# Patient Record
Sex: Male | Born: 1965 | Race: White | Hispanic: No | Smoking: Former smoker
Health system: Southern US, Community
[De-identification: ages and names within clinical notes are randomized; demographics above are authoritative.]

## PROBLEM LIST (undated history)

## (undated) DIAGNOSIS — K219 Gastro-esophageal reflux disease without esophagitis: Secondary | ICD-10-CM

## (undated) DIAGNOSIS — E119 Type 2 diabetes mellitus without complications: Secondary | ICD-10-CM

## (undated) DIAGNOSIS — Z9289 Personal history of other medical treatment: Secondary | ICD-10-CM

## (undated) DIAGNOSIS — E785 Hyperlipidemia, unspecified: Secondary | ICD-10-CM

## (undated) DIAGNOSIS — Z87891 Personal history of nicotine dependence: Secondary | ICD-10-CM

## (undated) DIAGNOSIS — R748 Abnormal levels of other serum enzymes: Secondary | ICD-10-CM

## (undated) DIAGNOSIS — I10 Essential (primary) hypertension: Secondary | ICD-10-CM

## (undated) DIAGNOSIS — I70219 Atherosclerosis of native arteries of extremities with intermittent claudication, unspecified extremity: Secondary | ICD-10-CM

## (undated) DIAGNOSIS — I70201 Unspecified atherosclerosis of native arteries of extremities, right leg: Secondary | ICD-10-CM

## (undated) DIAGNOSIS — K5190 Ulcerative colitis, unspecified, without complications: Secondary | ICD-10-CM

## (undated) DIAGNOSIS — N529 Male erectile dysfunction, unspecified: Secondary | ICD-10-CM

## (undated) DIAGNOSIS — I739 Peripheral vascular disease, unspecified: Secondary | ICD-10-CM

## (undated) HISTORY — PX: TONSILLECTOMY: SUR1361

## (undated) HISTORY — PX: FOOT SURGERY: SHX648

## (undated) HISTORY — PX: OTHER SURGICAL HISTORY: SHX169

## (undated) HISTORY — PX: COLONOSCOPY: SHX174

## (undated) HISTORY — PX: HERNIA REPAIR: SHX51

## (undated) HISTORY — PX: VASCULAR SURGERY: SHX849

---

## 2006-04-25 ENCOUNTER — Emergency Department: Payer: Self-pay | Admitting: Emergency Medicine

## 2008-02-07 ENCOUNTER — Ambulatory Visit: Payer: Self-pay | Admitting: Internal Medicine

## 2008-02-11 ENCOUNTER — Ambulatory Visit: Payer: Self-pay | Admitting: Family Medicine

## 2009-04-18 ENCOUNTER — Emergency Department: Payer: Self-pay | Admitting: Emergency Medicine

## 2011-09-20 ENCOUNTER — Ambulatory Visit: Payer: Self-pay | Admitting: Internal Medicine

## 2013-05-03 DIAGNOSIS — Z720 Tobacco use: Secondary | ICD-10-CM | POA: Insufficient documentation

## 2014-12-10 ENCOUNTER — Emergency Department
Admit: 2014-12-10 | Disposition: A | Discharge status: Home / Self Care | Payer: Self-pay | Admitting: Emergency Medicine

## 2015-10-02 ENCOUNTER — Emergency Department
Admission: EM | Admit: 2015-10-02 | Discharge: 2015-10-02 | Disposition: A | Discharge status: Home / Self Care | Payer: BC Managed Care – PPO | Attending: Emergency Medicine | Admitting: Emergency Medicine

## 2015-10-02 ENCOUNTER — Encounter: Payer: Self-pay | Admitting: Emergency Medicine

## 2015-10-02 DIAGNOSIS — M542 Cervicalgia: Secondary | ICD-10-CM | POA: Insufficient documentation

## 2015-10-02 DIAGNOSIS — F17200 Nicotine dependence, unspecified, uncomplicated: Secondary | ICD-10-CM | POA: Diagnosis not present

## 2015-10-02 DIAGNOSIS — E119 Type 2 diabetes mellitus without complications: Secondary | ICD-10-CM | POA: Insufficient documentation

## 2015-10-02 HISTORY — DX: Type 2 diabetes mellitus without complications: E11.9

## 2015-10-02 MED ORDER — PREDNISONE 10 MG (21) PO TBPK: ORAL_TABLET | ORAL | Status: DC

## 2015-10-02 MED ORDER — HYDROCODONE-ACETAMINOPHEN 5-325 MG PO TABS: 1.0000 | ORAL_TABLET | ORAL | Status: DC | PRN

## 2015-10-02 MED ORDER — CYCLOBENZAPRINE HCL 10 MG PO TABS: 10.0000 mg | ORAL_TABLET | Freq: Three times a day (TID) | ORAL | Status: DC | PRN
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
## 2017-03-05 DIAGNOSIS — IMO0002 Reserved for concepts with insufficient information to code with codable children: Secondary | ICD-10-CM | POA: Insufficient documentation

## 2018-01-05 DIAGNOSIS — N528 Other male erectile dysfunction: Secondary | ICD-10-CM | POA: Insufficient documentation

## 2018-01-05 DIAGNOSIS — E1142 Type 2 diabetes mellitus with diabetic polyneuropathy: Secondary | ICD-10-CM | POA: Insufficient documentation

## 2019-05-19 ENCOUNTER — Other Ambulatory Visit: Payer: Self-pay

## 2019-05-19 ENCOUNTER — Encounter (INDEPENDENT_AMBULATORY_CARE_PROVIDER_SITE_OTHER): Payer: Self-pay | Admitting: Vascular Surgery

## 2019-05-19 ENCOUNTER — Ambulatory Visit (INDEPENDENT_AMBULATORY_CARE_PROVIDER_SITE_OTHER): Payer: BC Managed Care – PPO | Admitting: Vascular Surgery

## 2019-05-19 VITALS — BP 130/81 | HR 94 | Resp 16 | Ht 68.0 in | Wt 164.4 lb

## 2019-05-19 DIAGNOSIS — E785 Hyperlipidemia, unspecified: Secondary | ICD-10-CM | POA: Insufficient documentation

## 2019-05-19 DIAGNOSIS — E782 Mixed hyperlipidemia: Secondary | ICD-10-CM

## 2019-05-19 DIAGNOSIS — I743 Embolism and thrombosis of arteries of the lower extremities: Secondary | ICD-10-CM | POA: Diagnosis not present

## 2019-05-19 DIAGNOSIS — I70221 Atherosclerosis of native arteries of extremities with rest pain, right leg: Secondary | ICD-10-CM

## 2019-05-19 DIAGNOSIS — E1142 Type 2 diabetes mellitus with diabetic polyneuropathy: Secondary | ICD-10-CM

## 2019-05-19 DIAGNOSIS — I70229 Atherosclerosis of native arteries of extremities with rest pain, unspecified extremity: Secondary | ICD-10-CM | POA: Insufficient documentation

## 2019-05-19 DIAGNOSIS — E1169 Type 2 diabetes mellitus with other specified complication: Secondary | ICD-10-CM | POA: Insufficient documentation

## 2019-05-19 NOTE — Progress Notes (Signed)
MRN : [id number]  Justin Adkins is a 53 y.o. (Jan 11, 1966) male who presents with chief complaint of  Chief Complaint  Patient presents with  . New Patient (Initial Visit)    ref White for right calf pain  .  History of Present Illness:  The patient is seen for evaluation of painful right lower extremity. Patient notes the pain is always worse with activity and is very consistent day today. Typically, the pain occurs at less than one block, progress is as activity continues to the point that the patient must stop walking. Resting including standing still for several minutes allowed resumption of the activity and the ability to walk a similar distance before stopping again. Uneven terrain and inclined shorten the distance. The pain has been progressive over the past several years. The patient states the inability to walk is now having a profound negative impact on quality of life and daily activities.  The patient denies rest pain or dangling of an extremity off the side of the bed during the night for relief. No open wounds or sores at this time. No prior interventions or surgeries.  No history of back problems or DJD of the lumbar sacral spine.   The patient denies changes in claudication symptoms or new rest pain symptoms.  No new ulcers or wounds of the foot.  The patient's blood pressure has been stable and relatively well controlled. The patient denies amaurosis fugax or recent TIA symptoms. There are no recent neurological changes noted. The patient denies history of DVT, PE or superficial thrombophlebitis. The patient denies recent episodes of angina or shortness of breath.   Current Meds  Medication Sig  . aspirin EC 81 MG tablet Take by mouth.  Marland Kitchen atorvastatin (LIPITOR) 40 MG tablet Take 40 mg by mouth daily.  . dapagliflozin propanediol (FARXIGA) 10 MG TABS tablet Take by mouth.  . Insulin Glargine (LANTUS SOLOSTAR) 100 UNIT/ML Solostar Pen Inject into the skin daily.  .  Insulin Pen Needle (FIFTY50 PEN NEEDLES) 31G X 5 MM MISC Use as directed  . LEVEMIR FLEXTOUCH 100 UNIT/ML Pen INJECT 25 UNITS SUBCUTANEOUSLY NIGHTLY  . meloxicam (MOBIC) 15 MG tablet Take 15 mg by mouth daily.  . metFORMIN (GLUCOPHAGE) 1000 MG tablet Take 1,000 mg by mouth 2 (two) times daily with a meal.    Past Medical History:  Diagnosis Date  . Diabetes mellitus without complication [facility])     Past Surgical History:  Procedure Laterality Date  . TONSILLECTOMY      Social History Social History   Tobacco Use  . Smoking status: Heavy Tobacco Smoker  . Smokeless tobacco: Never Used  Substance Use Topics  . Alcohol use: Yes  . Drug use: No    Family History Family History  Problem Relation Age of Onset  . Cancer Father   . Heart attack Brother   No family history of bleeding/clotting disorders, porphyria or autoimmune disease   Allergies  Allergen Reactions  . Naproxen Anaphylaxis  . Liraglutide Nausea Only     REVIEW OF SYSTEMS (Negative unless checked)  Constitutional: [] Weight loss  [] Fever  [] Chills Cardiac: [] Chest pain   [] Chest pressure   [] Palpitations   [] Shortness of breath when laying flat   [] Shortness of breath with exertion. Vascular:  [x] Pain in legs with walking   [x] Pain in legs at rest  [] History of DVT   [] Phlebitis   [] Swelling in legs   [] Varicose veins   [] Non-healing ulcers Pulmonary:   [] Uses  home oxygen   [] Productive cough   [] Hemoptysis   [] Wheeze  [] COPD   [] Asthma Neurologic:  [] Dizziness   [] Seizures   [] History of stroke   [] History of TIA  [] Aphasia   [] Vissual changes   [] Weakness or numbness in arm   [] Weakness or numbness in leg Musculoskeletal:   [] Joint swelling   [] Joint pain   [] Low back pain Hematologic:  [] Easy bruising  [] Easy bleeding   [] Hypercoagulable state   [] Anemic Gastrointestinal:  [] Diarrhea   [] Vomiting  [] Gastroesophageal reflux/heartburn   [] Difficulty swallowing. Genitourinary:  [] Chronic kidney disease    [] Difficult urination  [] Frequent urination   [] Blood in urine Skin:  [] Rashes   [] Ulcers  Psychological:  [] History of anxiety   []  History of major depression.  Physical Examination  Vitals:   05/19/19 1454  BP: 130/81  Pulse: 94  Resp: 16  Weight: 164 lb 6.4 oz (74.6 kg)  Height: 5\' 8"  (1.727 m)   Body mass index is 25 kg/m. Gen: WD/WN, NAD Head: [employer]/AT, No temporalis wasting.  Ear/Nose/Throat: Hearing grossly intact, nares w/o erythema or drainage, poor dentition Eyes: PER, EOMI, sclera nonicteric.  Neck: Supple, no masses.  No bruit or JVD.  Pulmonary:  Good air movement, clear to auscultation bilaterally, no use of accessory muscles.  Cardiac: RRR, normal S1, S2, no Murmurs. Vascular: right foot is cool and mildly cyanotic with sluggish cap refill Vessel Right Left  Radial Palpable Palpable  PT Not Palpable Palpable  DP Not Palpable Palpable  Gastrointestinal: soft, non-distended. No guarding/no peritoneal signs.  Musculoskeletal: M/S 5/5 throughout.  No deformity or atrophy.  Neurologic: CN 2-12 intact. Pain and light touch intact in extremities.  Symmetrical.  Speech is fluent. Motor exam as listed above. Psychiatric: Judgment intact, Mood & affect appropriate for pt's clinical situation. Dermatologic: No rashes or ulcers noted.  No changes consistent with cellulitis. Lymph : No Cervical lymphadenopathy, no lichenification or skin changes of chronic lymphedema.  CBC No results found for: WBC, HGB, HCT, MCV, PLT  BMET No results found for: NA, K, CL, CO2, GLUCOSE, BUN, CREATININE, CALCIUM, GFRNONAA, GFRAA CrCl cannot be calculated (No successful lab value found.).  COAG No results found for: INR, PROTIME  Radiology No results found.   Assessment/Plan 1. Atherosclerosis of native artery of right lower extremity with rest pain (HCC) Recommend:  The patient has evidence of severe atherosclerotic changes of both lower extremities with right leg rest pain that is  associated with preulcerative changes and impending tissue loss of the right foot.  This represents a limb threatening ischemia and places the patient at the risk for right limb loss.  I will order a STAT CT angiogram to identify the problem and exclude the possibility of aneurysmal disease.  Once obtained I would move forward with conventional angiography.  Patient should undergo angiography of the right lower extremities with the hope for intervention for limb salvage.  The risks and benefits as well as the alternative therapies was discussed in detail with the patient.  All questions were answered.  Patient agrees to proceed with angiography.  The patient will follow up with me in the office after the procedure.   2. Mixed hyperlipidemia Continue statin as ordered and reviewed, no changes at this time   3. Type 2 diabetes mellitus with peripheral neuropathy (HCC) Continue hypoglycemic medications as already ordered, these medications have been reviewed and there are no changes at this time.  Hgb A1C to be monitored as already arranged by primary  service   4. 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

## 2019-05-20 ENCOUNTER — Ambulatory Visit
Admission: RE | Admit: 2019-05-20 | Discharge: 2019-05-20 | Disposition: A | Discharge status: Home / Self Care | Payer: BC Managed Care – PPO | Source: Ambulatory Visit | Attending: Vascular Surgery | Admitting: Vascular Surgery

## 2019-05-20 ENCOUNTER — Other Ambulatory Visit (INDEPENDENT_AMBULATORY_CARE_PROVIDER_SITE_OTHER): Payer: Self-pay | Admitting: Vascular Surgery

## 2019-05-20 ENCOUNTER — Telehealth (INDEPENDENT_AMBULATORY_CARE_PROVIDER_SITE_OTHER): Payer: Self-pay

## 2019-05-20 DIAGNOSIS — I70201 Unspecified atherosclerosis of native arteries of extremities, right leg: Secondary | ICD-10-CM | POA: Insufficient documentation

## 2019-05-20 DIAGNOSIS — I743 Embolism and thrombosis of arteries of the lower extremities: Secondary | ICD-10-CM | POA: Diagnosis not present

## 2019-05-20 LAB — POCT I-STAT CREATININE: Creatinine, Ser: 0.6 mg/dL — ABNORMAL LOW (ref 0.61–1.24)

## 2019-05-20 MED ORDER — IOHEXOL 350 MG/ML SOLN
125.0000 mL | Freq: Once | INTRAVENOUS | Status: AC | PRN
  Administered 2019-05-20: 125 mL via INTRAVENOUS

## 2019-05-20 NOTE — Telephone Encounter (Signed)
Spoke with the patient and he is scheduled with Dr. Delana Meyer for a RLE angio on 05/25/2019 with a 9:00 am arrival time to the [facility]. Patient will do his Covid testing on 05/23/2019 before 11:00 am at the [facility]. This will be mailed to the patient.

## 2019-05-23 ENCOUNTER — Other Ambulatory Visit: Payer: Self-pay

## 2019-05-23 ENCOUNTER — Other Ambulatory Visit
Admission: RE | Admit: 2019-05-23 | Discharge: 2019-05-23 | Disposition: A | Discharge status: Home / Self Care | Payer: BC Managed Care – PPO | Source: Ambulatory Visit | Attending: Vascular Surgery | Admitting: Vascular Surgery

## 2019-05-23 DIAGNOSIS — Z01812 Encounter for preprocedural laboratory examination: Secondary | ICD-10-CM | POA: Diagnosis not present

## 2019-05-23 DIAGNOSIS — Z20828 Contact with and (suspected) exposure to other viral communicable diseases: Secondary | ICD-10-CM | POA: Insufficient documentation

## 2019-05-23 LAB — SARS CORONAVIRUS 2 (TAT 6-24 HRS): SARS Coronavirus 2: NEGATIVE

## 2019-05-24 ENCOUNTER — Other Ambulatory Visit (INDEPENDENT_AMBULATORY_CARE_PROVIDER_SITE_OTHER): Payer: Self-pay | Admitting: Nurse Practitioner

## 2019-05-24 MED ORDER — CEFAZOLIN SODIUM-DEXTROSE 2-4 GM/100ML-% IV SOLN
2.0000 g | Freq: Once | INTRAVENOUS | Status: AC
  Administered 2019-05-25: 2 g via INTRAVENOUS

## 2019-05-25 ENCOUNTER — Other Ambulatory Visit: Payer: Self-pay

## 2019-05-25 ENCOUNTER — Encounter
Admission: RE | Disposition: A | Discharge status: Home / Self Care | Payer: Self-pay | Source: Ambulatory Visit | Attending: Vascular Surgery

## 2019-05-25 ENCOUNTER — Encounter: Payer: Self-pay | Admitting: *Deleted

## 2019-05-25 ENCOUNTER — Ambulatory Visit
Admission: RE | Admit: 2019-05-25 | Discharge: 2019-05-25 | Disposition: A | Discharge status: Home / Self Care | Payer: BC Managed Care – PPO | Source: Ambulatory Visit | Attending: Vascular Surgery | Admitting: Vascular Surgery

## 2019-05-25 DIAGNOSIS — I743 Embolism and thrombosis of arteries of the lower extremities: Secondary | ICD-10-CM | POA: Diagnosis not present

## 2019-05-25 DIAGNOSIS — Z794 Long term (current) use of insulin: Secondary | ICD-10-CM | POA: Diagnosis not present

## 2019-05-25 DIAGNOSIS — I70221 Atherosclerosis of native arteries of extremities with rest pain, right leg: Secondary | ICD-10-CM

## 2019-05-25 DIAGNOSIS — I70201 Unspecified atherosclerosis of native arteries of extremities, right leg: Secondary | ICD-10-CM

## 2019-05-25 DIAGNOSIS — E782 Mixed hyperlipidemia: Secondary | ICD-10-CM | POA: Insufficient documentation

## 2019-05-25 DIAGNOSIS — F17200 Nicotine dependence, unspecified, uncomplicated: Secondary | ICD-10-CM | POA: Diagnosis not present

## 2019-05-25 DIAGNOSIS — Z79899 Other long term (current) drug therapy: Secondary | ICD-10-CM | POA: Diagnosis not present

## 2019-05-25 DIAGNOSIS — Z7982 Long term (current) use of aspirin: Secondary | ICD-10-CM | POA: Insufficient documentation

## 2019-05-25 DIAGNOSIS — E1140 Type 2 diabetes mellitus with diabetic neuropathy, unspecified: Secondary | ICD-10-CM | POA: Diagnosis not present

## 2019-05-25 HISTORY — PX: LOWER EXTREMITY ANGIOGRAPHY: CATH118251

## 2019-05-25 LAB — GLUCOSE, CAPILLARY
Glucose-Capillary: 386 mg/dL — ABNORMAL HIGH (ref 70–99)
Glucose-Capillary: 327 mg/dL — ABNORMAL HIGH (ref 70–99)
Glucose-Capillary: 264 mg/dL — ABNORMAL HIGH (ref 70–99)
Glucose-Capillary: 170 mg/dL — ABNORMAL HIGH (ref 70–99)

## 2019-05-25 LAB — BASIC METABOLIC PANEL
Anion gap: 11 (ref 5–15)
BUN: 18 mg/dL (ref 6–20)
CO2: 23 mmol/L (ref 22–32)
Calcium: 9.5 mg/dL (ref 8.9–10.3)
Chloride: 102 mmol/L (ref 98–111)
Creatinine, Ser: 0.65 mg/dL (ref 0.61–1.24)
GFR calc Af Amer: >60 mL/min (ref >60)
GFR calc non Af Amer: >60 mL/min (ref >60)
Glucose, Bld: 378 mg/dL — ABNORMAL HIGH (ref 70–99)
Potassium: 4.5 mmol/L (ref 3.5–5.1)
Sodium: 136 mmol/L (ref 135–145)

## 2019-05-25 SURGERY — LOWER EXTREMITY ANGIOGRAPHY
Anesthesia: Moderate Sedation | Laterality: Right

## 2019-05-25 MED ORDER — FAMOTIDINE 20 MG PO TABS: 40.0000 mg | ORAL_TABLET | Freq: Once | ORAL | Status: DC | PRN

## 2019-05-25 MED ORDER — CEFAZOLIN SODIUM-DEXTROSE 2-4 GM/100ML-% IV SOLN
INTRAVENOUS | Status: AC
  Filled 2019-05-25: qty 100

## 2019-05-25 MED ORDER — ONDANSETRON HCL 4 MG/2ML IJ SOLN: 4.0000 mg | Freq: Four times a day (QID) | INTRAMUSCULAR | Status: DC | PRN

## 2019-05-25 MED ORDER — SODIUM CHLORIDE FLUSH 0.9 % IV SOLN
INTRAVENOUS | Status: AC
  Filled 2019-05-25: qty 10

## 2019-05-25 MED ORDER — SODIUM CHLORIDE 0.9% FLUSH: 3.0000 mL | Freq: Two times a day (BID) | INTRAVENOUS | Status: DC

## 2019-05-25 MED ORDER — MORPHINE SULFATE (PF) 4 MG/ML IV SOLN: 2.0000 mg | INTRAVENOUS | Status: DC | PRN

## 2019-05-25 MED ORDER — METHYLPREDNISOLONE SODIUM SUCC 125 MG IJ SOLR: 125.0000 mg | Freq: Once | INTRAMUSCULAR | Status: DC | PRN

## 2019-05-25 MED ORDER — MIDAZOLAM HCL 5 MG/5ML IJ SOLN
INTRAMUSCULAR | Status: AC
  Filled 2019-05-25: qty 5

## 2019-05-25 MED ORDER — CLOPIDOGREL BISULFATE 75 MG PO TABS
ORAL_TABLET | ORAL | Status: AC
  Administered 2019-05-25: 13:00:00 300 mg via ORAL
  Filled 2019-05-25: qty 4

## 2019-05-25 MED ORDER — HYDROMORPHONE HCL 1 MG/ML IJ SOLN: 1.0000 mg | Freq: Once | INTRAMUSCULAR | Status: DC | PRN

## 2019-05-25 MED ORDER — FENTANYL CITRATE (PF) 100 MCG/2ML IJ SOLN
INTRAMUSCULAR | Status: AC
  Filled 2019-05-25: qty 2

## 2019-05-25 MED ORDER — ACETAMINOPHEN 325 MG PO TABS: 650.0000 mg | ORAL_TABLET | ORAL | Status: DC | PRN

## 2019-05-25 MED ORDER — ATORVASTATIN CALCIUM 10 MG PO TABS: 10.0000 mg | ORAL_TABLET | Freq: Every day | ORAL | 1 refills | Status: DC

## 2019-05-25 MED ORDER — FENTANYL CITRATE (PF) 100 MCG/2ML IJ SOLN
INTRAMUSCULAR | Status: DC | PRN
  Administered 2019-05-25 (×2): 25 ug via INTRAVENOUS
  Administered 2019-05-25 (×2): 50 ug via INTRAVENOUS
  Administered 2019-05-25: 25 ug via INTRAVENOUS

## 2019-05-25 MED ORDER — HEPARIN SODIUM (PORCINE) 1000 UNIT/ML IJ SOLN
INTRAMUSCULAR | Status: DC | PRN
  Administered 2019-05-25: 5000 [IU] via INTRAVENOUS

## 2019-05-25 MED ORDER — INSULIN ASPART 100 UNIT/ML ~~LOC~~ SOLN
SUBCUTANEOUS | Status: AC
  Administered 2019-05-25: 12 [IU] via SUBCUTANEOUS
  Filled 2019-05-25: qty 1

## 2019-05-25 MED ORDER — MIDAZOLAM HCL 2 MG/ML PO SYRP: 8.0000 mg | ORAL_SOLUTION | Freq: Once | ORAL | Status: DC | PRN

## 2019-05-25 MED ORDER — APIXABAN 2.5 MG PO TABS: 2.5000 mg | ORAL_TABLET | Freq: Two times a day (BID) | ORAL | 4 refills | Status: DC

## 2019-05-25 MED ORDER — OXYCODONE HCL 5 MG PO TABS: 5.0000 mg | ORAL_TABLET | ORAL | Status: DC | PRN

## 2019-05-25 MED ORDER — ALTEPLASE 2 MG IJ SOLR
INTRAMUSCULAR | Status: DC | PRN
  Administered 2019-05-25: 6 mg

## 2019-05-25 MED ORDER — HEPARIN SODIUM (PORCINE) 1000 UNIT/ML IJ SOLN
INTRAMUSCULAR | Status: AC
  Filled 2019-05-25: qty 1

## 2019-05-25 MED ORDER — CLOPIDOGREL BISULFATE 300 MG PO TABS
300.0000 mg | ORAL_TABLET | ORAL | Status: AC
  Administered 2019-05-25: 13:00:00 300 mg via ORAL

## 2019-05-25 MED ORDER — SODIUM CHLORIDE 0.9 % IV SOLN: INTRAVENOUS | Status: DC

## 2019-05-25 MED ORDER — IODIXANOL 320 MG/ML IV SOLN
INTRAVENOUS | Status: DC | PRN
  Administered 2019-05-25: 12:00:00 55 mL via INTRA_ARTERIAL

## 2019-05-25 MED ORDER — MIDAZOLAM HCL 2 MG/2ML IJ SOLN
INTRAMUSCULAR | Status: DC | PRN
  Administered 2019-05-25 (×4): 1 mg via INTRAVENOUS
  Administered 2019-05-25: 2 mg via INTRAVENOUS

## 2019-05-25 MED ORDER — APIXABAN 2.5 MG PO TABS
2.5000 mg | ORAL_TABLET | ORAL | Status: AC
  Administered 2019-05-25: 13:00:00 2.5 mg via ORAL
  Filled 2019-05-25: qty 1

## 2019-05-25 MED ORDER — SODIUM CHLORIDE 0.9 % IV SOLN
INTRAVENOUS | Status: DC
  Administered 2019-05-25: 10:00:00 via INTRAVENOUS

## 2019-05-25 MED ORDER — CLOPIDOGREL BISULFATE 75 MG PO TABS: 75.0000 mg | ORAL_TABLET | Freq: Every day | ORAL | 11 refills | Status: DC

## 2019-05-25 MED ORDER — SODIUM CHLORIDE 0.9 % IV SOLN: 250.0000 mL | INTRAVENOUS | Status: DC | PRN

## 2019-05-25 MED ORDER — HYDRALAZINE HCL 20 MG/ML IJ SOLN: 5.0000 mg | INTRAMUSCULAR | Status: DC | PRN

## 2019-05-25 MED ORDER — INSULIN ASPART 100 UNIT/ML ~~LOC~~ SOLN
12.0000 [IU] | Freq: Once | SUBCUTANEOUS | Status: AC
  Administered 2019-05-25: 09:00:00 12 [IU] via SUBCUTANEOUS

## 2019-05-25 MED ORDER — LABETALOL HCL 5 MG/ML IV SOLN: 10.0000 mg | INTRAVENOUS | Status: DC | PRN

## 2019-05-25 MED ORDER — SODIUM CHLORIDE 0.9% FLUSH: 3.0000 mL | INTRAVENOUS | Status: DC | PRN

## 2019-05-25 MED ORDER — INSULIN ASPART 100 UNIT/ML ~~LOC~~ SOLN
12.0000 [IU] | Freq: Once | SUBCUTANEOUS | Status: AC
  Administered 2019-05-25: 10:00:00 12 [IU] via SUBCUTANEOUS

## 2019-05-25 MED ORDER — DIPHENHYDRAMINE HCL 50 MG/ML IJ SOLN: 50.0000 mg | Freq: Once | INTRAMUSCULAR | Status: DC | PRN

## 2019-05-25 SURGICAL SUPPLY — 24 items
BALLN LUTONIX 018 5X60X130 (BALLOONS) ×3
BALLN ULTRASCORE 014 3X40X150 (BALLOONS) ×3
BALLOON LUTONIX 018 5X60X130 (BALLOONS) ×1 IMPLANT
BALLOON ULTRSCRE 014 3X40X150 (BALLOONS) ×1 IMPLANT
CANISTER PENUMBRA ENGINE (MISCELLANEOUS) ×3 IMPLANT
CATH BEACON 5 .035 65 RIM TIP (CATHETERS) ×3 IMPLANT
CATH INDIGO CAT6 KIT (CATHETERS) ×3 IMPLANT
CATH INFUS 135CMX30CM (CATHETERS) ×3 IMPLANT
CATH PIG 70CM (CATHETERS) ×3 IMPLANT
CATH VERT 5FR 125CM (CATHETERS) ×3 IMPLANT
DEVICE PRESTO INFLATION (MISCELLANEOUS) ×3 IMPLANT
DEVICE STARCLOSE SE CLOSURE (Vascular Products) ×3 IMPLANT
DEVICE TORQUE .025-.038 (MISCELLANEOUS) ×3 IMPLANT
GLIDEWIRE ADV .035X260CM (WIRE) ×3 IMPLANT
NEEDLE ENTRY 21GA 7CM ECHOTIP (NEEDLE) ×3 IMPLANT
PACK ANGIOGRAPHY (CUSTOM PROCEDURE TRAY) ×3 IMPLANT
SET INTRO CAPELLA COAXIAL (SET/KITS/TRAYS/PACK) ×3 IMPLANT
SHEATH BRITE TIP 5FRX11 (SHEATH) ×3 IMPLANT
SHEATH PINNACLE MP 7F 45CM (SHEATH) ×3 IMPLANT
SYR MEDRAD MARK 7 150ML (SYRINGE) ×3 IMPLANT
TUBING CONTRAST HIGH PRESS 72 (TUBING) ×3 IMPLANT
VALVE HEMO TOUHY BORST Y (ADAPTER) ×3 IMPLANT
WIRE J 3MM .035X145CM (WIRE) ×3 IMPLANT
WIRE RUNTHROUGH .014X300CM (WIRE) ×3 IMPLANT

## 2019-05-25 NOTE — Discharge Instructions (Signed)

## 2019-05-25 NOTE — OR Nursing (Signed)
Pt ready for discharge at 1415 but delay due to waiting for post procedure discussion with Dr Delana Meyer.

## 2019-05-25 NOTE — OR Nursing (Signed)
Dr schnier notified of fsbs 386 order receved for 12 novolog with repeat fsbs in one hr

## 2019-05-25 NOTE — OR Nursing (Signed)
Dr Delana Meyer notified of fsbs 1 hr post insulin of 326. 12 units insulin ordered.

## 2019-05-25 NOTE — H&P (Signed)
[facility] History & Physical Update  The patient was interviewed and re-examined.  The patient's previous History and Physical has been reviewed and is unchanged.  There is no change in the plan of care. We plan to proceed with the scheduled procedure.  Hortencia Pilar, MD  05/25/2019, 10:47 AM

## 2019-05-25 NOTE — Op Note (Signed)
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 Introduction catheter into right lower extremity 3rd order catheter placement  2. Contrast injection right lower extremity for distal runoff  3. Percutaneous transluminal angioplasty right  popliteal arteries to 5 mm 4. Percutaneous transluminal angioplasty right anterior tibial artery to 3 mm  5. Infusion of 6 milligrams of TPA right popliteal artery              6.  Mechanical thrombectomy with the penumbra CAT 6 device right popliteal artery.              7.  Mechanical thrombectomy of the right tibioperoneal trunk and proximal peroneal with the penumbra CAT 6 device.              8.  Star close closure left common femoral arteriotomy  Anesthesia: Conscious sedation was administered under my direct supervision by the interventional radiology RN. IV Versed plus fentanyl were utilized. Continuous ECG, pulse oximetry and blood pressure was monitored throughout the entire procedure.  Conscious sedation was for a total of 1 hour 50 minutes.  Sheath: 6 French destination left common femoral retrograde  Contrast: 55 cc  Fluoroscopy Time: 12.9 minutes  Indications: Justin Adkins presents with increasing pain of the right lower extremity.  Physical examination showed ischemic changes to the great toe noninvasive studies demonstrated severe occlusive disease.  This suggests the patient is having limb threatening ischemia placing his foot at risk for limb loss.  CT angiography was performed given the possibility that this could be embolic which demonstrated normal aortic iliac arterial flow no evidence of  aneurysmal diseases.  The risks and benefits are reviewed all questions answered patient agrees to proceed.  Procedure:Justin Adkins is a 53 y.o. y.o. male who was identified and appropriate procedural time out was performed. The patient was then placed supine on the table and prepped and draped in the usual sterile fashion.   Ultrasound was placed in the sterile sleeve and the left groin was evaluated the left common femoral artery was echolucent and pulsatile indicating patency. Image was recorded for the permanent record and under real-time visualization a microneedle was inserted into the common femoral artery followed by the microwire and then the micro-sheath. A J-wire was then advanced through the micro-sheath and a 5 [place] sheath was then inserted over a J-wire.  Given that the CT angiogram demonstrated essentially normal aorta and iliac system I moved forward with crossing the aortic bifurcation.  J-wire was then advanced and a 5 French rim catheter was to cross the aortic bifurcation the catheter wire were advanced down into the right distal external iliac artery. Oblique view of the femoral bifurcation was then obtained and subsequently the wire was reintroduced and the pigtail catheter negotiated into the SFA representing third order catheter placement. Distal runoff was then performed.  Diagnostic interpretation: Images from the CT angiography of the distal right lower extremity distal runoff were not adequate given the occlusion of the popliteal noted the tibial anatomy was poorly visualized.  RAO view of the femoral bifurcation demonstrated the common femoral and the profunda femoris as well as the SFA were widely patent.  The mid popliteal demonstrated a 4 cm occlusion.  There is reconstitution of the distal popliteal but there is moderate disease within  the tibioperoneal trunk approximately 40 to 50%.  There is scattered disease throughout the posterior tibial although there are no  hemodynamically significant lesions noted.  The peroneal is somewhat small and does not collateralize particularly well distally.  Anterior tibial demonstrates greater than 70% stenosis at its origin with tandem lesions.  Distal to that the anterior tibial is widely patent and fills the dorsalis pedis and the pedal arch.  Posterior tibial fills the lateral and medial plantar vessels as well as the pedal arch.  Given these findings I elected to move forward with intervention for limb salvage.  5000 units of heparin was then given and allowed to circulate and a 6 [place] destination sheath was advanced up and over the bifurcation and positioned in the proximal superficial femoral artery  Angled catheter and advantage Glidewire were then negotiated down into the distal popliteal. Catheter was then advanced. Hand injection contrast demonstrated intraluminal positioning and the tibial anatomy in detail.    The wires then exchanged for a 0.014 wire and with a Touhy Borst 6 mg of TPA is reconstituted and then laced across the clot.  Approximately 30 minutes is then allowed for dwell time.  Follow-up imaging by hand-injection demonstrates there is now trickle flow through the occlusion.  I elected to prep the penumbra CAT 6 device on the field and several passes are now made with the CAT 6 through the popliteal.  Several large pieces of semi-organized thrombus are retrieved.  Follow-up imaging now demonstrates approximately 70 to 80% stenosis which is very focal approximately 10 to 15 mm in length.  A 5 mm x 60 mm Lutonix drug-eluting balloon is advanced across this lesion and inflated to 10 atm for 2 full minutes.  Follow-up imaging demonstrates less than 5% residual stenosis however there appears to be thrombus which has occluded the tibioperoneal trunk and peroneal.  The wire had been negotiated down the anterior tibial and I elected to treat this lesion a 3 mm x 40 mm ultra score balloon is advanced across the  proximal anterior tibial inflations to 10 atm for 1 minute.  Follow-up imaging demonstrates less than 15% residual stenosis with rapid flow of contrast and preservation of distal runoff.  Having successfully treated both the popliteal lesion as well as the anterior tibial I now pulled the wire back and with the aid of a angled catheter negotiated the wire into the peroneal.  Catheter is then advanced and hand-injection contrast confirms intraluminal positioning.  The penumbra CAT 6 is then advanced again down the wire and used to perform thrombectomy of the tibioperoneal trunk and peroneal.  Again several large pieces of semi-organized thrombus are retrieved.  Follow-up imaging now demonstrates wide patency of the tibioperoneal trunk peroneal and posterior tibial.  As noted above the plaque within the tibioperoneal trunk does not reach hemodynamic significance and therefore angioplasty at this level was not performed.  After review of these images the sheath is pulled into the left external iliac oblique of the common femoral is obtained and a Star close device deployed. There no immediate Complications.  Findings:  Images from the CT angiography of the distal right lower extremity distal runoff were not adequate given the occlusion of the popliteal noted the tibial anatomy was poorly visualized.  RAO view of the femoral bifurcation demonstrated the common femoral and the profunda femoris as well as the SFA were widely patent.  The mid popliteal demonstrated a 4 cm occlusion.  There is reconstitution of the distal popliteal  but there is moderate disease within the tibioperoneal trunk approximately 40 to 50%.  There is scattered disease throughout the posterior tibial although there are no hemodynamically significant lesions noted.  The peroneal is somewhat small and does not collateralize particularly well distally.  Anterior tibial demonstrates greater than 70% stenosis at its origin with tandem lesions.   Distal to that the anterior tibial is widely patent and fills the dorsalis pedis and the pedal arch.  Posterior tibial fills the lateral and medial plantar vessels as well as the pedal arch.  Following administration of TPA and mechanical thrombectomy of the popliteal there is now recanalization and uncovering a culprit lesion.  Following angioplasty of the popliteal to 5 mm there is now resolution of this lesion with less than 5% residual stenosis.  Following angioplasty anterior tibial now is in-line flow and looks quite nice with less than 15% residual stenosis  Catheter was then negotiated into the peroneal, additional third order catheter placement and mechanical thrombectomy performed of the tibioperoneal trunk and peroneal with successful recanalization and removal of the thromboembolism.  Summary: Successful recanalization right lower extremity for limb salvage   Disposition: Patient was taken to the recovery room in stable condition having tolerated the procedure well.  Belenda Cruise Schnier 05/25/2019,4:59 PM

## 2019-05-26 ENCOUNTER — Encounter: Payer: Self-pay | Admitting: Vascular Surgery

## 2019-05-31 ENCOUNTER — Other Ambulatory Visit (INDEPENDENT_AMBULATORY_CARE_PROVIDER_SITE_OTHER): Payer: Self-pay | Admitting: Nurse Practitioner

## 2019-05-31 ENCOUNTER — Telehealth (INDEPENDENT_AMBULATORY_CARE_PROVIDER_SITE_OTHER): Payer: Self-pay

## 2019-05-31 DIAGNOSIS — M79671 Pain in right foot: Secondary | ICD-10-CM

## 2019-05-31 DIAGNOSIS — I739 Peripheral vascular disease, unspecified: Secondary | ICD-10-CM | POA: Insufficient documentation

## 2019-05-31 NOTE — Telephone Encounter (Signed)
He should come in with a R leg arterial and an ABI.  Some pain is common after this procedure but with the patient's smoking history and increased pain, there is worry there could be an early re-stenosis.  He should see me or Schnier, this week

## 2019-06-02 ENCOUNTER — Other Ambulatory Visit: Payer: Self-pay

## 2019-06-02 ENCOUNTER — Ambulatory Visit (INDEPENDENT_AMBULATORY_CARE_PROVIDER_SITE_OTHER): Payer: BC Managed Care – PPO

## 2019-06-02 ENCOUNTER — Encounter (INDEPENDENT_AMBULATORY_CARE_PROVIDER_SITE_OTHER): Payer: Self-pay | Admitting: Nurse Practitioner

## 2019-06-02 ENCOUNTER — Ambulatory Visit (INDEPENDENT_AMBULATORY_CARE_PROVIDER_SITE_OTHER): Payer: BC Managed Care – PPO | Admitting: Nurse Practitioner

## 2019-06-02 VITALS — BP 136/81 | HR 73 | Resp 10 | Ht 68.0 in | Wt 167.0 lb

## 2019-06-02 DIAGNOSIS — I70221 Atherosclerosis of native arteries of extremities with rest pain, right leg: Secondary | ICD-10-CM | POA: Diagnosis not present

## 2019-06-02 DIAGNOSIS — E1142 Type 2 diabetes mellitus with diabetic polyneuropathy: Secondary | ICD-10-CM

## 2019-06-02 DIAGNOSIS — M79671 Pain in right foot: Secondary | ICD-10-CM | POA: Diagnosis not present

## 2019-06-02 DIAGNOSIS — Z720 Tobacco use: Secondary | ICD-10-CM

## 2019-06-02 DIAGNOSIS — E782 Mixed hyperlipidemia: Secondary | ICD-10-CM | POA: Diagnosis not present

## 2019-06-02 NOTE — Progress Notes (Signed)
SUBJECTIVE:  Patient ID: Justin Adkins, male    DOB: [birth year], 53 y.o.   MRN: [id number] Chief Complaint  Patient presents with  . Follow-up    HPI  [name] is a 53 y.o. male the presents today about a week after his right lower extremity angiogram complaining of continued foot pain.  The patient states that his foot pain is a stinging burning pain.  He states that it feels as if he is walking on a hard sharp gravel-like surface constantly.  He states that he had this pain prior to his angiogram and continues to have it since then.  The patient also endorses having pain in his right lower extremity after the angiogram.  He states that he was having extensive cramping pain within his calf especially with continued ambulation however that has resolved.  The patient also currently endorses having issues with controlling his blood glucose.  He has an upcoming appointment with an endocrinologist however his most recent A1c was 11.  He denies any fever, chills, nausea, vomiting or diarrhea.  The patient's noninvasive studies show a right ABI of 1.09 and a left of 1.27.  The patient has triphasic waveforms within the bilateral tibial arteries with strong toe waveforms bilaterally.  The patient's right TBI 0.93.  The patient also underwent a right lower extremity arterial duplex which reveals triphasic waveforms throughout the level of the right lower extremity.  Past Medical History:  Diagnosis Date  . Diabetes mellitus without complication [facility])     Past Surgical History:  Procedure Laterality Date  . LOWER EXTREMITY ANGIOGRAPHY Right 05/25/2019   Procedure: LOWER EXTREMITY ANGIOGRAPHY;  Surgeon: Renford Dills, MD;  Location: [facility] INVASIVE CV LAB;  Service: Cardiovascular;  Laterality: Right;  . TONSILLECTOMY      Social History   Socioeconomic History  . Marital status: Married    Spouse name: Not on file  . Number of children: Not on file  . Years of education: Not on file   . Highest education level: Not on file  Occupational History  . Not on file  Social Needs  . Financial resource strain: Not on file  . Food insecurity    Worry: Not on file    Inability: Not on file  . Transportation needs    Medical: Not on file    Non-medical: Not on file  Tobacco Use  . Smoking status: Heavy Tobacco Smoker    Packs/day: 1.00    Years: 35.00    Pack years: 35.00  . Smokeless tobacco: Never Used  Substance and Sexual Activity  . Alcohol use: Yes  . Drug use: No  . Sexual activity: Yes    Birth control/protection: None  Lifestyle  . Physical activity    Days per week: Not on file    Minutes per session: Not on file  . Stress: Not on file  Relationships  . Social Musician on phone: Not on file    Gets together: Not on file    Attends religious service: Not on file    Active member of club or organization: Not on file    Attends meetings of clubs or organizations: Not on file    Relationship status: Not on file  . Intimate partner violence    Fear of current or ex partner: Not on file    Emotionally abused: Not on file    Physically abused: Not on file    Forced sexual activity: Not  on file  Other Topics Concern  . Not on file  Social History Narrative  . Not on file    Family History  Problem Relation Age of Onset  . Cancer Father   . Heart attack Brother     Allergies  Allergen Reactions  . Naproxen Anaphylaxis  . Liraglutide Nausea Only     Review of Systems   Review of Systems: Negative Unless Checked Constitutional: Weight loss  Fever  Chills Cardiac: Chest pain    Atrial Fibrillation  Palpitations   Shortness of breath when laying flat   Shortness of breath with exertion. Shortness of breath at rest Vascular:  Pain in legs with walking   Pain in legs with standing Pain in legs when laying flat   Claudication    Pain in feet when laying flat    History of DVT   Phlebitis   Swelling in  legs   Varicose veins   Non-healing ulcers Pulmonary:   Uses home oxygen   Productive cough   Hemoptysis   Wheeze  COPD   Asthma Neurologic:  Dizziness   Seizures  Blackouts History of stroke   History of TIA  Aphasia   Temporary Blindness   Weakness or numbness in arm   Weakness or numbness in leg Musculoskeletal:   Joint swelling   Joint pain   Low back pain   History of Knee Replacement Arthritis back Surgeries   Spinal Stenosis    Hematologic:  Easy bruising  Easy bleeding   Hypercoagulable state   Anemic Gastrointestinal:  Diarrhea   Vomiting  Gastroesophageal reflux/heartburn   Difficulty swallowing. Abdominal pain Genitourinary:  Chronic kidney disease   Difficult urination  Anuric   Blood in urine Frequent urination  Burning with urination   Hematuria Skin:  Rashes   Ulcers Wounds Psychological:  History of anxiety    History of major depression   Memory Difficulties      OBJECTIVE:   Physical Exam  BP 136/81 (BP Location: Left Arm, Patient Position: Sitting, Cuff Size: Normal)   Pulse 73   Resp 10   Ht  (1.727 m)   Wt 167 lb (75.8 kg)   BMI 25.39 kg/m   Gen: WD/WN, NAD Head: [place]/AT, No temporalis wasting.  Ear/Nose/Throat: Hearing grossly intact, nares w/o erythema or drainage Eyes: PER, EOMI, sclera nonicteric.  Neck: Supple, no masses.  No JVD.  Pulmonary:  Good air movement, no use of accessory muscles.  Cardiac: RRR Vascular:  Varicosities and stasis dermatitis right lower extremity Vessel Right Left  Dorsalis Pedis Palpable Palpable  Posterior Tibial Palpable Palpable   Gastrointestinal: soft, non-distended. No guarding/no peritoneal signs.  Musculoskeletal: M/S 5/5 throughout.  No deformity or atrophy.  Neurologic: Pain and light touch intact in extremities.  Symmetrical.  Speech is fluent. Motor exam as listed above. Psychiatric: Judgment intact, Mood & affect  appropriate for pt's clinical situation. Dermatologic: No Venous rashes. No Ulcers Noted.  No changes consistent with cellulitis. Lymph : No Cervical lymphadenopathy, no lichenification or skin changes of chronic lymphedema.       ASSESSMENT AND PLAN:  1. Atherosclerosis of native artery of right lower extremity with rest pain (HCC) The patient's noninvasive studies show a right ABI of 1.09 and a left of 1.27.  The patient has triphasic waveforms within the bilateral tibial arteries with strong toe waveforms bilaterally.  The patient's right TBI 0.93.  The patient also underwent a right lower extremity arterial duplex which reveals triphasic waveforms  throughout the level of the right lower extremity.    Recommend:  The patient has evidence of atherosclerosis of the lower extremities with claudication.  The patient does not voice lifestyle limiting changes at this point in time.  Noninvasive studies do not suggest clinically significant change.  No invasive studies, angiography or surgery at this time The patient should continue walking and begin a more formal exercise program.  The patient should continue antiplatelet therapy and aggressive treatment of the lipid abnormalities  No changes in the patient's medications at this time  The patient should continue wearing graduated compression socks 10-15 mmHg strength to control the mild edema.   - VAS [place] ABI WITH/WO TBI; Future - VAS [place] LOWER EXTREMITY ARTERIAL DUPLEX; Future  2. Type 2 diabetes mellitus with peripheral neuropathy (HCC) Based upon the patient's description of pain as well as uncontrolled diabetes, it is highly suspicious that the patient's pain is caused by neuropathy.  The patient also had a previous diagnosis of neuropathy.  Currently his peripheral arterial disease after his intervention show excellent blood flow therefore the pain is not related to ischemia.  I have suggested the patient that he should contact his  primary care provider and discuss possible further tests for neuropathy or about beginning medication therapy.  3. Tobacco abuse Smoking cessation was discussed, 3-10 minutes spent on this topic specifically   4. Mixed hyperlipidemia Continue statin as ordered and reviewed, no changes at this time    Current Outpatient Medications on File Prior to Visit  Medication Sig Dispense Refill  . apixaban (ELIQUIS) 2.5 MG TABS tablet Take 1 tablet (2.5 mg total) by mouth 2 (two) times daily. 60 tablet 4  . atorvastatin (LIPITOR) 10 MG tablet Take 1 tablet (10 mg total) by mouth daily. Additional refills per patient's primary care provider 30 tablet 1  . clopidogrel (PLAVIX) 75 MG tablet Take 1 tablet (75 mg total) by mouth daily. 30 tablet 11  . dapagliflozin propanediol (FARXIGA) 5 MG TABS tablet Take 5 mg by mouth daily.     . metFORMIN (GLUCOPHAGE) 1000 MG tablet Take 1,000 mg by mouth 2 (two) times daily with a meal.    . Insulin Pen Needle (FIFTY50 PEN NEEDLES) 31G X 5 MM MISC Use as directed    . NOVOLIN N 100 UNIT/ML injection      No current facility-administered medications on file prior to visit.     There are no Patient Instructions on file for this visit. No follow-ups on file.   Kris Hartmann, NP  This note was completed with Sales executive.  Any errors are purely unintentional.

## 2019-06-13 ENCOUNTER — Other Ambulatory Visit: Payer: BC Managed Care – PPO

## 2019-06-20 ENCOUNTER — Encounter (INDEPENDENT_AMBULATORY_CARE_PROVIDER_SITE_OTHER): Payer: BC Managed Care – PPO

## 2019-06-20 ENCOUNTER — Ambulatory Visit (INDEPENDENT_AMBULATORY_CARE_PROVIDER_SITE_OTHER): Payer: BC Managed Care – PPO | Admitting: Vascular Surgery

## 2019-08-06 ENCOUNTER — Other Ambulatory Visit (INDEPENDENT_AMBULATORY_CARE_PROVIDER_SITE_OTHER): Payer: Self-pay | Admitting: Vascular Surgery

## 2019-08-08 NOTE — Telephone Encounter (Signed)
Please advise refill? 

## 2019-08-08 NOTE — Telephone Encounter (Signed)
Patient on 40 mg Lipitor with PCP. Decline

## 2019-09-08 ENCOUNTER — Encounter (INDEPENDENT_AMBULATORY_CARE_PROVIDER_SITE_OTHER): Payer: BC Managed Care – PPO

## 2019-09-08 ENCOUNTER — Ambulatory Visit (INDEPENDENT_AMBULATORY_CARE_PROVIDER_SITE_OTHER): Payer: BC Managed Care – PPO | Admitting: Vascular Surgery

## 2019-09-26 ENCOUNTER — Ambulatory Visit (INDEPENDENT_AMBULATORY_CARE_PROVIDER_SITE_OTHER): Payer: BC Managed Care – PPO

## 2019-09-26 ENCOUNTER — Other Ambulatory Visit: Payer: Self-pay

## 2019-09-26 ENCOUNTER — Ambulatory Visit (INDEPENDENT_AMBULATORY_CARE_PROVIDER_SITE_OTHER): Payer: BC Managed Care – PPO | Admitting: Vascular Surgery

## 2019-09-26 ENCOUNTER — Encounter (INDEPENDENT_AMBULATORY_CARE_PROVIDER_SITE_OTHER): Payer: Self-pay | Admitting: Vascular Surgery

## 2019-09-26 VITALS — BP 128/85 | HR 69 | Resp 12 | Ht 68.0 in | Wt 184.0 lb

## 2019-09-26 DIAGNOSIS — I70221 Atherosclerosis of native arteries of extremities with rest pain, right leg: Secondary | ICD-10-CM

## 2019-09-26 DIAGNOSIS — E1169 Type 2 diabetes mellitus with other specified complication: Secondary | ICD-10-CM | POA: Diagnosis not present

## 2019-09-26 DIAGNOSIS — E1142 Type 2 diabetes mellitus with diabetic polyneuropathy: Secondary | ICD-10-CM

## 2019-09-26 DIAGNOSIS — E785 Hyperlipidemia, unspecified: Secondary | ICD-10-CM

## 2019-09-26 NOTE — Progress Notes (Signed)
MRN : [id number]  Justin Adkins is a 54 y.o. (May 31, 1966) male who presents with chief complaint of  Chief Complaint  Patient presents with  . Follow-up    U/S Follow up  .  History of Present Illness:   The patient returns to the office for followup and review status post angiogram with intervention 05/25/2019.   Procedure performed:             1. Percutaneous transluminal angioplasty right  popliteal arteries to 5 mm 2. Percutaneous transluminal angioplasty right anterior tibial artery to 3 mm  3. Infusion of 6 milligrams of TPA right popliteal artery             4.  Mechanical thrombectomy with the penumbra CAT 6 device right popliteal artery.             5.  Mechanical thrombectomy of the right tibioperoneal trunk and proximal peroneal with the penumbra CAT 6 device.  The patient notes improvement in the lower extremity symptoms. No interval shortening of the patient's claudication distance or rest pain symptoms. Previous wounds have now healed.  No new ulcers or wounds have occurred since the last visit.  There have been no significant changes to the patient's overall health care.  The patient denies amaurosis fugax or recent TIA symptoms. There are no recent neurological changes noted. The patient denies history of DVT, PE or superficial thrombophlebitis. The patient denies recent episodes of angina or shortness of breath.   ABI's Rt=0.93 and Lt=1.24; The patient has triphasic waveforms  (previous ABI's Rt=1.09 and Lt=1.27; The patient has triphasic waveforms)  Duplex US of the right lower extremity arterial system shows widely patent arterial system       Current Meds  Medication Sig  . apixaban (ELIQUIS) 2.5 MG TABS tablet Take 1 tablet (2.5 mg total) by mouth 2 (two) times daily.  Marland Kitchen atorvastatin (LIPITOR) 10 MG tablet Take 1 tablet (10 mg total) by mouth daily. Additional refills per patient's primary care provider  . Blood Glucose  Monitoring Suppl (ONETOUCH VERIO REFLECT) w/Device KIT USE TO TEST BLOOD GLUCOSE  . Blood Glucose Monitoring Suppl (ONETOUCH VERIO) w/Device KIT Use to test blood glucose  . clopidogrel (PLAVIX) 75 MG tablet Take 1 tablet (75 mg total) by mouth daily.  . dapagliflozin propanediol (FARXIGA) 5 MG TABS tablet Take 5 mg by mouth daily.   . ergocalciferol (VITAMIN D2) 1.25 MG (50000 UT) capsule Take by mouth.  . Insulin Pen Needle (FIFTY50 PEN NEEDLES) 31G X 5 MM MISC Use as directed  . NOVOLIN N 100 UNIT/ML injection   . pregabalin (LYRICA) 75 MG capsule Take by mouth.  . Semaglutide,0.25 or 0.5MG/DOS, 2 MG/1.5ML SOPN Inject into the skin.    Past Medical History:  Diagnosis Date  . Diabetes mellitus without complication [facility])     Past Surgical History:  Procedure Laterality Date  . LOWER EXTREMITY ANGIOGRAPHY Right 05/25/2019   Procedure: LOWER EXTREMITY ANGIOGRAPHY;  Surgeon: Katha Cabal, MD;  Location: [place] CV LAB;  Service: Cardiovascular;  Laterality: Right;  . TONSILLECTOMY      Social History Social History   Tobacco Use  . Smoking status: Current Every Day Smoker    Packs/day: 1.00    Years: 35.00    Pack years: 35.00  . Smokeless tobacco: Never Used  Substance Use Topics  . Alcohol use: Yes  . Drug use: No    Family History Family History  Problem Relation Age of  Onset  . Cancer Father   . Heart attack Brother     Allergies  Allergen Reactions  . Naproxen Anaphylaxis  . Liraglutide Nausea Only     REVIEW OF SYSTEMS (Negative unless checked)  Constitutional: []Weight loss  []Fever  []Chills Cardiac: []Chest pain   []Chest pressure   []Palpitations   []Shortness of breath when laying flat   []Shortness of breath with exertion. Vascular:  []Pain in legs with walking   []Pain in legs at rest  []History of DVT   []Phlebitis   []Swelling in legs   []Varicose veins   []Non-healing ulcers Pulmonary:   []Uses home oxygen   []Productive cough    []Hemoptysis   []Wheeze  []COPD   []Asthma Neurologic:  []Dizziness   []Seizures   []History of stroke   []History of TIA  []Aphasia   []Vissual changes   []Weakness or numbness in arm   []Weakness or numbness in leg Musculoskeletal:   []Joint swelling   [x]Joint pain   []Low back pain Hematologic:  []Easy bruising  []Easy bleeding   []Hypercoagulable state   []Anemic Gastrointestinal:  []Diarrhea   []Vomiting  []Gastroesophageal reflux/heartburn   []Difficulty swallowing. Genitourinary:  []Chronic kidney disease   []Difficult urination  []Frequent urination   []Blood in urine Skin:  []Rashes   []Ulcers  Psychological:  []History of anxiety   [] History of major depression.  Physical Examination  Vitals:   09/26/19 0846  BP: 128/85  Pulse: 69  Resp: 12  Weight: 184 lb (83.5 kg)  Height: 5' 8" (1.727 m)   Body mass index is 27.98 kg/m. Gen: WD/WN, NAD Head: [place]/AT, No temporalis wasting.  Ear/Nose/Throat: Hearing grossly intact, nares w/o erythema or drainage Eyes: PER, EOMI, sclera nonicteric.  Neck: Supple, no large masses.   Pulmonary:  Good air movement, no audible wheezing bilaterally, no use of accessory muscles.  Cardiac: RRR, no JVD Vascular:  Right foot in a splint Vessel Right Left  Radial Palpable Palpable  Gastrointestinal: Non-distended. No guarding/no peritoneal signs.  Musculoskeletal: M/S 5/5 throughout.  No deformity or atrophy.  Neurologic: CN 2-12 intact. Symmetrical.  Speech is fluent. Motor exam as listed above. Psychiatric: Judgment intact, Mood & affect appropriate for pt's clinical situation. Dermatologic: No rashes or ulcers noted.  No changes consistent with cellulitis. Lymph : No lichenification or skin changes of chronic lymphedema.  CBC No results found for: WBC, HGB, HCT, MCV, PLT  BMET    Component Value Date/Time   NA 136 05/25/2019 0923   K 4.5 05/25/2019 0923   CL 102 05/25/2019 0923   CO2 23 05/25/2019 0923   GLUCOSE 378 (H) 05/25/2019  0923   BUN 18 05/25/2019 0923   CREATININE 0.65 05/25/2019 0923   CALCIUM 9.5 05/25/2019 0923   GFRNONAA >60 05/25/2019 0923   GFRAA >60 05/25/2019 0923   CrCl cannot be calculated (Patient's most recent lab result is older than the maximum 21 days allowed.).  COAG No results found for: INR, PROTIME  Radiology No results found.   Assessment/Plan 1. Atherosclerosis of native artery of right lower extremity with rest pain [facility]) Recommend:  The patient is status post successful angiogram with intervention.  His rest pain symptoms have now resolved and he is walking almost pain-free.  He does have a Morton's neuroma of the right foot which is causing him some difficulty.  We have discussed that he can be off his antiplatelet agents after mid April (6 months status post intervention) and that  he would restart his antiplatelet agents 48 hours after surgery if that is indeed necessary.  Also very important the patient has quit smoking since his angio.  The patient reports that the claudication symptoms and leg pain is essentially gone.   The patient denies lifestyle limiting changes at this point in time.  No further invasive studies, angiography or surgery at this time The patient should continue walking and begin a more formal exercise program.  The patient should continue antiplatelet therapy and aggressive treatment of the lipid abnormalities  Smoking cessation was again discussed  Patient should undergo noninvasive studies as ordered. The patient will follow up with me after the studies.    2. Type 2 diabetes mellitus with peripheral neuropathy ([place]) Continue hypoglycemic medications as already ordered, these medications have been reviewed and there are no changes at this time.  Hgb A1C to be monitored as already arranged by primary service   3. Hyperlipidemia associated with type 2 diabetes mellitus ([place]) Continue statin as ordered and reviewed, no changes at this  time    Hortencia Pilar, MD  09/26/2019 9:00 AM

## 2019-11-01 ENCOUNTER — Emergency Department: Payer: BC Managed Care – PPO

## 2019-11-01 ENCOUNTER — Emergency Department
Admission: EM | Admit: 2019-11-01 | Discharge: 2019-11-01 | Disposition: A | Discharge status: Home / Self Care | Payer: BC Managed Care – PPO | Attending: Emergency Medicine | Admitting: Emergency Medicine

## 2019-11-01 ENCOUNTER — Other Ambulatory Visit: Payer: Self-pay

## 2019-11-01 ENCOUNTER — Encounter: Payer: Self-pay | Admitting: Emergency Medicine

## 2019-11-01 DIAGNOSIS — K5900 Constipation, unspecified: Secondary | ICD-10-CM | POA: Diagnosis present

## 2019-11-01 DIAGNOSIS — Z87891 Personal history of nicotine dependence: Secondary | ICD-10-CM | POA: Insufficient documentation

## 2019-11-01 DIAGNOSIS — E1151 Type 2 diabetes mellitus with diabetic peripheral angiopathy without gangrene: Secondary | ICD-10-CM | POA: Diagnosis not present

## 2019-11-01 DIAGNOSIS — Z794 Long term (current) use of insulin: Secondary | ICD-10-CM | POA: Diagnosis not present

## 2019-11-01 DIAGNOSIS — K5903 Drug induced constipation: Secondary | ICD-10-CM

## 2019-11-01 DIAGNOSIS — E119 Type 2 diabetes mellitus without complications: Secondary | ICD-10-CM | POA: Diagnosis not present

## 2019-11-01 DIAGNOSIS — Z7901 Long term (current) use of anticoagulants: Secondary | ICD-10-CM | POA: Insufficient documentation

## 2019-11-01 MED ORDER — DOCUSATE SODIUM 100 MG PO CAPS: 100.0000 mg | ORAL_CAPSULE | Freq: Two times a day (BID) | ORAL | 2 refills | Status: DC

## 2019-11-01 MED ORDER — SENNA 8.6 MG PO TABS: 1.0000 | ORAL_TABLET | Freq: Every day | ORAL | 0 refills | Status: DC

## 2019-11-01 MED ORDER — DOCUSATE SODIUM 50 MG/5ML PO LIQD
50.0000 mg | Freq: Once | ORAL | Status: AC
  Administered 2019-11-01: 50 mg
  Filled 2019-11-01 (×2): qty 10

## 2019-11-01 NOTE — ED Notes (Signed)
md in with pt for disimpaction

## 2019-11-01 NOTE — ED Notes (Signed)
Report off to gracie  rn  

## 2019-11-01 NOTE — ED Triage Notes (Addendum)
Pt presents to ED with constipation for the past 3 days with rectal pressure. Has taken stool softeners and miralax with no relief. Pt states his last bowel movement was hard and small amount. Current symptoms have  occurred 2 other times this past month. Denies abd pain.

## 2019-11-01 NOTE — ED Provider Notes (Signed)
[facility] Emergency Department Provider Note  ____________________________________________  Time seen: Approximately 3:50 AM  I have reviewed the triage vital signs and the nursing notes.   HISTORY  Chief Complaint Constipation   HPI Justin Adkins is a 54 y.o. male with a history of diabetes, PVD on Plavix, hyperlipidemia, smoking who presents for evaluation of constipation.   Patient reports that he was started on Ozempic for his diabetes about a month and a half ago.  Since then he has had 3 episodes of constipation.  This one started 3 days ago.  Has not had any bowel movement since then.  No nausea or vomiting.  No abdominal pain.  Patient is feeling a lot of rectal pressure which has become more severe this evening.  Patient has no family history of colon cancer.  Had a colonoscopy 6 months ago which was unremarkable.  No abdominal surgeries.  He is passing flatus.  No abdominal distention.  Past Medical History:  Diagnosis Date  . Diabetes mellitus without complication [facility])     Patient Active Problem List   Diagnosis Date Noted  . PVD (peripheral vascular disease) ([place]) 05/31/2019  . Popliteal artery occlusion, right ([place]) 05/20/2019  . Atherosclerosis of native arteries of extremity with rest pain ([place]) 05/19/2019  . Hyperlipidemia 05/19/2019  . Hyperlipidemia associated with type 2 diabetes mellitus ([place]) 05/19/2019  . Type 2 diabetes mellitus with peripheral neuropathy ([place]) 01/05/2018  . Other male erectile dysfunction 01/05/2018  . Total or mature cataract 03/05/2017  . Tobacco abuse 05/03/2013    Past Surgical History:  Procedure Laterality Date  . HERNIA REPAIR    . LOWER EXTREMITY ANGIOGRAPHY Right 05/25/2019   Procedure: LOWER EXTREMITY ANGIOGRAPHY;  Surgeon: Katha Cabal, MD;  Location: [place] [facility];  Service: Cardiovascular;  Laterality: Right;  . TONSILLECTOMY      Prior to Admission medications   Medication Sig  Start Date End Date Taking? Authorizing Provider  apixaban (ELIQUIS) 2.5 MG TABS tablet Take 1 tablet (2.5 mg total) by mouth 2 (two) times daily. 05/25/19   Schnier, Dolores Lory, MD  atorvastatin (LIPITOR) 10 MG tablet Take 1 tablet (10 mg total) by mouth daily. Additional refills per patient's primary care provider 05/25/19 05/24/20  Schnier, Dolores Lory, MD  Blood Glucose Monitoring Suppl (ONETOUCH VERIO REFLECT) w/Device KIT USE TO TEST BLOOD GLUCOSE 06/30/19   [provider]  Blood Glucose Monitoring Suppl (ONETOUCH VERIO) w/Device KIT Use to test blood glucose 06/30/19   [provider]  clopidogrel (PLAVIX) 75 MG tablet Take 1 tablet (75 mg total) by mouth daily. 05/26/19   Schnier, Dolores Lory, MD  dapagliflozin propanediol (FARXIGA) 5 MG TABS tablet Take 5 mg by mouth daily.  05/12/19 05/11/20  [provider]  docusate sodium (COLACE) 100 MG capsule Take 1 capsule (100 mg total) by mouth 2 (two) times daily. 11/01/19 10/31/20  Rudene Re, MD  ergocalciferol (VITAMIN D2) 1.25 MG (50000 UT) capsule Take by mouth. 09/08/19   [provider]  Insulin Pen Needle (FIFTY50 PEN NEEDLES) 31G X 5 MM MISC Use as directed 01/07/19 01/07/20  [provider]  metFORMIN (GLUCOPHAGE) 1000 MG tablet Take 1,000 mg by mouth 2 (two) times daily with a meal. 01/07/19   [provider]  NOVOLIN N 100 UNIT/ML injection  05/27/19   [provider]  pregabalin (LYRICA) 75 MG capsule Take by mouth. 07/11/19 07/10/20  [provider]  senna (SENOKOT) 8.6 MG TABS tablet  Take 1 tablet (8.6 mg total) by mouth at bedtime. 11/01/19   Rudene Re, MD    Allergies Naproxen and Liraglutide  Family History  Problem Relation Age of Onset  . Cancer Father   . Heart attack Brother     Social History Social History   Tobacco Use  . Smoking status: Former Smoker    Packs/day: 1.00    Years: 35.00    Pack years: 35.00  . Smokeless tobacco: Never  Used  Substance Use Topics  . Alcohol use: Yes  . Drug use: No    Review of Systems  Constitutional: Negative for fever. Eyes: Negative for visual changes. ENT: Negative for sore throat. Neck: No neck pain  Cardiovascular: Negative for chest pain. Respiratory: Negative for shortness of breath. Gastrointestinal: Negative for abdominal pain, vomiting or diarrhea. + constipation Genitourinary: Negative for dysuria. Musculoskeletal: Negative for back pain. Skin: Negative for rash. Neurological: Negative for headaches, weakness or numbness. Psych: No SI or HI  ____________________________________________   PHYSICAL EXAM:  VITAL SIGNS: ED Triage Vitals  Enc Vitals Group     BP 11/01/19 0113 (!) 138/105     Pulse Rate 11/01/19 0113 (!) 108     Resp 11/01/19 0113 18     Temp 11/01/19 0113 98.5 F (36.9 C)     Temp Source 11/01/19 0113 Oral     SpO2 11/01/19 0113 100 %     Weight 11/01/19 0114 173 lb (78.5 kg)     Height 11/01/19 0114 '5\' 8"'  (1.727 m)     Head Circumference --      Peak Flow --      Pain Score 11/01/19 0113 0     Pain Loc --      Pain Edu? --      Excl. in [place]? --     Constitutional: Alert and oriented. Well appearing and in no apparent distress. HEENT:      Head: Normocephalic and atraumatic.         Eyes: Conjunctivae are normal. Sclera is non-icteric.       Mouth/Throat: Mucous membranes are moist.       Neck: Supple with no signs of meningismus. Cardiovascular: Regular rate and rhythm.  Respiratory: Normal respiratory effort.  Gastrointestinal: Soft, non tender, and non distended with positive bowel sounds. No rebound or guarding. Genitourinary: Rectal exam shows stool impaction and a small external hemorrhoid Musculoskeletal: No edema, cyanosis, or erythema of extremities. Neurologic: Normal speech and language. Face is symmetric. Moving all extremities. No gross focal neurologic deficits are appreciated. Skin: Skin is warm, dry and intact. No rash  noted. Psychiatric: Mood and affect are normal. Speech and behavior are normal.  ____________________________________________   LABS (all labs ordered are listed, but only abnormal results are displayed)  Labs Reviewed - No data to display ____________________________________________  EKG  none  ____________________________________________  RADIOLOGY  I have personally reviewed the images performed during this visit and I agree with the Radiologist's read.   Interpretation by Radiologist:  DG Abdomen 1 View  Result Date: 11/01/2019 CLINICAL DATA:  Constipation EXAM: ABDOMEN - 1 VIEW COMPARISON:  None. FINDINGS: The bowel gas pattern is normal. No radio-opaque calculi or other significant radiographic abnormality are seen. Moderate colonic stool volume. IMPRESSION: Moderate colonic stool volume. Electronically Signed   By: Ulyses Jarred M.D.   On: 11/01/2019 02:41     ____________________________________________   PROCEDURES  Procedure(s) performed:yes Procedures   ------------------------------------------------------------------------------------------------------------------- Fecal Disimpaction Procedure Note:  Performed by  me:  Patient placed in the lateral recumbent position with knees drawn towards chest. Nurse present for patient support. Large amount of hard brown stool removed. No complications during procedure.   ------------------------------------------------------------------------------------------------------------------   Critical Care performed:  None ____________________________________________   INITIAL IMPRESSION / ASSESSMENT AND PLAN / ED COURSE   54 y.o. male with a history of diabetes, PVD on Plavix, hyperlipidemia, smoking who presents for evaluation of constipation since being started on Ozempic for his diabetes.  No abdominal pain, no distention, is passing flatus, no abdominal tenderness, positive bowel sounds.  Patient is well-appearing  and in no distress.  Rectal exam showing stool impaction which was disimpacted per procedure note above.  X-ray showing moderate stool burden.  Review of patient's medical record shows recent colonoscopy 6 months ago which was within normal limits.  Clinically no signs of SBO.  Patient was given a soapsuds enema with good bowel movement and full resolution of rectal pressure.  Will start patient on daily senna and Colace to prevent recurrence of his constipation while on this medication.  Recommended that he discuss the side effects with his endocrinologist.  Discussed return precautions for any signs of SBO or any abdominal pain.  Otherwise patient is to follow-up with his primary care doctor.      _____________________________________________ Please note:  Patient was evaluated in Emergency Department today for the symptoms described in the history of present illness. Patient was evaluated in the context of the global COVID-19 pandemic, which necessitated consideration that the patient might be at risk for infection with the SARS-CoV-2 virus that causes COVID-19. Institutional protocols and algorithms that pertain to the evaluation of patients at risk for COVID-19 are in a state of rapid change based on information released by regulatory bodies including the CDC and federal and state organizations. These policies and algorithms were followed during the patient's care in the ED.  Some ED evaluations and interventions may be delayed as a result of limited staffing during the pandemic.   ____________________________________________   FINAL CLINICAL IMPRESSION(S) / ED DIAGNOSES   Final diagnoses:  Drug-induced constipation      NEW MEDICATIONS STARTED DURING THIS VISIT:  ED Discharge Orders         Ordered    senna (SENOKOT) 8.6 MG TABS tablet  Daily at bedtime     11/01/19 0313    docusate sodium (COLACE) 100 MG capsule  2 times daily     11/01/19 3154           Note:  This document  was prepared using Dragon voice recognition software and may include unintentional dictation errors.    Alfred Levins, [place], MD 11/01/19 [phone]

## 2019-11-01 NOTE — Discharge Instructions (Signed)
Constipation: Take colace twice a day everyday. Take senna once a day at bedtime. Take daily probiotics. Drink plenty of fluids and eat a diet rich in fiber. For the next 3 days take 3600mg  or 37ml of 400mg /40ml Milk of Magnesia at bedtime.  Return to the emergency room if you have abdominal pain, nausea or vomiting.  Otherwise follow-up with your primary care doctor in 2 to 3 days.

## 2019-11-01 NOTE — ED Notes (Signed)
Pt has constipation for 3 days.  Pt using otc meds without relief.  No abd pain. No vomiting.  Pt alert.

## 2019-12-28 ENCOUNTER — Other Ambulatory Visit (INDEPENDENT_AMBULATORY_CARE_PROVIDER_SITE_OTHER): Payer: Self-pay | Admitting: Vascular Surgery

## 2020-02-24 ENCOUNTER — Other Ambulatory Visit: Payer: BC Managed Care – PPO

## 2020-02-27 ENCOUNTER — Other Ambulatory Visit: Payer: Self-pay

## 2020-02-27 ENCOUNTER — Other Ambulatory Visit
Admission: RE | Admit: 2020-02-27 | Discharge: 2020-02-27 | Disposition: A | Discharge status: Home / Self Care | Payer: BC Managed Care – PPO | Source: Ambulatory Visit | Attending: Gastroenterology | Admitting: Gastroenterology

## 2020-02-27 DIAGNOSIS — Z20822 Contact with and (suspected) exposure to covid-19: Secondary | ICD-10-CM | POA: Diagnosis present

## 2020-02-27 LAB — SARS CORONAVIRUS 2 (TAT 6-24 HRS): SARS Coronavirus 2: NEGATIVE

## 2020-02-28 ENCOUNTER — Ambulatory Visit
Admission: RE | Admit: 2020-02-28 | Discharge: 2020-02-28 | Disposition: A | Discharge status: Home / Self Care | Payer: BC Managed Care – PPO | Attending: Gastroenterology | Admitting: Gastroenterology

## 2020-02-28 ENCOUNTER — Encounter: Payer: Self-pay | Admitting: *Deleted

## 2020-02-28 ENCOUNTER — Ambulatory Visit: Payer: BC Managed Care – PPO | Admitting: Anesthesiology

## 2020-02-28 ENCOUNTER — Encounter
Admission: RE | Disposition: A | Discharge status: Home / Self Care | Payer: Self-pay | Source: Home / Self Care | Attending: Gastroenterology

## 2020-02-28 DIAGNOSIS — K3189 Other diseases of stomach and duodenum: Secondary | ICD-10-CM | POA: Diagnosis not present

## 2020-02-28 DIAGNOSIS — Z79899 Other long term (current) drug therapy: Secondary | ICD-10-CM | POA: Insufficient documentation

## 2020-02-28 DIAGNOSIS — E1151 Type 2 diabetes mellitus with diabetic peripheral angiopathy without gangrene: Secondary | ICD-10-CM | POA: Diagnosis not present

## 2020-02-28 DIAGNOSIS — Z794 Long term (current) use of insulin: Secondary | ICD-10-CM | POA: Insufficient documentation

## 2020-02-28 DIAGNOSIS — R12 Heartburn: Secondary | ICD-10-CM | POA: Diagnosis present

## 2020-02-28 DIAGNOSIS — Z886 Allergy status to analgesic agent status: Secondary | ICD-10-CM | POA: Diagnosis not present

## 2020-02-28 DIAGNOSIS — K449 Diaphragmatic hernia without obstruction or gangrene: Secondary | ICD-10-CM | POA: Insufficient documentation

## 2020-02-28 DIAGNOSIS — Z7902 Long term (current) use of antithrombotics/antiplatelets: Secondary | ICD-10-CM | POA: Diagnosis not present

## 2020-02-28 DIAGNOSIS — K2970 Gastritis, unspecified, without bleeding: Secondary | ICD-10-CM | POA: Insufficient documentation

## 2020-02-28 DIAGNOSIS — K2100 Gastro-esophageal reflux disease with esophagitis, without bleeding: Secondary | ICD-10-CM | POA: Insufficient documentation

## 2020-02-28 DIAGNOSIS — Z7901 Long term (current) use of anticoagulants: Secondary | ICD-10-CM | POA: Diagnosis not present

## 2020-02-28 DIAGNOSIS — Z888 Allergy status to other drugs, medicaments and biological substances status: Secondary | ICD-10-CM | POA: Diagnosis not present

## 2020-02-28 HISTORY — PX: ESOPHAGOGASTRODUODENOSCOPY (EGD) WITH PROPOFOL: SHX5813

## 2020-02-28 HISTORY — DX: Peripheral vascular disease, unspecified: I73.9

## 2020-02-28 LAB — GLUCOSE, CAPILLARY: Glucose-Capillary: 97 mg/dL (ref 70–99)

## 2020-02-28 SURGERY — ESOPHAGOGASTRODUODENOSCOPY (EGD) WITH PROPOFOL
Anesthesia: General

## 2020-02-28 MED ORDER — LIDOCAINE HCL (PF) 1 % IJ SOLN
INTRAMUSCULAR | Status: AC
  Filled 2020-02-28: qty 2

## 2020-02-28 MED ORDER — SODIUM CHLORIDE 0.9 % IV SOLN: INTRAVENOUS | Status: DC

## 2020-02-28 MED ORDER — PROPOFOL 500 MG/50ML IV EMUL
INTRAVENOUS | Status: DC | PRN
  Administered 2020-02-28: 150 ug/kg/min via INTRAVENOUS

## 2020-02-28 NOTE — Op Note (Signed)
[facility] Gastroenterology Patient Name: Justin Adkins Procedure Date: 02/28/2020 12:30 PM MRN: [id number] Account #: [id number] Date of Birth: [birth year] Admit Type: Outpatient Age: 54 Room: [facility] ENDO ROOM 3 Gender: Male Note Status: Finalized Procedure:             Upper GI endoscopy Indications:           Heartburn Providers:             Andrey Farmer MD, MD Referring MD:          [facility] (Referring MD) Medicines:             Monitored Anesthesia Care Complications:         No immediate complications. Estimated blood loss:                         Minimal. Procedure:             Pre-Anesthesia Assessment:                        - Prior to the procedure, a History and Physical was                         performed, and patient medications and allergies were                         reviewed. The patient is competent. The risks and                         benefits of the procedure and the sedation options and                         risks were discussed with the patient. All questions                         were answered and informed consent was obtained.                         Patient identification and proposed procedure were                         verified by the physician, the nurse, the anesthetist                         and the technician in the endoscopy suite. Mental                         Status Examination: alert and oriented. Airway                         Examination: normal oropharyngeal airway and neck                         mobility. Respiratory Examination: clear to                         auscultation. CV Examination: normal. Prophylactic                         Antibiotics:  The patient does not require prophylactic                         antibiotics. Prior Anticoagulants: The patient has                         taken Eliquis (apixaban), last dose was 5 days prior                         to procedure. ASA Grade  Assessment: II - A patient                         with mild systemic disease. After reviewing the risks                         and benefits, the patient was deemed in satisfactory                         condition to undergo the procedure. The anesthesia                         plan was to use monitored anesthesia care (MAC).                         Immediately prior to administration of medications,                         the patient was re-assessed for adequacy to receive                         sedatives. The heart rate, respiratory rate, oxygen                         saturations, blood pressure, adequacy of pulmonary                         ventilation, and response to care were monitored                         throughout the procedure. The physical status of the                         patient was re-assessed after the procedure.                        - Prior Anticoagulants: The patient has taken Plavix                         (clopidogrel), last dose was 5 days prior to procedure.                        After obtaining informed consent, the endoscope was                         passed under direct vision. Throughout the procedure,                         the patient's blood pressure, pulse,  and oxygen                         saturations were monitored continuously. The Endoscope                         was introduced through the mouth, and advanced to the                         second part of duodenum. The upper GI endoscopy was                         accomplished without difficulty. The patient tolerated                         the procedure well. Findings:      LA Grade A (one or more mucosal breaks less than 5 mm, not extending       between tops of 2 mucosal folds) esophagitis with no bleeding was found.      A small hiatal hernia was present.      Localized mild inflammation characterized by erythema was found in the       gastric antrum. Biopsies were taken with a cold  forceps for Helicobacter       pylori testing. Estimated blood loss was minimal.      The examined duodenum was normal.      Localized mild mucosal variance characterized by erythema was found in       the ampulla. Biopsies were taken with a cold forceps for histology.       Estimated blood loss was minimal. Impression:            - LA Grade A reflux esophagitis with no bleeding.                        - Small hiatal hernia.                        - Gastritis. Biopsied.                        - Normal examined duodenum.                        - Mucosal variant in the duodenum. Biopsied. Recommendation:        - Discharge patient to home.                        - Resume previous diet.                        - Continue present medications.                        - Await pathology results.                        - Return to nurse practitioner as previously scheduled. Procedure Code(s):     --- Professional ---                        [phone], Esophagogastroduodenoscopy, flexible,  transoral; with biopsy, single or multiple Diagnosis Code(s):     --- Professional ---                        K21.00, Gastro-esophageal reflux disease with                         esophagitis, without bleeding                        K44.9, Diaphragmatic hernia without obstruction or                         gangrene                        K29.70, Gastritis, unspecified, without bleeding                        K31.89, Other diseases of stomach and duodenum                        R12, Heartburn CPT copyright 2019 American Medical Association. All rights reserved. The codes documented in this report are preliminary and upon coder review may  be revised to meet current compliance requirements. Andrey Farmer, MD Andrey Farmer MD, MD 02/28/2020 12:49:51 PM Number of Addenda: 0 Note Initiated On: 02/28/2020 12:30 PM Estimated Blood Loss:  Estimated blood loss was minimal.      [facility]

## 2020-02-28 NOTE — Interval H&P Note (Signed)
History and Physical Interval Note:  02/28/2020 12:21 PM  Justin Adkins  has presented today for surgery, with the diagnosis of GERD.  The various methods of treatment have been discussed with the patient and family. After consideration of risks, benefits and other options for treatment, the patient has consented to  Procedure(s): ESOPHAGOGASTRODUODENOSCOPY (EGD) WITH PROPOFOL (N/A) as a surgical intervention.  The patient's history has been reviewed, patient examined, no change in status, stable for surgery.  I have reviewed the patient's chart and labs.  Questions were answered to the patient's satisfaction.     Regis Bill  Ok to proceed with EGD/

## 2020-02-28 NOTE — Transfer of Care (Signed)
Immediate Anesthesia Transfer of Care Note  Patient: Justin Adkins  Procedure(s) Performed: ESOPHAGOGASTRODUODENOSCOPY (EGD) WITH PROPOFOL (N/A )  Patient Location: PACU  Anesthesia Type:General  Level of Consciousness: awake and sedated  Airway & Oxygen Therapy: Patient Spontanous Breathing and Patient connected to nasal cannula oxygen  Post-op Assessment: Report given to RN and Post -op Vital signs reviewed and stable  Post vital signs: Reviewed and stable  Last Vitals:  Vitals Value Taken Time  BP    Temp    Pulse    Resp    SpO2      Last Pain:  Vitals:   02/28/20 1159  TempSrc: Temporal         Complications: No complications documented.

## 2020-02-28 NOTE — Anesthesia Preprocedure Evaluation (Signed)
Anesthesia Evaluation  Patient identified by MRN, date of birth, ID band Patient awake    Reviewed: Allergy & Precautions, NPO status , Patient's Chart, lab work & pertinent test results  History of Anesthesia Complications Negative for: history of anesthetic complications  Airway Mallampati: II  TM Distance: >3 FB Neck ROM: Full    Dental no notable dental hx. (+) Teeth Intact   Pulmonary neg pulmonary ROS, neg sleep apnea, neg COPD, Patient abstained from smoking.Not current smoker, former smoker,    Pulmonary exam normal breath sounds clear to auscultation       Cardiovascular Exercise Tolerance: Good METS(-) hypertension+ Peripheral Vascular Disease  (-) CAD and (-) Past MI (-) dysrhythmias  Rhythm:Regular Rate:Normal - Systolic murmurs    Neuro/Psych negative neurological ROS  negative psych ROS   GI/Hepatic neg GERD  ,(+)     (-) substance abuse  ,   Endo/Other  diabetes  Renal/GU negative Renal ROS     Musculoskeletal   Abdominal   Peds  Hematology   Anesthesia Other Findings Past Medical History: No date: Diabetes mellitus without complication (HCC) No date: Peripheral vascular disease (HCC)  Reproductive/Obstetrics                             Anesthesia Physical Anesthesia Plan  ASA: III  Anesthesia Plan: General   Post-op Pain Management:    Induction: Intravenous  PONV Risk Score and Plan: 3 and Ondansetron, Propofol infusion and TIVA  Airway Management Planned: Nasal Cannula  Additional Equipment: None  Intra-op Plan:   Post-operative Plan:   Informed Consent: I have reviewed the patients History and Physical, chart, labs and discussed the procedure including the risks, benefits and alternatives for the proposed anesthesia with the patient or authorized representative who has indicated his/her understanding and acceptance.     Dental advisory given  Plan  Discussed with: CRNA and Surgeon  Anesthesia Plan Comments: (Discussed risks of anesthesia with patient, including possibility of difficulty with spontaneous ventilation under anesthesia necessitating airway intervention, PONV, and rare risks such as cardiac or respiratory or neurological events. Patient understands.)        Anesthesia Quick Evaluation

## 2020-02-28 NOTE — Anesthesia Postprocedure Evaluation (Signed)
Anesthesia Post Note  Patient: Justin Adkins  Procedure(s) Performed: ESOPHAGOGASTRODUODENOSCOPY (EGD) WITH PROPOFOL (N/A )  Patient location during evaluation: Endoscopy Anesthesia Type: General Level of consciousness: awake and alert Pain management: pain level controlled Vital Signs Assessment: post-procedure vital signs reviewed and stable Respiratory status: spontaneous breathing, nonlabored ventilation, respiratory function stable and patient connected to nasal cannula oxygen Cardiovascular status: blood pressure returned to baseline and stable Postop Assessment: no apparent nausea or vomiting Anesthetic complications: no   No complications documented.   Last Vitals:  Vitals:   02/28/20 1306 02/28/20 1316  BP: 112/82 127/85  Pulse: 77 68  Resp: 16 13  Temp:    SpO2: 94% 98%    Last Pain:  Vitals:   02/28/20 1316  TempSrc:   PainSc: 0-No pain                 Corinda Gubler

## 2020-02-28 NOTE — Anesthesia Procedure Notes (Signed)
Performed by: Cook-Martin, Shiva Karis Pre-anesthesia Checklist: Patient identified, Emergency Drugs available, Suction available, Patient being monitored and Timeout performed Patient Re-evaluated:Patient Re-evaluated prior to induction Oxygen Delivery Method: Nasal cannula Preoxygenation: Pre-oxygenation with 100% oxygen Induction Type: IV induction Airway Equipment and Method: Bite block Placement Confirmation: positive ETCO2 and CO2 detector       

## 2020-02-28 NOTE — H&P (Signed)
Outpatient short stay form Pre-procedure 02/28/2020 12:16 PM Raylene Miyamoto MD, MPH  Primary Physician: [facility]  Reason for visit:  Dyspepsia/Heart Burn  History of present illness:  Has been having bloating/heart burn symptoms. On eliquis/plavix and has been held for 5 days both.     Current Facility-Administered Medications:  .  lidocaine (PF) (XYLOCAINE) 1 % injection, , , ,  .  0.9 %  sodium chloride infusion, , Intravenous, Continuous, Jarrell Armond, Hilton Cork, MD  Medications Prior to Admission  Medication Sig Dispense Refill Last Dose  . atorvastatin (LIPITOR) 10 MG tablet Take 1 tablet (10 mg total) by mouth daily. Additional refills per patient's primary care provider 30 tablet 1 02/27/2020 at Unknown time  . ergocalciferol (VITAMIN D2) 1.25 MG (50000 UT) capsule Take by mouth.   Past Week at Unknown time  . NOVOLIN N 100 UNIT/ML injection    02/27/2020 at Unknown time  . pregabalin (LYRICA) 75 MG capsule Take by mouth.   02/27/2020 at Unknown time  . Blood Glucose Monitoring Suppl (ONETOUCH VERIO REFLECT) w/Device KIT USE TO TEST BLOOD GLUCOSE     . Blood Glucose Monitoring Suppl (ONETOUCH VERIO) w/Device KIT Use to test blood glucose     . clopidogrel (PLAVIX) 75 MG tablet Take 1 tablet (75 mg total) by mouth daily. 30 tablet 11 02/22/2020  . dapagliflozin propanediol (FARXIGA) 5 MG TABS tablet Take 5 mg by mouth daily.      Marland Kitchen docusate sodium (COLACE) 100 MG capsule Take 1 capsule (100 mg total) by mouth 2 (two) times daily. 60 capsule 2   . ELIQUIS 2.5 MG TABS tablet TAKE 1 TABLET BY MOUTH TWICE A DAY 60 tablet 4 02/22/2020  . metFORMIN (GLUCOPHAGE) 1000 MG tablet Take 1,000 mg by mouth 2 (two) times daily with a meal. (Patient not taking: Reported on 02/28/2020)   Not Taking at Unknown time  . senna (SENOKOT) 8.6 MG TABS tablet Take 1 tablet (8.6 mg total) by mouth at bedtime. 120 tablet 0      Allergies  Allergen Reactions  . Naproxen Anaphylaxis  . Liraglutide  Nausea Only     Past Medical History:  Diagnosis Date  . Diabetes mellitus without complication ([place])   . Peripheral vascular disease ([place])     Review of systems:  Otherwise negative.    Physical Exam  Gen: Alert, oriented. Appears stated age.  HEENT: PERRLA Lungs: no respiratory distress CV: RRR Abd: non-tender, non-distended Ext: No edema    Planned procedures: Proceed with EGD. The patient understands the nature of the planned procedure, indications, risks, alternatives and potential complications including but not limited to bleeding, infection, perforation, damage to internal organs and possible oversedation/side effects from anesthesia. The patient agrees and gives consent to proceed.  Please refer to procedure notes for findings, recommendations and patient disposition/instructions.     Raylene Miyamoto MD, MPH Gastroenterology 02/28/2020  12:16 PM

## 2020-02-29 ENCOUNTER — Encounter: Payer: Self-pay | Admitting: Gastroenterology

## 2020-02-29 LAB — SURGICAL PATHOLOGY

## 2020-03-26 ENCOUNTER — Encounter (INDEPENDENT_AMBULATORY_CARE_PROVIDER_SITE_OTHER): Payer: BC Managed Care – PPO

## 2020-03-26 ENCOUNTER — Ambulatory Visit (INDEPENDENT_AMBULATORY_CARE_PROVIDER_SITE_OTHER): Payer: BC Managed Care – PPO | Admitting: Vascular Surgery

## 2020-06-06 ENCOUNTER — Encounter (INDEPENDENT_AMBULATORY_CARE_PROVIDER_SITE_OTHER): Payer: Self-pay | Admitting: Vascular Surgery

## 2020-06-07 ENCOUNTER — Other Ambulatory Visit (INDEPENDENT_AMBULATORY_CARE_PROVIDER_SITE_OTHER): Payer: Self-pay | Admitting: Vascular Surgery

## 2020-07-24 ENCOUNTER — Inpatient Hospital Stay
Admission: EM | Admit: 2020-07-24 | Discharge: 2020-07-27 | DRG: 441 | Disposition: A | Discharge status: Home / Self Care | Payer: BC Managed Care – PPO | Attending: Obstetrics and Gynecology | Admitting: Obstetrics and Gynecology

## 2020-07-24 ENCOUNTER — Encounter: Payer: Self-pay | Admitting: Emergency Medicine

## 2020-07-24 ENCOUNTER — Other Ambulatory Visit: Payer: Self-pay

## 2020-07-24 ENCOUNTER — Emergency Department: Payer: BC Managed Care – PPO

## 2020-07-24 DIAGNOSIS — Z20822 Contact with and (suspected) exposure to covid-19: Secondary | ICD-10-CM | POA: Diagnosis present

## 2020-07-24 DIAGNOSIS — I160 Hypertensive urgency: Secondary | ICD-10-CM | POA: Diagnosis present

## 2020-07-24 DIAGNOSIS — E119 Type 2 diabetes mellitus without complications: Secondary | ICD-10-CM

## 2020-07-24 DIAGNOSIS — Z7901 Long term (current) use of anticoagulants: Secondary | ICD-10-CM | POA: Diagnosis not present

## 2020-07-24 DIAGNOSIS — Z7902 Long term (current) use of antithrombotics/antiplatelets: Secondary | ICD-10-CM | POA: Diagnosis not present

## 2020-07-24 DIAGNOSIS — K2100 Gastro-esophageal reflux disease with esophagitis, without bleeding: Secondary | ICD-10-CM | POA: Diagnosis present

## 2020-07-24 DIAGNOSIS — R1011 Right upper quadrant pain: Secondary | ICD-10-CM

## 2020-07-24 DIAGNOSIS — K5900 Constipation, unspecified: Secondary | ICD-10-CM | POA: Diagnosis present

## 2020-07-24 DIAGNOSIS — B179 Acute viral hepatitis, unspecified: Secondary | ICD-10-CM | POA: Diagnosis not present

## 2020-07-24 DIAGNOSIS — E1151 Type 2 diabetes mellitus with diabetic peripheral angiopathy without gangrene: Secondary | ICD-10-CM | POA: Diagnosis present

## 2020-07-24 DIAGNOSIS — Z86718 Personal history of other venous thrombosis and embolism: Secondary | ICD-10-CM | POA: Diagnosis not present

## 2020-07-24 DIAGNOSIS — R0600 Dyspnea, unspecified: Principal | ICD-10-CM

## 2020-07-24 DIAGNOSIS — R0609 Other forms of dyspnea: Secondary | ICD-10-CM

## 2020-07-24 DIAGNOSIS — Z79899 Other long term (current) drug therapy: Secondary | ICD-10-CM | POA: Diagnosis not present

## 2020-07-24 DIAGNOSIS — E1142 Type 2 diabetes mellitus with diabetic polyneuropathy: Secondary | ICD-10-CM | POA: Diagnosis present

## 2020-07-24 DIAGNOSIS — E785 Hyperlipidemia, unspecified: Secondary | ICD-10-CM | POA: Diagnosis present

## 2020-07-24 DIAGNOSIS — Z794 Long term (current) use of insulin: Secondary | ICD-10-CM | POA: Diagnosis not present

## 2020-07-24 DIAGNOSIS — Z87891 Personal history of nicotine dependence: Secondary | ICD-10-CM

## 2020-07-24 DIAGNOSIS — K8020 Calculus of gallbladder without cholecystitis without obstruction: Secondary | ICD-10-CM

## 2020-07-24 DIAGNOSIS — K7200 Acute and subacute hepatic failure without coma: Secondary | ICD-10-CM | POA: Diagnosis present

## 2020-07-24 DIAGNOSIS — K920 Hematemesis: Secondary | ICD-10-CM | POA: Diagnosis present

## 2020-07-24 DIAGNOSIS — I739 Peripheral vascular disease, unspecified: Secondary | ICD-10-CM | POA: Diagnosis not present

## 2020-07-24 DIAGNOSIS — K759 Inflammatory liver disease, unspecified: Secondary | ICD-10-CM

## 2020-07-24 DIAGNOSIS — E1165 Type 2 diabetes mellitus with hyperglycemia: Secondary | ICD-10-CM | POA: Diagnosis present

## 2020-07-24 DIAGNOSIS — R197 Diarrhea, unspecified: Secondary | ICD-10-CM

## 2020-07-24 HISTORY — DX: Hyperlipidemia, unspecified: E78.5

## 2020-07-24 HISTORY — DX: Personal history of nicotine dependence: Z87.891

## 2020-07-24 HISTORY — DX: Personal history of other medical treatment: Z92.89

## 2020-07-24 LAB — CBC
HCT: 44.8 % (ref 39.0–52.0)
Hemoglobin: 15.6 g/dL (ref 13.0–17.0)
MCH: 29.8 pg (ref 26.0–34.0)
MCHC: 34.8 g/dL (ref 30.0–36.0)
MCV: 85.7 fL (ref 80.0–100.0)
Platelets: 240 10*3/uL (ref 150–400)
RBC: 5.23 MIL/uL (ref 4.22–5.81)
RDW: 13.2 % (ref 11.5–15.5)
WBC: 4.5 10*3/uL (ref 4.0–10.5)
nRBC: 0 % (ref 0.0–0.2)

## 2020-07-24 LAB — COMPREHENSIVE METABOLIC PANEL
ALT: 1283 U/L — ABNORMAL HIGH (ref 0–44)
AST: 1979 U/L — ABNORMAL HIGH (ref 15–41)
Albumin: 4.3 g/dL (ref 3.5–5.0)
Alkaline Phosphatase: 220 U/L — ABNORMAL HIGH (ref 38–126)
Anion gap: 11 (ref 5–15)
BUN: 15 mg/dL (ref 6–20)
CO2: 22 mmol/L (ref 22–32)
Calcium: 9.3 mg/dL (ref 8.9–10.3)
Chloride: 103 mmol/L (ref 98–111)
Creatinine, Ser: 0.8 mg/dL (ref 0.61–1.24)
GFR, Estimated: >60 mL/min (ref >60)
Glucose, Bld: 201 mg/dL — ABNORMAL HIGH (ref 70–99)
Potassium: 3.9 mmol/L (ref 3.5–5.1)
Sodium: 136 mmol/L (ref 135–145)
Total Bilirubin: 2.2 mg/dL — ABNORMAL HIGH (ref 0.3–1.2)
Total Protein: 7.9 g/dL (ref 6.5–8.1)

## 2020-07-24 LAB — URINALYSIS, COMPLETE (UACMP) WITH MICROSCOPIC
Bacteria, UA: NONE SEEN
Bilirubin Urine: NEGATIVE
Glucose, UA: >=500 mg/dL — AB
Ketones, ur: NEGATIVE mg/dL
Leukocytes,Ua: NEGATIVE
Nitrite: NEGATIVE
Protein, ur: NEGATIVE mg/dL
Specific Gravity, Urine: 1.035 — ABNORMAL HIGH (ref 1.005–1.030)
pH: 5 (ref 5.0–8.0)

## 2020-07-24 LAB — TROPONIN I (HIGH SENSITIVITY)
Troponin I (High Sensitivity): 7 ng/L (ref <18)
Troponin I (High Sensitivity): 9 ng/L (ref <18)

## 2020-07-24 LAB — RESP PANEL BY RT-PCR (FLU A&B, COVID) ARPGX2
Influenza A by PCR: NEGATIVE
Influenza B by PCR: NEGATIVE
SARS Coronavirus 2 by RT PCR: NEGATIVE

## 2020-07-24 LAB — BRAIN NATRIURETIC PEPTIDE: B Natriuretic Peptide: 26.2 pg/mL (ref 0.0–100.0)

## 2020-07-24 LAB — ACETAMINOPHEN LEVEL: Acetaminophen (Tylenol), Serum: <10 ug/mL — ABNORMAL LOW (ref 10–30)

## 2020-07-24 LAB — HEMOGLOBIN AND HEMATOCRIT, BLOOD
HCT: 45 % (ref 39.0–52.0)
Hemoglobin: 15.5 g/dL (ref 13.0–17.0)

## 2020-07-24 LAB — CBG MONITORING, ED: Glucose-Capillary: 153 mg/dL — ABNORMAL HIGH (ref 70–99)

## 2020-07-24 LAB — LIPASE, BLOOD: Lipase: 28 U/L (ref 11–51)

## 2020-07-24 MED ORDER — SENNA 8.6 MG PO TABS
1.0000 | ORAL_TABLET | Freq: Every day | ORAL | Status: DC
  Administered 2020-07-25 – 2020-07-26 (×3): 8.6 mg via ORAL
  Filled 2020-07-24 (×3): qty 1

## 2020-07-24 MED ORDER — SODIUM CHLORIDE 0.9 % IV SOLN: INTRAVENOUS | Status: DC

## 2020-07-24 MED ORDER — PREGABALIN 75 MG PO CAPS
75.0000 mg | ORAL_CAPSULE | Freq: Every day | ORAL | Status: DC
  Administered 2020-07-24 – 2020-07-27 (×4): 75 mg via ORAL
  Filled 2020-07-24 (×4): qty 1

## 2020-07-24 MED ORDER — MORPHINE SULFATE (PF) 2 MG/ML IV SOLN
2.0000 mg | INTRAVENOUS | Status: DC | PRN
  Administered 2020-07-25 – 2020-07-27 (×3): 2 mg via INTRAVENOUS
  Filled 2020-07-24 (×3): qty 1

## 2020-07-24 MED ORDER — ONDANSETRON HCL 4 MG/2ML IJ SOLN
4.0000 mg | Freq: Four times a day (QID) | INTRAMUSCULAR | Status: DC | PRN
  Administered 2020-07-25: 4 mg via INTRAVENOUS
  Filled 2020-07-24: qty 2

## 2020-07-24 MED ORDER — DOCUSATE SODIUM 100 MG PO CAPS
100.0000 mg | ORAL_CAPSULE | Freq: Two times a day (BID) | ORAL | Status: DC
  Administered 2020-07-25 – 2020-07-27 (×5): 100 mg via ORAL
  Filled 2020-07-24 (×5): qty 1

## 2020-07-24 MED ORDER — HEPARIN (PORCINE) 25000 UT/250ML-% IV SOLN
1300.0000 [IU]/h | INTRAVENOUS | Status: DC
  Administered 2020-07-25: 800 [IU]/h via INTRAVENOUS
  Administered 2020-07-26: 1150 [IU]/h via INTRAVENOUS
  Administered 2020-07-27: 1300 [IU]/h via INTRAVENOUS
  Filled 2020-07-24 (×4): qty 250

## 2020-07-24 MED ORDER — LABETALOL HCL 5 MG/ML IV SOLN: 20.0000 mg | INTRAVENOUS | Status: DC | PRN

## 2020-07-24 MED ORDER — VITAMIN D (ERGOCALCIFEROL) 1.25 MG (50000 UNIT) PO CAPS: 50000.0000 [IU] | ORAL_CAPSULE | ORAL | Status: DC

## 2020-07-24 MED ORDER — ONDANSETRON HCL 4 MG PO TABS: 4.0000 mg | ORAL_TABLET | Freq: Four times a day (QID) | ORAL | Status: DC | PRN

## 2020-07-24 NOTE — ED Provider Notes (Addendum)
[facility] Emergency Department Provider Note   ____________________________________________   First MD Initiated Contact with Patient 07/24/20 1743     (approximate)  I have reviewed the triage vital signs and the nursing notes.   HISTORY  Chief Complaint Abdominal Pain and Emesis    HPI Justin Adkins is a 54 y.o. male patient has had central upper abdominal pain for about 3 days with some emesis.  He also reports he has had several months of bloody diarrhea and a lump in his lower right groin had his primary care doctor thinks may be a hernia.  He had a angiogram in his legs that showed blockage this was repaired by [facility] patient stop smoking but since then he has developed a bloody diarrhea and has become short of breath especially with exertion.  He is having daily reflux symptoms that are not responsive to Protonix.  He has not used Tylenol.  Does not drink alcohol.  He has not eaten any raw seafood.  He does have markedly elevated liver function test.  He reports his brother had a heart attack in his 44s.         Past Medical History:  Diagnosis Date  . Diabetes mellitus without complication ([place])   . Peripheral vascular disease [facility])     Patient Active Problem List   Diagnosis Date Noted  . PVD (peripheral vascular disease) ([place]) 05/31/2019  . Popliteal artery occlusion, right ([place]) 05/20/2019  . Atherosclerosis of native arteries of extremity with rest pain ([place]) 05/19/2019  . Hyperlipidemia 05/19/2019  . Hyperlipidemia associated with type 2 diabetes mellitus ([place]) 05/19/2019  . Type 2 diabetes mellitus with peripheral neuropathy ([place]) 01/05/2018  . Other male erectile dysfunction 01/05/2018  . Total or mature cataract 03/05/2017  . Tobacco abuse 05/03/2013    Past Surgical History:  Procedure Laterality Date  . ESOPHAGOGASTRODUODENOSCOPY (EGD) WITH PROPOFOL N/A 02/28/2020   Procedure: ESOPHAGOGASTRODUODENOSCOPY  (EGD) WITH PROPOFOL;  Surgeon: Lesly Rubenstein, MD;  Location: [facility] ENDOSCOPY;  Service: Endoscopy;  Laterality: N/A;  . HERNIA REPAIR    . left foot surgery     neuroma  . LOWER EXTREMITY ANGIOGRAPHY Right 05/25/2019   Procedure: LOWER EXTREMITY ANGIOGRAPHY;  Surgeon: Katha Cabal, MD;  Location: [facility];  Service: Cardiovascular;  Laterality: Right;  . TONSILLECTOMY      Prior to Admission medications   Medication Sig Start Date End Date Taking? Authorizing Provider  atorvastatin (LIPITOR) 10 MG tablet Take 1 tablet (10 mg total) by mouth daily. Additional refills per patient's primary care provider 05/25/19 05/24/20  Schnier, Dolores Lory, MD  Blood Glucose Monitoring Suppl (ONETOUCH VERIO REFLECT) w/Device KIT USE TO TEST BLOOD GLUCOSE 06/30/19   [provider]  Blood Glucose Monitoring Suppl (ONETOUCH VERIO) w/Device KIT Use to test blood glucose 06/30/19   [provider]  clopidogrel (PLAVIX) 75 MG tablet TAKE 1 TABLET BY MOUTH EVERY DAY 06/07/20   Schnier, Dolores Lory, MD  docusate sodium (COLACE) 100 MG capsule Take 1 capsule (100 mg total) by mouth 2 (two) times daily. 11/01/19 10/31/20  Rudene Re, MD  ELIQUIS 2.5 MG TABS tablet TAKE 1 TABLET BY MOUTH TWICE A DAY 12/28/19   Schnier, Dolores Lory, MD  ergocalciferol (VITAMIN D2) 1.25 MG (50000 UT) capsule Take by mouth. 09/08/19   [provider]  metFORMIN (GLUCOPHAGE) 1000 MG tablet Take 1,000 mg by mouth 2 (two) times daily with a meal. Patient not taking:  Reported on 02/28/2020 01/07/19   [provider]  NOVOLIN N 100 UNIT/ML injection  05/27/19   [provider]  pregabalin (LYRICA) 75 MG capsule Take by mouth. 07/11/19 07/10/20  [provider]  senna (SENOKOT) 8.6 MG TABS tablet Take 1 tablet (8.6 mg total) by mouth at bedtime. 11/01/19   Rudene Re, MD    Allergies Naproxen and Liraglutide  Family History  Problem Relation Age of Onset  .  Cancer Father   . Heart attack Brother     Social History Social History   Tobacco Use  . Smoking status: Former Smoker    Packs/day: 1.00    Years: 35.00    Pack years: 35.00  . Smokeless tobacco: Never Used  Vaping Use  . Vaping Use: Never used  Substance Use Topics  . Alcohol use: Yes  . Drug use: No    Review of Systems  Constitutional: No fever/chills Eyes: No visual changes. ENT: No sore throat. Cardiovascular: Denies chest pain. Respiratory: Denies shortness of breath. Gastrointestinal: abdominal pain.   nausea,  diarrhea.  No constipation. Genitourinary: Negative for dysuria. Musculoskeletal: Negative for back pain. Skin: Negative for rash. Neurological: Negative for headaches, focal weakness   ____________________________________________   PHYSICAL EXAM:  VITAL SIGNS: ED Triage Vitals  Enc Vitals Group     BP 07/24/20 1427 137/87     Pulse Rate 07/24/20 1427 95     Resp 07/24/20 1427 18     Temp 07/24/20 1427 98.4 F (36.9 C)     Temp Source 07/24/20 1427 Oral     SpO2 07/24/20 1427 98 %     Weight 07/24/20 1428 175 lb 14.8 oz (79.8 kg)     Height --      Head Circumference --      Peak Flow --      Pain Score 07/24/20 1428 8     Pain Loc --      Pain Edu? --      Excl. in [place]? --     Constitutional: Alert and oriented. Well appearing and in no acute distress. Eyes: Conjunctivae are normal. PER Head: Atraumatic. Nose: No congestion/rhinnorhea. Mouth/Throat: Mucous membranes are moist.  Oropharynx non-erythematous. Neck: No stridor.   Cardiovascular: Normal rate, regular rhythm. Grossly normal heart sounds.  Good peripheral circulation. Respiratory: Normal respiratory effort.  No retractions. Lungs CTAB. Gastrointestinal: Soft some right upper quadrant discomfort with palpation. No distention. No abdominal bruits.  Musculoskeletal: No lower extremity tenderness nor edema.   Neurologic:  Normal speech and language. No gross focal neurologic  deficits are appreciated.  Skin:  Skin is warm, dry and intact. No rash noted.  ____________________________________________   LABS (all labs ordered are listed, but only abnormal results are displayed)  Labs Reviewed  COMPREHENSIVE METABOLIC PANEL - Abnormal; Notable for the following components:      Result Value   Glucose, Bld 201 (*)    AST 1,979 (*)    ALT 1,283 (*)    Alkaline Phosphatase 220 (*)    Total Bilirubin 2.2 (*)    All other components within normal limits  URINALYSIS, COMPLETE (UACMP) WITH MICROSCOPIC - Abnormal; Notable for the following components:   Color, Urine AMBER (*)    APPearance HAZY (*)    Specific Gravity, Urine 1.035 (*)    Glucose, UA >=500 (*)    Hgb urine dipstick SMALL (*)    All other components within normal limits  ACETAMINOPHEN LEVEL - Abnormal; Notable for the  following components:   Acetaminophen (Tylenol), Serum <10 (*)    All other components within normal limits  GASTROINTESTINAL PANEL BY PCR, STOOL (REPLACES STOOL CULTURE)  C DIFFICILE QUICK SCREEN W PCR REFLEX  LIPASE, BLOOD  CBC  BRAIN NATRIURETIC PEPTIDE  HEPATITIS PANEL, ACUTE  TROPONIN I (HIGH SENSITIVITY)  TROPONIN I (HIGH SENSITIVITY)   ____________________________________________  EKG EKG read interpreted by me shows normal sinus rhythm rate of 85 normal axis no acute ST-T wave changes are seen  ____________________________________________  RADIOLOGY Gertha Calkin, personally viewed and evaluated these images (plain radiographs) as part of my medical decision making, as well as reviewing the written report by the radiologist.  ED MD interpretation:    Official radiology report(s): DG Chest Portable 1 View  Result Date: 07/24/2020 CLINICAL DATA:  Shortness of breath EXAM: PORTABLE CHEST 1 VIEW COMPARISON:  04/18/2009 FINDINGS: The heart size and mediastinal contours are within normal limits. Both lungs are clear. The visualized skeletal structures are  unremarkable. IMPRESSION: No active disease. Electronically Signed   By: Donavan Foil M.D.   On: 07/24/2020 18:47   US Abdomen Limited RUQ (LIVER/GB)  Result Date: 07/24/2020 CLINICAL DATA:  Right upper quadrant abdominal pain EXAM: ULTRASOUND ABDOMEN LIMITED RIGHT UPPER QUADRANT COMPARISON:  None. FINDINGS: Gallbladder: The gallbladder is filled with stones. There is no evidence for gallbladder wall thickening. The sonographic Percell Miller sign is negative. Common bile duct: Diameter: 4 mm Liver: No focal lesion identified. Within normal limits in parenchymal echogenicity. Portal vein is patent on color Doppler imaging with normal direction of blood flow towards the liver. Other: None. IMPRESSION: There is cholelithiasis without secondary signs of acute cholecystitis. Electronically Signed   By: Constance Holster M.D.   On: 07/24/2020 19:18    ____________________________________________   PROCEDURES  Procedure(s) performed (including Critical Care): Local care time half an hour.  This includes reviewing the patient's old records speaking with Dr. Haig Prophet and Dr. Delana Meyer Dr. Audery Amel the hospitalist.  I also explained to the patient why we changed from going home with GI and cardiology follow-up to admitting him in to the hospital. Procedures   ____________________________________________   INITIAL IMPRESSION / [place] / ED     Patient reports his shortness of breath with exertion has not changed in the last few weeks.  He did not have any diarrhea here in the emergency room.  I did talk with Dr. Haig Prophet the gastroenterologist on-call.  He will follow up this gentleman for his hepatitis and other problems.  Patient by history at least does not seem to have any problem with his hepatitis being caused by Tylenol or alcohol.  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 ----------------------------------------- 8:26 PM on  07/24/2020 -----------------------------------------  Patient is taking Lipitor which can cause hepatitis and also according to up-to-date Plavix and Eliquis can cause hepatitis.  I discussed him with Dr. Delana Meyer [facility] who said the Plavix and Eliquis are for his leg where he had his arterial blockage.  Dr. Delana Meyer felt that if we did admit him we could take him off of both and put him on heparin for the time being.  I then discussed him with the hospitalist and because he is on 3 different medicines that can cause hepatitis and because his LFTs are approximately 20 times normal we think it might be safer to actually put him in the hospital and then take him at  least off the Lipitor with some hydration to see how he does.  We will try to get cardiology consult on him for his family history known peripheral vascular disease and of course of shortness of breath with exertion.  Gastroenterology can then follow-up while with him in the hospital.  His acute hepatitis panel is pending.        ____________________________________________   FINAL CLINICAL IMPRESSION(S) / ED DIAGNOSES  Final diagnoses:  Dyspnea on exertion  Bloody diarrhea  Hepatitis  Calculus of gallbladder without cholecystitis without obstruction     ED Discharge Orders    None      *Please note:  Justin Adkins was evaluated in Emergency Department on 07/24/2020 for the symptoms described in the history of present illness. He was evaluated in the context of the global COVID-19 pandemic, which necessitated consideration that the patient might be at risk for infection with the SARS-CoV-2 virus that causes COVID-19. Institutional protocols and algorithms that pertain to the evaluation of patients at risk for COVID-19 are in a state of rapid change based on information released by regulatory bodies including the CDC and federal and state organizations. These policies and algorithms were followed during the  patient's care in the ED.  Some ED evaluations and interventions may be delayed as a result of limited staffing during and the pandemic.*   Note:  This document was prepared using Dragon voice recognition software and may include unintentional dictation errors.    Nena Polio, MD 07/24/20 2032    Nena Polio, MD 07/24/20 2033

## 2020-07-24 NOTE — Discharge Instructions (Signed)
Please return for increasing shortness of breath, worse belly pain yellow jaundice eyes or skin turning yellow or any other problems.  Please follow-up with gastroenterology for the hepatitis that is going on.  Dr. Mia Creek is on-call.  His staff is supposed to give you a call to set up an appointment.  If they do not do that within the next 2 days please call the office at the number that I supplied.  Also please call cardiology.  Dr. Ladona Ridgel is on-call for [facility].  They also have an office you are on [address] in [place].  I would like you to call the cardiology doctors first thing in the morning.  Let them know that you are having shortness of breath with exertion and some upper abdominal pain and was seen in the emergency room for it.  They should be out of see you fairly quickly.  Let them know about the blockage you have in the artery in your leg and your brother's history of heart attack.

## 2020-07-24 NOTE — ED Notes (Signed)
Took over care of pt. Pt resting comfortably. Pt in AND at this time. VSS. Awaiting further orders. Will continue to monitor.

## 2020-07-24 NOTE — ED Triage Notes (Addendum)
Pt in w/sharp central upper abdominal pain x 3 days, also having dark colored emesis, thinks it may be blood. Denies any ETOH use, diarrhea or fevers. Hx of GERD, DMI and sugars well-controlled

## 2020-07-24 NOTE — H&P (Addendum)
[place]   PATIENT NAME: Justin Adkins    MR#:  [id number]  DATE OF BIRTH:  10-08-1965  DATE OF ADMISSION:  07/24/2020  PRIMARY CARE PHYSICIAN: [facility]   REQUESTING/REFERRING PHYSICIAN: Conni Slipper, MD  CHIEF COMPLAINT:   Chief Complaint  Patient presents with  . Abdominal Pain  . Emesis    HISTORY OF PRESENT ILLNESS:  Justin Adkins  is a 54 y.o. Caucasian male with a known history of type 2 diabetes mellitus, peripheral vascular disease, dyslipidemia and peripheral vascular disease, who presented to the emergency room with acute onset of epigastric and right upper quadrant abdominal pain that started Sunday evening with nausea on Saturday morning and vomiting starting on Saturday evening.  He stated that last night around 3 AM he woke up with vomiting of brownish fluid without bright red blood.  He has been having chronic mucousy bloody bowel movements with occasional bright red blood without melena.  He has hemorrhoids.  He stated that he had a colonoscopy in August 2020 at [facility] that they revealed only internal hemorrhoids with recommendation for repeat colonoscopy in 10 years.  He later had an EGD in July of this year that revealed esophagitis.  No chest pain or dyspnea or cough or wheezing.  No dysuria, oliguria or hematuria or flank pain.  No bleeding diathesis.  He works as a Dealer.  No recent toxic exposure.  He is a social alcohol drinker.  He has been taking Lipitor for his dyslipidemia.  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  Upon presentation to the emergency room, vital signs were within normal and later blood pressure was 160/98 and later 179/113 and heart rate was up to 29.  Labs revealed hyperglycemia of 201 and alk phos 220, AST 1979 and ALT 1283 with total bili of 2.2.  CBC-was within normal.  Urinalysis showed more than 500 glucose and was  otherwise unremarkable. Chest x-ray showed no acute cardiopulmonary disease.  Right upper quadrant ultrasound revealed cholelithiasis without secondary signs of acute cholecystitis. EKG showed no sinus rhythm with rate of 85.  Dr. Delana Meyer was contacted about the patient and recommended IV heparin if Eliquis and Plavix will be held off.  The patient will be admitted to a medical bed for further evaluation and management. PAST MEDICAL HISTORY:   Past Medical History:  Diagnosis Date  . Diabetes mellitus without complication ([place])   . Peripheral vascular disease ([place])   -Dyslipidemia  PAST SURGICAL HISTORY:   Past Surgical History:  Procedure Laterality Date  . ESOPHAGOGASTRODUODENOSCOPY (EGD) WITH PROPOFOL N/A 02/28/2020   Procedure: ESOPHAGOGASTRODUODENOSCOPY (EGD) WITH PROPOFOL;  Surgeon: Lesly Rubenstein, MD;  Location: [facility] ENDOSCOPY;  Service: Endoscopy;  Laterality: N/A;  . HERNIA REPAIR    . left foot surgery     neuroma  . LOWER EXTREMITY ANGIOGRAPHY Right 05/25/2019   Procedure: LOWER EXTREMITY ANGIOGRAPHY;  Surgeon: Katha Cabal, MD;  Location: [facility];  Service: Cardiovascular;  Laterality: Right;  . TONSILLECTOMY      SOCIAL HISTORY:   Social History   Tobacco Use  . Smoking status: Former Smoker    Packs/day: 1.00    Years: 35.00    Pack years: 35.00  . Smokeless tobacco: Never Used  Substance Use Topics  . Alcohol use: Yes    FAMILY HISTORY:   Family History  Problem Relation Age of  Onset  . Cancer Father   . Heart attack Brother     DRUG ALLERGIES:   Allergies  Allergen Reactions  . Naproxen Anaphylaxis  . Liraglutide Nausea Only    REVIEW OF SYSTEMS:   ROS As per history of present illness. All pertinent systems were reviewed above. Constitutional, HEENT, cardiovascular, respiratory, GI, GU, musculoskeletal, neuro, psychiatric, endocrine, integumentary and hematologic systems were reviewed and are otherwise  negative/unremarkable except for positive findings mentioned above in the HPI.   MEDICATIONS AT HOME:   Prior to Admission medications   Medication Sig Start Date End Date Taking? Authorizing Provider  atorvastatin (LIPITOR) 10 MG tablet Take 1 tablet (10 mg total) by mouth daily. Additional refills per patient's primary care provider 05/25/19 05/24/20  Schnier, Dolores Lory, MD  Blood Glucose Monitoring Suppl (ONETOUCH VERIO REFLECT) w/Device KIT USE TO TEST BLOOD GLUCOSE 06/30/19   [provider]  Blood Glucose Monitoring Suppl (ONETOUCH VERIO) w/Device KIT Use to test blood glucose 06/30/19   [provider]  clopidogrel (PLAVIX) 75 MG tablet TAKE 1 TABLET BY MOUTH EVERY DAY 06/07/20   Schnier, Dolores Lory, MD  docusate sodium (COLACE) 100 MG capsule Take 1 capsule (100 mg total) by mouth 2 (two) times daily. 11/01/19 10/31/20  Rudene Re, MD  ELIQUIS 2.5 MG TABS tablet TAKE 1 TABLET BY MOUTH TWICE A DAY 12/28/19   Schnier, Dolores Lory, MD  ergocalciferol (VITAMIN D2) 1.25 MG (50000 UT) capsule Take by mouth. 09/08/19   [provider]  metFORMIN (GLUCOPHAGE) 1000 MG tablet Take 1,000 mg by mouth 2 (two) times daily with a meal. Patient not taking: Reported on 02/28/2020 01/07/19   [provider]  NOVOLIN N 100 UNIT/ML injection  05/27/19   [provider]  pregabalin (LYRICA) 75 MG capsule Take by mouth. 07/11/19 07/10/20  [provider]  senna (SENOKOT) 8.6 MG TABS tablet Take 1 tablet (8.6 mg total) by mouth at bedtime. 11/01/19   Rudene Re, MD      VITAL SIGNS:  Blood pressure (!) 139/91, pulse 78, temperature 98.2 F (36.8 C), temperature source Oral, resp. rate (!) 25, weight 79.8 kg, SpO2 98 %.  PHYSICAL EXAMINATION:  Physical Exam  GENERAL:  54 y.o.-year-old Caucasian male patient lying in the bed with no acute distress.  EYES: Pupils equal, round, reactive to light and accommodation. No scleral icterus. Extraocular  muscles intact.  HEENT: Head atraumatic, normocephalic. Oropharynx and nasopharynx clear.  NECK:  Supple, no jugular venous distention. No thyroid enlargement, no tenderness.  LUNGS: Normal breath sounds bilaterally, no wheezing, rales,rhonchi or crepitation. No use of accessory muscles of respiration.  CARDIOVASCULAR: Regular rate and rhythm, S1, S2 normal. No murmurs, rubs, or gallops.  ABDOMEN: Soft, nondistended, with epigastric and right upper quadrant tenderness without rebound tenderness guarding or rigidity bowel sounds present. No organomegaly or mass.  EXTREMITIES: No pedal edema, cyanosis, or clubbing.  NEUROLOGIC: Cranial nerves II through XII are intact. Muscle strength 5/5 in all extremities. Sensation intact. Gait not checked.  PSYCHIATRIC: The patient is alert and oriented x 3.  Normal affect and good eye contact. SKIN: No obvious rash, lesion, or ulcer.   LABORATORY PANEL:   CBC Recent Labs  Lab 07/24/20 1430  WBC 4.5  HGB 15.6  HCT 44.8  PLT 240   ------------------------------------------------------------------------------------------------------------------  Chemistries  Recent Labs  Lab 07/24/20 1430  NA 136  K 3.9  CL 103  CO2 22  GLUCOSE 201*  BUN 15  CREATININE 0.80  CALCIUM 9.3  AST 1,979*  ALT 1,283*  ALKPHOS 220*  BILITOT 2.2*   ------------------------------------------------------------------------------------------------------------------  Cardiac Enzymes No results for input(s): TROPONINI in the last 168 hours. ------------------------------------------------------------------------------------------------------------------  RADIOLOGY:  DG Chest Portable 1 View  Result Date: 07/24/2020 CLINICAL DATA:  Shortness of breath EXAM: PORTABLE CHEST 1 VIEW COMPARISON:  04/18/2009 FINDINGS: The heart size and mediastinal contours are within normal limits. Both lungs are clear. The visualized skeletal structures are unremarkable. IMPRESSION: No  active disease. Electronically Signed   By: Donavan Foil M.D.   On: 07/24/2020 18:47   US Abdomen Limited RUQ (LIVER/GB)  Result Date: 07/24/2020 CLINICAL DATA:  Right upper quadrant abdominal pain EXAM: ULTRASOUND ABDOMEN LIMITED RIGHT UPPER QUADRANT COMPARISON:  None. FINDINGS: Gallbladder: The gallbladder is filled with stones. There is no evidence for gallbladder wall thickening. The sonographic Percell Miller sign is negative. Common bile duct: Diameter: 4 mm Liver: No focal lesion identified. Within normal limits in parenchymal echogenicity. Portal vein is patent on color Doppler imaging with normal direction of blood flow towards the liver. Other: None. IMPRESSION: There is cholelithiasis without secondary signs of acute cholecystitis. Electronically Signed   By: Constance Holster M.D.   On: 07/24/2020 19:18      IMPRESSION AND PLAN:   1.  Acute hepatitis.  Differential diagnosis including viral hepatitis, drug-induced and autoimmune.  He has been having chronic bloody diarrhea.  Hemoglobin and hematocrit are normal here and has not had any bowel movement in the ER. -The patient will be admitted to a medical bed. -We will stop his Lipitor, Metformin as well as Eliquis and Plavix as all can raise LFTs and given his bloody diarrhea. -We will obtain acute viral hepatitis panel. -He will be placed on IV heparin to replace Plavix and Eliquis. -We will follow serial LFTs and hemoglobins and hematocrits.   -GI consultation will be obtained. -I notified Dr. Haig Prophet about the patient.  2.  Hypertensive urgency. -This likely secondary to pain. -He will be placed on as needed IV labetalol.  3.  Peripheral vascular disease. -He will be placed on IV heparin to replace aspirin and Plavix for now.  4.  Type 2 diabetes mellitus. -We will place the patient on supplement coverage with NovoLog and continue his [place] and Trulicity.   -Metformin will be held off.  5.  Dyslipidemia. -Statin therapy  will be held off and LFTs will be followed.  6.  DVT prophylaxis. -The patient will be placed on IV heparin.  All the records are reviewed and case discussed with ED provider. The plan of care was discussed in details with the patient (and family). I answered all questions. The patient agreed to proceed with the above mentioned plan. Further management will depend upon hospital course.   CODE STATUS: Full code  Status is: Inpatient  Remains inpatient appropriate because:Ongoing active pain requiring inpatient pain management, Ongoing diagnostic testing needed not appropriate for outpatient work up, Unsafe d/c plan, IV treatments appropriate due to intensity of illness or inability to take PO and Inpatient level of care appropriate due to severity of illness   Dispo: The patient is from: Home              Anticipated d/c is to: Home              Anticipated d/c date is: 3 days              Patient currently is not medically stable to d/c.   TOTAL  TIME TAKING CARE OF THIS PATIENT: 55 minutes.    Christel Mormon M.D on 07/24/2020 at 8:27 PM  [facility]   From 7 PM-7 AM, contact night-coverage [email or web address]  CC: Primary care physician; [facility]

## 2020-07-24 NOTE — ED Notes (Signed)
Heparin not verified at this time. Awaiting for pharmacy verification to start.

## 2020-07-25 ENCOUNTER — Inpatient Hospital Stay
Admit: 2020-07-25 | Discharge: 2020-07-25 | Disposition: A | Discharge status: Home / Self Care | Payer: BC Managed Care – PPO | Attending: Obstetrics and Gynecology | Admitting: Obstetrics and Gynecology

## 2020-07-25 DIAGNOSIS — B179 Acute viral hepatitis, unspecified: Principal | ICD-10-CM

## 2020-07-25 LAB — APTT
aPTT: 33 seconds (ref 24–36)
aPTT: 37 seconds — ABNORMAL HIGH (ref 24–36)

## 2020-07-25 LAB — CBC
HCT: 40.6 % (ref 39.0–52.0)
HCT: 41.5 % (ref 39.0–52.0)
Hemoglobin: 14.2 g/dL (ref 13.0–17.0)
Hemoglobin: 14.5 g/dL (ref 13.0–17.0)
MCH: 30.4 pg (ref 26.0–34.0)
MCH: 30.5 pg (ref 26.0–34.0)
MCHC: 35 g/dL (ref 30.0–36.0)
MCHC: 34.9 g/dL (ref 30.0–36.0)
MCV: 86.9 fL (ref 80.0–100.0)
MCV: 87.4 fL (ref 80.0–100.0)
Platelets: 201 10*3/uL (ref 150–400)
Platelets: 184 10*3/uL (ref 150–400)
RBC: 4.67 MIL/uL (ref 4.22–5.81)
RBC: 4.75 MIL/uL (ref 4.22–5.81)
RDW: 13.5 % (ref 11.5–15.5)
RDW: 13.6 % (ref 11.5–15.5)
WBC: 4.5 10*3/uL (ref 4.0–10.5)
WBC: 3.9 10*3/uL — ABNORMAL LOW (ref 4.0–10.5)
nRBC: 0 % (ref 0.0–0.2)
nRBC: 0 % (ref 0.0–0.2)

## 2020-07-25 LAB — COMPREHENSIVE METABOLIC PANEL
ALT: 992 U/L — ABNORMAL HIGH (ref 0–44)
AST: 651 U/L — ABNORMAL HIGH (ref 15–41)
Albumin: 3.8 g/dL (ref 3.5–5.0)
Alkaline Phosphatase: 223 U/L — ABNORMAL HIGH (ref 38–126)
Anion gap: 9 (ref 5–15)
BUN: 15 mg/dL (ref 6–20)
CO2: 25 mmol/L (ref 22–32)
Calcium: 8.4 mg/dL — ABNORMAL LOW (ref 8.9–10.3)
Chloride: 104 mmol/L (ref 98–111)
Creatinine, Ser: 0.83 mg/dL (ref 0.61–1.24)
GFR, Estimated: >60 mL/min (ref >60)
Glucose, Bld: 219 mg/dL — ABNORMAL HIGH (ref 70–99)
Potassium: 3.9 mmol/L (ref 3.5–5.1)
Sodium: 138 mmol/L (ref 135–145)
Total Bilirubin: 1.8 mg/dL — ABNORMAL HIGH (ref 0.3–1.2)
Total Protein: 7.1 g/dL (ref 6.5–8.1)

## 2020-07-25 LAB — HEMOGLOBIN AND HEMATOCRIT, BLOOD
HCT: 43 % (ref 39.0–52.0)
HCT: 42 % (ref 39.0–52.0)
Hemoglobin: 14.6 g/dL (ref 13.0–17.0)
Hemoglobin: 14.3 g/dL (ref 13.0–17.0)

## 2020-07-25 LAB — IRON AND TIBC
Iron: 174 ug/dL (ref 45–182)
Saturation Ratios: 52 % — ABNORMAL HIGH (ref 17.9–39.5)
TIBC: 337 ug/dL (ref 250–450)
UIBC: 163 ug/dL

## 2020-07-25 LAB — GLUCOSE, CAPILLARY
Glucose-Capillary: 205 mg/dL — ABNORMAL HIGH (ref 70–99)
Glucose-Capillary: 164 mg/dL — ABNORMAL HIGH (ref 70–99)
Glucose-Capillary: 244 mg/dL — ABNORMAL HIGH (ref 70–99)
Glucose-Capillary: 154 mg/dL — ABNORMAL HIGH (ref 70–99)
Glucose-Capillary: 241 mg/dL — ABNORMAL HIGH (ref 70–99)

## 2020-07-25 LAB — HEMOGLOBIN A1C
Hgb A1c MFr Bld: 6.9 % — ABNORMAL HIGH (ref 4.8–5.6)
Mean Plasma Glucose: 151.33 mg/dL

## 2020-07-25 LAB — HEPATITIS PANEL, ACUTE
HCV Ab: NONREACTIVE
Hep A IgM: NONREACTIVE
Hep B C IgM: NONREACTIVE
Hepatitis B Surface Ag: NONREACTIVE

## 2020-07-25 LAB — CK: Total CK: 59 U/L (ref 49–397)

## 2020-07-25 LAB — HEPARIN LEVEL (UNFRACTIONATED)
Heparin Unfractionated: 0.16 IU/mL — ABNORMAL LOW (ref 0.30–0.70)
Heparin Unfractionated: 0.36 IU/mL (ref 0.30–0.70)

## 2020-07-25 LAB — FERRITIN: Ferritin: 1334 ng/mL — ABNORMAL HIGH (ref 24–336)

## 2020-07-25 LAB — TSH: TSH: 0.76 u[IU]/mL (ref 0.350–4.500)

## 2020-07-25 LAB — HIV ANTIBODY (ROUTINE TESTING W REFLEX): HIV Screen 4th Generation wRfx: NONREACTIVE

## 2020-07-25 MED ORDER — INSULIN ASPART 100 UNIT/ML ~~LOC~~ SOLN
0.0000 [IU] | Freq: Three times a day (TID) | SUBCUTANEOUS | Status: DC
  Administered 2020-07-25: 3 [IU] via SUBCUTANEOUS
  Administered 2020-07-25: 5 [IU] via SUBCUTANEOUS
  Administered 2020-07-26: 3 [IU] via SUBCUTANEOUS
  Administered 2020-07-26: 5 [IU] via SUBCUTANEOUS
  Administered 2020-07-27: 2 [IU] via SUBCUTANEOUS
  Administered 2020-07-27: 5 [IU] via SUBCUTANEOUS
  Filled 2020-07-25 (×5): qty 1

## 2020-07-25 MED ORDER — HEPARIN BOLUS VIA INFUSION
2300.0000 [IU] | Freq: Once | INTRAVENOUS | Status: AC
  Administered 2020-07-25: 2300 [IU] via INTRAVENOUS
  Filled 2020-07-25: qty 2300

## 2020-07-25 MED ORDER — INSULIN GLARGINE 100 UNIT/ML ~~LOC~~ SOLN
10.0000 [IU] | Freq: Every day | SUBCUTANEOUS | Status: DC
  Administered 2020-07-25 – 2020-07-26 (×2): 10 [IU] via SUBCUTANEOUS
  Filled 2020-07-25 (×2): qty 0.1

## 2020-07-25 NOTE — Consult Note (Signed)
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 Naproxen Anaphylaxis  . Liraglutide Nausea Only    Patient Measurements: Height: 5\' 9"  (175.3 cm) Weight: 79.7 kg (175 lb 12.8 oz) IBW/kg (Calculated) : 70.7 Heparin Dosing Weight: 79.7 kg  Vital Signs: Temp: 98.1 F (36.7 C) (12/08 1558) Temp Source: Oral (12/08 0735) BP: 128/73 (12/08 1558) Pulse Rate: 73 (12/08 1558)  Labs: Recent Labs    07/24/20 1430 07/24/20 2126 07/24/20 2130 07/25/20 0332 07/25/20 0332 07/25/20 0631 07/25/20 0631 07/25/20 0632 07/25/20 0840 07/25/20 1500  HGB 15.6  --    < > 14.2   < > 14.5   < >  --  14.6 14.3  HCT 44.8  --    < > 40.6   < > 41.5  --   --  43.0 42.0  PLT 240  --   --  201  --  184  --   --   --   --   APTT  --   --   --   --   --  33  --   --   --  37*  HEPARINUNFRC  --   --   --   --   --   --   --   --   --  0.16*  CREATININE 0.80  --   --  0.83  --   --   --   --   --   --   CKTOTAL  --   --   --   --   --   --   --  59  --   --   TROPONINIHS 7 9  --   --   --   --   --   --   --   --    < > = values in this interval not displayed.    Estimated Creatinine Clearance: 101.7 mL/min (by C-G formula based on SCr of 0.83 mg/dL).   Medical History: Past Medical History:  Diagnosis Date  . Diabetes mellitus without complication ([facility])   . Peripheral vascular disease ([facility])     Medications:  Medications Prior to Admission  Medication Sig Dispense Refill Last Dose  . clopidogrel (PLAVIX) 75 MG tablet TAKE 1 TABLET BY MOUTH EVERY DAY (Patient taking differently: Take 75 mg by mouth daily. ) 90 tablet 3 07/24/2020 at 0600  . ELIQUIS 2.5 MG TABS tablet TAKE 1 TABLET BY MOUTH TWICE A DAY (Patient taking differently: Take 2.5 mg by mouth 2 (two) times daily. ) 60 tablet 4 07/24/2020 at 0600  . FARXIGA 10 MG TABS tablet Take 10 mg by mouth daily.   07/24/2020 at 0600   . NOVOLIN N 100 UNIT/ML injection Inject 0-30 Units into the skin 2 (two) times daily before a meal.    07/23/2020 at 2300  . pantoprazole (PROTONIX) 40 MG tablet Take 40 mg by mouth 2 (two) times daily.   07/24/2020 at 0600  . docusate sodium (COLACE) 100 MG capsule Take 1 capsule (100 mg total) by mouth 2 (two) times daily. 60 capsule 2 unknown at prn  . OZEMPIC, 0.25 OR 0.5 MG/DOSE, 2 MG/1.5ML SOPN Inject 0.5 mg into the skin once a week.   07/21/2020 at 0900   Scheduled:  . docusate sodium  100 mg Oral BID  . insulin aspart  0-15 Units Subcutaneous TID WC  .  insulin glargine  10 Units Subcutaneous Daily  . pregabalin  75 mg Oral Daily  . senna  1 tablet Oral QHS  . [START ON 07/29/2020] Vitamin D (Ergocalciferol)  50,000 Units Oral Weekly   Infusions:  . sodium chloride 100 mL/hr at 07/25/20 0041  . heparin 950 Units/hr (07/25/20 0928)   PRN: labetalol, morphine injection, ondansetron **OR** ondansetron (ZOFRAN) IV Anti-infectives (From admission, onward)   None      Assessment: Pharmacy consulted to start heparin. Pt is on apixaban PTA. No baseline heparin level ordered.   12/8 0655 aPTT 33  12/8 1500 aPTT 37 HL 0.16  Goal of Therapy:  Heparin level 0.3-0.7 units/ml   Monitor platelets by anticoagulation protocol: Yes   Plan:  APTT and heparin level is subtherapeutic. Will give 2300 unit heparin bolus and increase heparin infusion to 1150 units/hr. Will order heparin level in 6 hours. CBC daily while on heparin.   Ronnald Ramp, PharmD, BCPS 07/25/2020,4:07 PM

## 2020-07-25 NOTE — Consult Note (Signed)
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 Naproxen Anaphylaxis  . Liraglutide Nausea Only    Patient Measurements: Height: 5\' 9"  (175.3 cm) Weight: 79.7 kg (175 lb 12.8 oz) IBW/kg (Calculated) : 70.7 Heparin Dosing Weight: 79.7 kg  Vital Signs: Temp: 97.8 F (36.6 C) (12/08 0423) Temp Source: Oral (12/08 0423) BP: 105/76 (12/08 0423) Pulse Rate: 76 (12/08 0423)  Labs: Recent Labs    07/24/20 1430 07/24/20 1430 07/24/20 2126 07/24/20 2130 07/24/20 2130 07/25/20 0332 07/25/20 0631  HGB 15.6   < >  --  15.5   < > 14.2 14.5  HCT 44.8   < >  --  45.0  --  40.6 41.5  PLT 240  --   --   --   --  201 184  APTT  --   --   --   --   --   --  33  CREATININE 0.80  --   --   --   --  0.83  --   TROPONINIHS 7  --  9  --   --   --   --    < > = values in this interval not displayed.    Estimated Creatinine Clearance: 101.7 mL/min (by C-G formula based on SCr of 0.83 mg/dL).   Medical History: Past Medical History:  Diagnosis Date  . Diabetes mellitus without complication ([facility])   . Peripheral vascular disease ([facility])     Medications:  Medications Prior to Admission  Medication Sig Dispense Refill Last Dose  . clopidogrel (PLAVIX) 75 MG tablet TAKE 1 TABLET BY MOUTH EVERY DAY (Patient taking differently: Take 75 mg by mouth daily. ) 90 tablet 3 07/24/2020 at 0600  . ELIQUIS 2.5 MG TABS tablet TAKE 1 TABLET BY MOUTH TWICE A DAY (Patient taking differently: Take 2.5 mg by mouth 2 (two) times daily. ) 60 tablet 4 07/24/2020 at 0600  . FARXIGA 10 MG TABS tablet Take 10 mg by mouth daily.   07/24/2020 at 0600  . NOVOLIN N 100 UNIT/ML injection Inject 0-30 Units into the skin 2 (two) times daily before a meal.    07/23/2020 at 2300  . pantoprazole (PROTONIX) 40 MG tablet Take 40 mg by mouth 2 (two) times daily.   07/24/2020 at 0600  . docusate sodium (COLACE) 100  MG capsule Take 1 capsule (100 mg total) by mouth 2 (two) times daily. 60 capsule 2 unknown at prn  . OZEMPIC, 0.25 OR 0.5 MG/DOSE, 2 MG/1.5ML SOPN Inject 0.5 mg into the skin once a week.   07/21/2020 at 0900   Scheduled:  . docusate sodium  100 mg Oral BID  . pregabalin  75 mg Oral Daily  . senna  1 tablet Oral QHS  . [START ON 07/29/2020] Vitamin D (Ergocalciferol)  50,000 Units Oral Weekly   Infusions:  . sodium chloride 100 mL/hr at 07/25/20 0041  . heparin 800 Units/hr (07/25/20 0058)   PRN: labetalol, morphine injection, ondansetron **OR** ondansetron (ZOFRAN) IV Anti-infectives (From admission, onward)   None      Assessment: Pharmacy consulted to start heparin. Pt is on apixaban PTA. No baseline heparin level ordered.   12/8 0655 aPTT 33   Goal of Therapy:  Heparin level 0.3-0.7 units/ml when aPTT and heparin level correlate.  aPTT 66-102 seconds Monitor platelets by anticoagulation protocol: Yes   Plan:  APTT is subtherapeutic. Will increase  heparin infusion to 950 units/hr. Will order heparin level and aPTT level in 6 hours. CBC daily while on heparin.   Ronnald Ramp, PharmD, BCPS 07/25/2020,7:33 AM

## 2020-07-25 NOTE — Consult Note (Signed)
GI Inpatient Consult Note  Reason for Consult: Acute Hepatitis   Attending Requesting Consult: Dr. Laurey Arrow  History of Present Illness: Justin Adkins is a 54 y.o. male seen for evaluation of acute hepatitis, elevated LFTs at the request of Dr. Laurey Arrow. Pt has a PMH of HLD, T2DM, PVD s/p thrombectomy for right popliteal artery thrombosis. He presented to the [facility] ED last night for worsening epigastric and RUQ abdominal pain with non-intractable nausea and vomiting. Pain initially started on Sunday evening and has waxed and waned since this time, but was much worse last night before coming to the ED. Upon presentation to the ED, he was found to have severely elevated LFTs with AST 1979, ALT 1283, alk phos 220, and tbili 2.2. CBC was within normal limits with no anemia or leukocytosis. UA showed >500 glucose. RUQ US showed cholelithiasis without signs for cholecystitis. Viral hepatitis panel was negative. Acetaminophen level normal. Patient denies any previous hx of elevated LFTs. No known family history of chronic liver disease. He is on both Eliquis and Plavix at home due to his hx of PVD and recent thrombectomy. He denies any frequent alcohol consumption. He says he is a social drinker and last drink was >3 months ago. He quit smoking about 18 months ago. He denies any recent antibiotic use. No new vitamins, supplements, or OTC products. He denies any acute liver-related symptoms such as new skin rashes, lower extremity edema, pruritus, abdominal swelling, mental status changes. He has been having issues with chronic mucousy bloody bowel movements for the past year. He reports he was seen as outpatient at [place] and saw [facility] and Dr. Raylene Miyamoto where he had EGD and colonoscopy performed within the last year. He reports he was told he has internal and external hemorrhoids, but otherwise a normal examined colon. He has seen Dr. Windell Moment in the outpatient setting for  discussion on treatment options for hemorrhoids and he was advised to work on fiber and improving bowel habits first. Patient reports he has not been diligent about adding a lot of fiber to his diet. He does deal with frequent heartburn and reflux and is on daily Pantoprazole 40 mg daily at home. EGD was significant for esophagitis and peptic duodenitis. In regards to his family history, he reports he did have a brother who had sudden heart attack at 8 years old. Patient reports his abdominal pain has resolved and is nausea is well controlled with no recurrent episodes of emesis. LFTs rechecked this morning and were improved with AST 651, ALT 992, alk phos 223, and tbili 1.8.   Past Medical History:  Past Medical History:  Diagnosis Date  . Diabetes mellitus without complication ([place])   . Peripheral vascular disease ([place])     Problem List: Patient Active Problem List   Diagnosis Date Noted  . Acute hepatitis 07/24/2020  . PVD (peripheral vascular disease) ([place]) 05/31/2019  . 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 Atherosclerosis of native arteries of extremity with rest pain ([place]) 05/19/2019  . Hyperlipidemia 05/19/2019  . Hyperlipidemia associated with type 2 diabetes mellitus ([place]) 05/19/2019  . Type 2 diabetes mellitus with peripheral neuropathy ([place]) 01/05/2018  . Other male erectile dysfunction 01/05/2018  . Total or mature cataract 03/05/2017  . Tobacco abuse 05/03/2013    Past Surgical History: Past Surgical History:  Procedure Laterality Date  . ESOPHAGOGASTRODUODENOSCOPY (EGD) WITH PROPOFOL N/A 02/28/2020   Procedure: ESOPHAGOGASTRODUODENOSCOPY (EGD) WITH PROPOFOL;  Surgeon: Lesly Rubenstein,  MD;  Location: [facility] ENDOSCOPY;  Service: Endoscopy;  Laterality: N/A;  . HERNIA REPAIR    . left foot surgery     neuroma  . LOWER EXTREMITY ANGIOGRAPHY Right 05/25/2019   Procedure: LOWER EXTREMITY ANGIOGRAPHY;  Surgeon: Katha Cabal, MD;  Location: [place]  CV LAB;  Service: Cardiovascular;  Laterality: Right;  . TONSILLECTOMY      Allergies: Allergies  Allergen Reactions  . Naproxen Anaphylaxis  . Liraglutide Nausea Only    Home Medications: Medications Prior to Admission  Medication Sig Dispense Refill Last Dose  . clopidogrel (PLAVIX) 75 MG tablet TAKE 1 TABLET BY MOUTH EVERY DAY (Patient taking differently: Take 75 mg by mouth daily. ) 90 tablet 3 07/24/2020 at 0600  . ELIQUIS 2.5 MG TABS tablet TAKE 1 TABLET BY MOUTH TWICE A DAY (Patient taking differently: Take 2.5 mg by mouth 2 (two) times daily. ) 60 tablet 4 07/24/2020 at 0600  . FARXIGA 10 MG TABS tablet Take 10 mg by mouth daily.   07/24/2020 at 0600  . NOVOLIN N 100 UNIT/ML injection Inject 0-30 Units into the skin 2 (two) times daily before a meal.    07/23/2020 at 2300  . pantoprazole (PROTONIX) 40 MG tablet Take 40 mg by mouth 2 (two) times daily.   07/24/2020 at 0600  . docusate sodium (COLACE) 100 MG capsule Take 1 capsule (100 mg total) by mouth 2 (two) times daily. 60 capsule 2 unknown at prn  . OZEMPIC, 0.25 OR 0.5 MG/DOSE, 2 MG/1.5ML SOPN Inject 0.5 mg into the skin once a week.   07/21/2020 at 0900   Home medication reconciliation was completed with the patient.   Scheduled Inpatient Medications:   . docusate sodium  100 mg Oral BID  . insulin aspart  0-15 Units Subcutaneous TID WC  . insulin glargine  10 Units Subcutaneous Daily  . pregabalin  75 mg Oral Daily  . senna  1 tablet Oral QHS  . [START ON 07/29/2020] Vitamin D (Ergocalciferol)  50,000 Units Oral Weekly    Continuous Inpatient Infusions:   . sodium chloride 100 mL/hr at 07/25/20 0041  . heparin 1,150 Units/hr (07/25/20 1614)    PRN Inpatient Medications:  labetalol, morphine injection, ondansetron **OR** ondansetron (ZOFRAN) IV  Family History: family history includes Cancer in his father; Heart attack in his brother.  The patient's family history is negative for inflammatory bowel disorders, GI  malignancy, or solid organ transplantation.  Social History:   reports that he has quit smoking. He has a 35.00 pack-year smoking history. He has never used smokeless tobacco. He reports current alcohol use. He reports that he does not use drugs. The patient denies ETOH, tobacco, or drug use.   Review of Systems: Constitutional: Weight is stable.  Eyes: No changes in vision. ENT: No oral lesions, sore throat.  GI: see HPI.  Heme/Lymph: No easy bruising.  CV: No chest pain.  GU: No hematuria.  Integumentary: No rashes.  Neuro: No headaches.  Psych: No depression/anxiety.  Endocrine: No heat/cold intolerance.  Allergic/Immunologic: No urticaria.  Resp: No cough, SOB.  Musculoskeletal: No joint swelling.    Physical Examination: BP 128/73 (BP Location: Left Arm)   Pulse 73   Temp 98.1 F (36.7 C)   Resp 18   Ht '5\' 9"'  (1.753 m)   Wt 79.7 kg   SpO2 97%   BMI 25.96 kg/m  Gen: NAD, alert and oriented x 4 HEENT: PEERLA, EOMI, Neck: supple, no JVD or thyromegaly Chest:  CTA bilaterally, no wheezes, crackles, or other adventitious sounds CV: RRR, no m/g/c/r Abd: soft, NT, ND, +BS in all four quadrants; no HSM, guarding, ridigity, or rebound tenderness Ext: no edema, well perfused with 2+ pulses, Skin: no rash or lesions noted Lymph: no LAD  Data: Lab Results  Component Value Date   WBC 3.9 (L) 07/25/2020   HGB 14.3 07/25/2020   HCT 42.0 07/25/2020   MCV 87.4 07/25/2020   PLT 184 07/25/2020   Recent Labs  Lab 07/25/20 0631 07/25/20 0840 07/25/20 1500  HGB 14.5 14.6 14.3   Lab Results  Component Value Date   NA 138 07/25/2020   K 3.9 07/25/2020   CL 104 07/25/2020   CO2 25 07/25/2020   BUN 15 07/25/2020   CREATININE 0.83 07/25/2020   Lab Results  Component Value Date   ALT 992 (H) 07/25/2020   AST 651 (H) 07/25/2020   ALKPHOS 223 (H) 07/25/2020   BILITOT 1.8 (H) 07/25/2020   Recent Labs  Lab 07/25/20 1500  APTT 37*   Assessment/Plan:  54 y/o  Caucasian male with a PMH of HLD, T2DM, PVD s/p thrombectomy for right popliteal artery thrombosis admitted to [facility] for RUQ/epigastric waxing and waning abdominal pain with non-intractable nausea and vomiting found to have severely elevated LFTs  1. Acute Hepatitis - severely elevated LFTs with initial lab values significant for AST 1979, ALT 1283, alk phos 220, and tbili 2.2. Severely elevated aminotransferases in this fashion are usually indicative of ischemic hepatitis, viral injury, toxins, or autoimmune injury. Viral hepatitis panel negative. Acetaminophen within normal limits. Liver US showed normal liver parenchyma with no focal liver lesions and patent blood flow. His ferritin was elevated and this is an acute phase reactant, but his iron saturation was elevated at 52%. Autoimmune markers pending.  2. PVD on home Eliquis and Plavix - home blood thinners on hold, currently on IV heparin  3. Rectal bleeding - likely anal outlet etiology.   4. Self-reported hematemesis - H&H stable, no recurrence  5. GERD with esophagitis - on Pantoprazole 40 mg daily  Recommendations:  1. Likely ischemic insult to liver, but cannot completely rule out other etiologies such as underlying structural heart disease. I advise a 2d echocardiogram and cardiology consult 2. Check HFE gene mutation given severely elevated ferritin and increased iron saturation at 52% 3. Follow-up on other hepatic serologies 4. Continue to trend LFTs 5. If echo is negative, consider further work-up with HIDA scan  6. Following   Thank you for the consult. Please call with questions or concerns.  Reeves Forth [facility] [phone] (Cell)

## 2020-07-25 NOTE — Progress Notes (Signed)
PROGRESS NOTE    [name]  XTG:[id number] DOB: 1965/10/24 DOA: 07/24/2020 PCP: [facility]  Outpatient Specialists: Arbie Cookey, endocrinology    Brief Narrative:   Justin Adkins  is a 54 y.o. Caucasian male with a known history of type 2 diabetes mellitus, peripheral vascular disease, dyslipidemia and peripheral vascular disease, who presented to the emergency room with acute onset of epigastric and right upper quadrant abdominal pain that started Sunday evening with nausea on Saturday morning and vomiting starting on Saturday evening.  He stated that last night around 3 AM he woke up with vomiting of brownish fluid without bright red blood.  He has been having chronic mucousy bloody bowel movements with occasional bright red blood without melena.  He has hemorrhoids.  He stated that he had a colonoscopy in August 2020 at [facility] that they revealed only internal hemorrhoids with recommendation for repeat colonoscopy in 10 years.  He later had an EGD in July of this year that revealed esophagitis.  No chest pain or dyspnea or cough or wheezing.  No dysuria, oliguria or hematuria or flank pain.  No bleeding diathesis.  He works as a Dealer.  No recent toxic exposure.  He is a social alcohol drinker.  He has been taking Lipitor for his dyslipidemia.  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  Upon presentation to the emergency room, vital signs were within normal and later blood pressure was 160/98 and later 179/113 and heart rate was up to 29.  Labs revealed hyperglycemia of 201 and alk phos 220, AST 1979 and ALT 1283 with total bili of 2.2.  CBC-was within normal.  Urinalysis showed more than 500 glucose and was otherwise unremarkable. Chest x-ray showed no acute cardiopulmonary disease.  Right upper quadrant ultrasound revealed cholelithiasis without secondary signs of acute  cholecystitis. EKG showed no sinus rhythm with rate of 85.  Dr. Delana Meyer was contacted about the patient and recommended IV heparin if Eliquis and Plavix will be held off.  The patient will be admitted to a medical bed for further evaluation and management.   Assessment & Plan:   Active Problems:   Acute hepatitis  Acute hepatitis.   Etiology is unclear. Denies alcohol/drugs. Viral hepatitis panel neg. Tylenol level neg. liver u/s neg including no signs portal vein occlusion. Is on several medications that could cause acute hepatitis. This morning symptoms much improved - RUQ pain almost resolved and no nausea/vomiting, no diarrhea.  LFTs are down-trending. - hold home meds that can cause LFT elevation (lipitor, metformin, eliquis, plavix, semaglutide, pantoprazole)  - check ana, anti-sm, ck, iron panel, tsh - continue to monitor LFTS - GI consultation will be obtained, Dr. Alice Reichert will see the patient today  Hypertensive urgency. Resolved  Peripheral vascular disease. -He will be placed on IV heparin to replace aspirin and Plavix for now.  Type 2 diabetes mellitus. Glucse 200 this morning - hold home semaglutide, novolin, ozempic - start lantus 10 qd, SSI moderate  Dyslipidemia. -Statin therapy will be held off and LFTs will be followed.   DVT prophylaxis: heparin Code Status: full Family Communication: none at bedside  Status is: Inpatient  Remains inpatient appropriate because:Inpatient level of care appropriate due to severity of illness   Dispo: The patient is from: Home              Anticipated d/c is to: Home  Anticipated d/c date is: 0-2 days3              Patient currently is not medically stable to d/c.        Consultants:  GI  Procedures: none  Antimicrobials:  none    Subjective: This morning feeling much better. Epigastric pain mostly resolved. No nausea/vomiting. No diarrhea.   Objective: Vitals:   07/25/20 0000 07/25/20  0026 07/25/20 0423 07/25/20 0735  BP: 130/88 131/88 105/76 116/77  Pulse:  79 76 63  Resp: _0 Temp:  98.2 F (36.8 C) 97.8 F (36.6 C) 98.2 F (36.8 C)  TempSrc:  Oral Oral   SpO2: 99% 99% 98% 97%  Weight:  79.7 kg    Height:  _1  (1.753 m)      Intake/Output Summary (Last 24 hours) at 07/25/2020 0802 Last data filed at 07/25/2020 0538 Gross per 24 hour  Intake 351.46 ml  Output 0 ml  Net 351.46 ml   Filed Weights   07/24/20 1428 07/25/20 0026  Weight: 79.8 kg 79.7 kg    Examination:  General exam: Appears calm and comfortable  Respiratory system: Clear to auscultation. Respiratory effort normal. Cardiovascular system: S1 & S2 heard, RRR. No JVD, murmurs, rubs, gallops or clicks. No pedal edema. Gastrointestinal system: Abdomen is nondistended, soft and nontender. No organomegaly or masses felt. Normal bowel sounds heard. Central nervous system: Alert and oriented. No focal neurological deficits. Extremities: Symmetric 5 x 5 power. Skin: No rashes, lesions or ulcers Psychiatry: Judgement and insight appear normal. Mood & affect appropriate.     Data Reviewed: I have personally reviewed following labs and imaging studies  CBC: Recent Labs  Lab 07/24/20 1430 07/24/20 2130 07/25/20 0332 07/25/20 0631  WBC 4.5  --  4.5 3.9*  HGB 15.6 15.5 14.2 14.5  HCT 44.8 45.0 40.6 41.5  MCV 85.7  --  86.9 87.4  PLT 240  --  201 453   Basic Metabolic Panel: Recent Labs  Lab 07/24/20 1430 07/25/20 0332  NA 136 138  K 3.9 3.9  CL 103 104  CO2 22 25  GLUCOSE 201* 219*  BUN 15 15  CREATININE 0.80 0.83  CALCIUM 9.3 8.4*   GFR: Estimated Creatinine Clearance: 101.7 mL/min (by C-G formula based on SCr of 0.83 mg/dL). Liver Function Tests: Recent Labs  Lab 07/24/20 1430 07/25/20 0332  AST 1,979* 651*  ALT 1,283* 992*  ALKPHOS 220* 223*  BILITOT 2.2* 1.8*  PROT 7.9 7.1  ALBUMIN 4.3 3.8   Recent Labs  Lab 07/24/20 1430  LIPASE 28   No results for  input(s): AMMONIA in the last 168 hours. Coagulation Profile: No results for input(s): INR, PROTIME in the last 168 hours. Cardiac Enzymes: No results for input(s): CKTOTAL, CKMB, CKMBINDEX, TROPONINI in the last 168 hours. BNP (last 3 results) No results for input(s): PROBNP in the last 8760 hours. HbA1C: No results for input(s): HGBA1C in the last 72 hours. CBG: Recent Labs  Lab 07/24/20 2232 07/25/20 0427  GLUCAP 153* 205*   Lipid Profile: No results for input(s): CHOL, HDL, LDLCALC, TRIG, CHOLHDL, LDLDIRECT in the last 72 hours. Thyroid Function Tests: No results for input(s): TSH, T4TOTAL, FREET4, T3FREE, THYROIDAB in the last 72 hours. Anemia Panel: No results for input(s): VITAMINB12, FOLATE, FERRITIN, TIBC, IRON, RETICCTPCT in the last 72 hours. Urine analysis:    Component Value Date/Time   COLORURINE AMBER (A) 07/24/2020 1812   APPEARANCEUR HAZY (A) 07/24/2020 1812  LABSPEC 1.035 (H) 07/24/2020 1812   PHURINE 5.0 07/24/2020 1812   GLUCOSEU >=500 (A) 07/24/2020 1812   HGBUR SMALL (A) 07/24/2020 1812   BILIRUBINUR NEGATIVE 07/24/2020 1812   KETONESUR NEGATIVE 07/24/2020 1812   PROTEINUR NEGATIVE 07/24/2020 1812   NITRITE NEGATIVE 07/24/2020 1812   LEUKOCYTESUR NEGATIVE 07/24/2020 1812   Sepsis Labs: _0 (procalcitonin:4,lacticidven:4)  ) Recent Results (from the past 240 hour(s))  Resp Panel by RT-PCR (Flu A&B, Covid) Nasopharyngeal Swab     Status: None   Collection Time: 07/24/20  6:12 PM   Specimen: Nasopharyngeal Swab; Nasopharyngeal(NP) swabs in vial transport medium  Result Value Ref Range Status   SARS Coronavirus 2 by RT PCR NEGATIVE NEGATIVE Final    Comment: (NOTE) SARS-CoV-2 target nucleic acids are NOT DETECTED.  The SARS-CoV-2 RNA is generally detectable in upper respiratory specimens during the acute phase of infection. The lowest concentration of SARS-CoV-2 viral copies this assay can detect is 138 copies/mL. A negative result does  not preclude SARS-Cov-2 infection and should not be used as the sole basis for treatment or other patient management decisions. A negative result may occur with  improper specimen collection/handling, submission of specimen other than nasopharyngeal swab, presence of viral mutation(s) within the areas targeted by this assay, and inadequate number of viral copies(<138 copies/mL). A negative result must be combined with clinical observations, patient history, and epidemiological information. The expected result is Negative.  Fact Sheet for Patients:  EntrepreneurPulse.com.au  Fact Sheet for Healthcare Providers:  IncredibleEmployment.be  This test is no t yet approved or cleared by the [place] FDA and  has been authorized for detection and/or diagnosis of SARS-CoV-2 by FDA under an Emergency Use Authorization (EUA). This EUA will remain  in effect (meaning this test can be used) for the duration of the COVID-19 declaration under Section 564(b)(1) of the Act, 21 U.S.C.section 360bbb-3(b)(1), unless the authorization is terminated  or revoked sooner.       Influenza A by PCR NEGATIVE NEGATIVE Final   Influenza B by PCR NEGATIVE NEGATIVE Final    Comment: (NOTE) The Xpert Xpress SARS-CoV-2/FLU/RSV plus assay is intended as an aid in the diagnosis of influenza from Nasopharyngeal swab specimens and should not be used as a sole basis for treatment. Nasal washings and aspirates are unacceptable for Xpert Xpress SARS-CoV-2/FLU/RSV testing.  Fact Sheet for Patients: EntrepreneurPulse.com.au  Fact Sheet for Healthcare Providers: IncredibleEmployment.be  This test is not yet approved or cleared by the [place] FDA and has been authorized for detection and/or diagnosis of SARS-CoV-2 by FDA under an Emergency Use Authorization (EUA). This EUA will remain in effect (meaning this test can be used) for the  duration of the COVID-19 declaration under Section 564(b)(1) of the Act, 21 U.S.C. section 360bbb-3(b)(1), unless the authorization is terminated or revoked.  Performed at [facility], [address] [place], [place] [address]          Radiology Studies: DG Chest Portable 1 View  Result Date: 07/24/2020 CLINICAL DATA:  Shortness of breath EXAM: PORTABLE CHEST 1 VIEW COMPARISON:  04/18/2009 FINDINGS: The heart size and mediastinal contours are within normal limits. Both lungs are clear. The visualized skeletal structures are unremarkable. IMPRESSION: No active disease. Electronically Signed   By: Donavan Foil M.D.   On: 07/24/2020 18:47   US Abdomen Limited RUQ (LIVER/GB)  Result Date: 07/24/2020 CLINICAL DATA:  Right upper quadrant abdominal pain EXAM: ULTRASOUND ABDOMEN LIMITED RIGHT UPPER QUADRANT COMPARISON:  None. FINDINGS: Gallbladder: The gallbladder  is filled with stones. There is no evidence for gallbladder wall thickening. The sonographic Percell Miller sign is negative. Common bile duct: Diameter: 4 mm Liver: No focal lesion identified. Within normal limits in parenchymal echogenicity. Portal vein is patent on color Doppler imaging with normal direction of blood flow towards the liver. Other: None. IMPRESSION: There is cholelithiasis without secondary signs of acute cholecystitis. Electronically Signed   By: Constance Holster M.D.   On: 07/24/2020 19:18        Scheduled Meds: . docusate sodium  100 mg Oral BID  . insulin aspart  0-15 Units Subcutaneous TID WC  . insulin glargine  10 Units Subcutaneous Daily  . pregabalin  75 mg Oral Daily  . senna  1 tablet Oral QHS  . [START ON 07/29/2020] Vitamin D (Ergocalciferol)  50,000 Units Oral Weekly   Continuous Infusions: . sodium chloride 100 mL/hr at 07/25/20 0041  . heparin 800 Units/hr (07/25/20 0058)     LOS: 1 day    Time spent: 53 min    Desma Maxim, MD [facility]   If 7PM-7AM, please  contact night-coverage [email or web address] Password [email or web address] 07/25/2020, 8:02 AM

## 2020-07-26 ENCOUNTER — Encounter: Payer: Self-pay | Admitting: Family Medicine

## 2020-07-26 DIAGNOSIS — K759 Inflammatory liver disease, unspecified: Secondary | ICD-10-CM

## 2020-07-26 DIAGNOSIS — Z87891 Personal history of nicotine dependence: Secondary | ICD-10-CM

## 2020-07-26 DIAGNOSIS — R1011 Right upper quadrant pain: Secondary | ICD-10-CM

## 2020-07-26 DIAGNOSIS — K8020 Calculus of gallbladder without cholecystitis without obstruction: Secondary | ICD-10-CM

## 2020-07-26 DIAGNOSIS — R0600 Dyspnea, unspecified: Secondary | ICD-10-CM

## 2020-07-26 LAB — COMPREHENSIVE METABOLIC PANEL
ALT: 551 U/L — ABNORMAL HIGH (ref 0–44)
AST: 137 U/L — ABNORMAL HIGH (ref 15–41)
Albumin: 3.5 g/dL (ref 3.5–5.0)
Alkaline Phosphatase: 179 U/L — ABNORMAL HIGH (ref 38–126)
Anion gap: 9 (ref 5–15)
BUN: 10 mg/dL (ref 6–20)
CO2: 23 mmol/L (ref 22–32)
Calcium: 8.1 mg/dL — ABNORMAL LOW (ref 8.9–10.3)
Chloride: 106 mmol/L (ref 98–111)
Creatinine, Ser: 0.65 mg/dL (ref 0.61–1.24)
GFR, Estimated: >60 mL/min (ref >60)
Glucose, Bld: 174 mg/dL — ABNORMAL HIGH (ref 70–99)
Potassium: 4.1 mmol/L (ref 3.5–5.1)
Sodium: 138 mmol/L (ref 135–145)
Total Bilirubin: 1 mg/dL (ref 0.3–1.2)
Total Protein: 6.3 g/dL — ABNORMAL LOW (ref 6.5–8.1)

## 2020-07-26 LAB — CBC
HCT: 40.3 % (ref 39.0–52.0)
Hemoglobin: 13.6 g/dL (ref 13.0–17.0)
MCH: 30.3 pg (ref 26.0–34.0)
MCHC: 33.7 g/dL (ref 30.0–36.0)
MCV: 89.8 fL (ref 80.0–100.0)
Platelets: 181 10*3/uL (ref 150–400)
RBC: 4.49 MIL/uL (ref 4.22–5.81)
RDW: 13.7 % (ref 11.5–15.5)
WBC: 4.3 10*3/uL (ref 4.0–10.5)
nRBC: 0 % (ref 0.0–0.2)

## 2020-07-26 LAB — HEPARIN LEVEL (UNFRACTIONATED)
Heparin Unfractionated: 0.28 IU/mL — ABNORMAL LOW (ref 0.30–0.70)
Heparin Unfractionated: 0.38 IU/mL (ref 0.30–0.70)
Heparin Unfractionated: 0.37 IU/mL (ref 0.30–0.70)

## 2020-07-26 LAB — ECHOCARDIOGRAM COMPLETE
Area-P 1/2: 4.21 cm2
Height: 69 in
S' Lateral: 2.93 cm
Weight: 2812.8 oz

## 2020-07-26 LAB — GLUCOSE, CAPILLARY
Glucose-Capillary: 191 mg/dL — ABNORMAL HIGH (ref 70–99)
Glucose-Capillary: 242 mg/dL — ABNORMAL HIGH (ref 70–99)
Glucose-Capillary: 115 mg/dL — ABNORMAL HIGH (ref 70–99)
Glucose-Capillary: 174 mg/dL — ABNORMAL HIGH (ref 70–99)

## 2020-07-26 LAB — BRAIN NATRIURETIC PEPTIDE: B Natriuretic Peptide: 26.4 pg/mL (ref 0.0–100.0)

## 2020-07-26 LAB — ANA W/REFLEX IF POSITIVE: Anti Nuclear Antibody (ANA): NEGATIVE

## 2020-07-26 LAB — ANTI-SMOOTH MUSCLE ANTIBODY, IGG: F-Actin IgG: 4 Units (ref 0–19)

## 2020-07-26 MED ORDER — INSULIN GLARGINE 100 UNIT/ML ~~LOC~~ SOLN
5.0000 [IU] | Freq: Once | SUBCUTANEOUS | Status: AC
  Administered 2020-07-26: 5 [IU] via SUBCUTANEOUS
  Filled 2020-07-26: qty 0.05

## 2020-07-26 MED ORDER — INSULIN GLARGINE 100 UNIT/ML ~~LOC~~ SOLN
15.0000 [IU] | Freq: Every day | SUBCUTANEOUS | Status: DC
  Administered 2020-07-27: 15 [IU] via SUBCUTANEOUS
  Filled 2020-07-26: qty 0.15

## 2020-07-26 NOTE — Progress Notes (Signed)
GI Inpatient Follow-up Note  Subjective:  Patient seen in follow-up for acute hepatitis, presumed ischemic hepatopathy 2/2 unknown etiology. Liver enzymes continue to trend down. No acute overnight events. No new complaints. He denies any nausea, vomiting, or abdominal pain. He had [place] sandwich for lunch. He is on IV heparin while in hospital, holding home Eliquis and Plavix. No diarrhea or rectal bleeding.   Scheduled Inpatient Medications:  . docusate sodium  100 mg Oral BID  . insulin aspart  0-15 Units Subcutaneous TID WC  . [START ON 07/27/2020] insulin glargine  15 Units Subcutaneous Daily  . pregabalin  75 mg Oral Daily  . senna  1 tablet Oral QHS  . [START ON 07/29/2020] Vitamin D (Ergocalciferol)  50,000 Units Oral Weekly    Continuous Inpatient Infusions:   . sodium chloride 100 mL/hr at 07/26/20 1532  . heparin 1,300 Units/hr (07/26/20 1641)    PRN Inpatient Medications:  labetalol, morphine injection, ondansetron **OR** ondansetron (ZOFRAN) IV  Review of Systems: Constitutional: Weight is stable.  Eyes: No changes in vision. ENT: No oral lesions, sore throat.  GI: see HPI.  Heme/Lymph: No easy bruising.  CV: No chest pain.  GU: No hematuria.  Integumentary: No rashes.  Neuro: No headaches.  Psych: No depression/anxiety.  Endocrine: No heat/cold intolerance.  Allergic/Immunologic: No urticaria.  Resp: No cough, SOB.  Musculoskeletal: No joint swelling.    Physical Examination: BP 114/78 (BP Location: Left Arm)   Pulse 73   Temp 98.3 F (36.8 C)   Resp 18   Ht 5' 9" (1.753 m)   Wt 83.3 kg   SpO2 99%   BMI 27.11 kg/m  Gen: NAD, alert and oriented x 4 HEENT: PEERLA, EOMI, Neck: supple, no JVD or thyromegaly Chest: CTA bilaterally, no wheezes, crackles, or other adventitious sounds CV: RRR, no m/g/c/r Abd: soft, NT, ND, +BS in all four quadrants; no HSM, guarding, ridigity, or rebound tenderness Ext: no edema, well perfused with 2+ pulses, Skin: no  rash or lesions noted Lymph: no LAD  Data: Lab Results  Component Value Date   WBC 4.3 07/26/2020   HGB 13.6 07/26/2020   HCT 40.3 07/26/2020   MCV 89.8 07/26/2020   PLT 181 07/26/2020   Recent Labs  Lab 07/25/20 0840 07/25/20 1500 07/26/20 0456  HGB 14.6 14.3 13.6   Lab Results  Component Value Date   NA 138 07/26/2020   K 4.1 07/26/2020   CL 106 07/26/2020   CO2 23 07/26/2020   BUN 10 07/26/2020   CREATININE 0.65 07/26/2020   Lab Results  Component Value Date   ALT 551 (H) 07/26/2020   AST 137 (H) 07/26/2020   ALKPHOS 179 (H) 07/26/2020   BILITOT 1.0 07/26/2020   Recent Labs  Lab 07/25/20 1500  APTT 37*   Assessment/Plan:  54 y/o Caucasian male with a PMH of HLD, T2DM, PVD s/p thrombectomy for right popliteal artery thrombosis admitted to [facility] for RUQ/epigastric waxing and waning abdominal pain with non-intractable nausea and vomiting found to have severely elevated LFTs  1. Acute Hepatitis - severely elevated LFTs with initial lab values significant for AST 1979, ALT 1283, alk phos 220, and tbili 2.2. LFTs improving dramatically, down to AST 137, ALT 551. Clinical presentation and labs are consistent with shock liver 2/2 general hypoperfusion. No evidence of arterial occlusion. Symptoms of nausea, vomiting, and abdominal pain have resolved. Clinically improving. Cardiology work-up negative with echo. No further inpatient cardiology w/u advised.  2. PVD on home Eliquis  and Plavix - home blood thinners on hold, currently on IV heparin  3. Rectal bleeding - likely anal outlet etiology.   4. Self-reported hematemesis - H&H stable, no recurrence  5. GERD with esophagitis - on Pantoprazole 40 mg daily  Recommendations:  - Pt is clinically improving very quickly, which suggests the underlying cause of his shock liver 2/2 hypoperfusion is being treated - Liver enzymes should return back to baseline within 7-14 days - Continue to trend LFTs. 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  - If LFTs continue to trend down and he remains asymptomatic, anticipate discharge from hospital tomorrow with outpatient GI follow-up with our clinic - If develops recurrent abdominal pain or nausea/vomiting, would advise HIDA scan. Will hold off on ordering now  Please call with questions or concerns.    Octavia Bruckner, PA-C [facility] [phone] (Cell)

## 2020-07-26 NOTE — Consult Note (Signed)
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 Naproxen Anaphylaxis  . Liraglutide Nausea Only   Patient Measurements: Height: 5\' 9"  (175.3 cm) Weight: 79.7 kg (175 lb 12.8 oz) IBW/kg (Calculated) : 70.7 Heparin Dosing Weight: 79.7 kg  Vital Signs: Temp: 97.8 F (36.6 C) (12/08 2000) Temp Source: Oral (12/08 2000) BP: 132/84 (12/08 2000) Pulse Rate: 79 (12/08 2000)  Labs: Recent Labs    07/24/20 1430 07/24/20 2126 07/24/20 2130 07/25/20 0332 07/25/20 0332 07/25/20 0631 07/25/20 0631 07/25/20 0632 07/25/20 0840 07/25/20 1500 07/25/20 2255  HGB 15.6  --    < > 14.2   < > 14.5   < >  --  14.6 14.3  --   HCT 44.8  --    < > 40.6   < > 41.5  --   --  43.0 42.0  --   PLT 240  --   --  201  --  184  --   --   --   --   --   APTT  --   --   --   --   --  33  --   --   --  37*  --   HEPARINUNFRC  --   --   --   --   --   --   --   --   --  0.16* 0.36  CREATININE 0.80  --   --  0.83  --   --   --   --   --   --   --   CKTOTAL  --   --   --   --   --   --   --  59  --   --   --   TROPONINIHS 7 9  --   --   --   --   --   --   --   --   --    < > = values in this interval not displayed.    Estimated Creatinine Clearance: 101.7 mL/min (by C-G formula based on SCr of 0.83 mg/dL).  Medical History: Past Medical History:  Diagnosis Date  . Diabetes mellitus without complication (HCC)   . Peripheral vascular disease (HCC)    Medications:  Medications Prior to Admission  Medication Sig Dispense Refill Last Dose  . clopidogrel (PLAVIX) 75 MG tablet TAKE 1 TABLET BY MOUTH EVERY DAY (Patient taking differently: Take 75 mg by mouth daily. ) 90 tablet 3 07/24/2020 at 0600  . ELIQUIS 2.5 MG TABS tablet TAKE 1 TABLET BY MOUTH TWICE A DAY (Patient taking differently: Take 2.5 mg by mouth 2 (two) times daily. ) 60 tablet 4 07/24/2020 at 0600  . FARXIGA 10 MG TABS  tablet Take 10 mg by mouth daily.   07/24/2020 at 0600  . NOVOLIN N 100 UNIT/ML injection Inject 0-30 Units into the skin 2 (two) times daily before a meal.    07/23/2020 at 2300  . pantoprazole (PROTONIX) 40 MG tablet Take 40 mg by mouth 2 (two) times daily.   07/24/2020 at 0600  . docusate sodium (COLACE) 100 MG capsule Take 1 capsule (100 mg total) by mouth 2 (two) times daily. 60 capsule 2 unknown at prn  . OZEMPIC, 0.25 OR 0.5 MG/DOSE, 2 MG/1.5ML SOPN Inject 0.5 mg into the skin once a week.   07/21/2020 at 0900   Scheduled:  .  docusate sodium  100 mg Oral BID  . insulin aspart  0-15 Units Subcutaneous TID WC  . insulin glargine  10 Units Subcutaneous Daily  . pregabalin  75 mg Oral Daily  . senna  1 tablet Oral QHS  . [START ON 07/29/2020] Vitamin D (Ergocalciferol)  50,000 Units Oral Weekly   Infusions:  . sodium chloride 100 mL/hr at 07/25/20 0041  . heparin 1,150 Units/hr (07/25/20 1614)   PRN: labetalol, morphine injection, ondansetron **OR** ondansetron (ZOFRAN) IV Anti-infectives (From admission, onward)   None     Assessment: Pharmacy consulted to start heparin. Pt is on apixaban PTA. No baseline heparin level ordered.   12/8 0655 aPTT 33  12/8 1500 aPTT 37 HL 0.16 12/8 2255 HL 0.36, therapeutic x 1  Goal of Therapy:  Heparin level 0.3-0.7 units/ml   Monitor platelets by anticoagulation protocol: Yes   Plan:  Heparin level is therapeutic. Will continue heparin infusion at 1150 units/hr.  Will order heparin level in 6 hours. CBC daily while on heparin.   Wayland Denis, PharmD 07/26/2020,12:07 AM

## 2020-07-26 NOTE — Consult Note (Signed)
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 Naproxen Anaphylaxis  . Liraglutide Nausea Only   Patient Measurements: Height: 5\' 9"  (175.3 cm) Weight: 83.3 kg (183 lb 9.6 oz) IBW/kg (Calculated) : 70.7 Heparin Dosing Weight: 79.7 kg  Vital Signs: Temp: 98.4 F (36.9 C) (12/09 1144) Temp Source: Oral (12/09 1144) BP: 117/77 (12/09 1144) Pulse Rate: 81 (12/09 1144)  Labs: Recent Labs    07/24/20 1430 07/24/20 2126 07/24/20 2130 07/25/20 0332 07/25/20 0631 07/25/20 0632 07/25/20 0840 07/25/20 0840 07/25/20 1500 07/25/20 2255 07/26/20 0456 07/26/20 0501 07/26/20 1256  HGB 15.6  --    < > 14.2 14.5  --  14.6  --  14.3  --  13.6  --   --   HCT 44.8  --    < > 40.6 41.5  --  43.0  --  42.0  --  40.3  --   --   PLT 240  --   --  201 184  --   --   --   --   --  181  --   --   APTT  --   --   --   --  33  --   --   --  37*  --   --   --   --   HEPARINUNFRC  --   --   --   --   --   --   --    < > 0.16* 0.36  --  0.28* 0.38  CREATININE 0.80  --   --  0.83  --   --   --   --   --   --   --  0.65  --   CKTOTAL  --   --   --   --   --  59  --   --   --   --   --   --   --   TROPONINIHS 7 9  --   --   --   --   --   --   --   --   --   --   --    < > = values in this interval not displayed.    Estimated Creatinine Clearance: 105.6 mL/min (by C-G formula based on SCr of 0.65 mg/dL).  Medical History: Past Medical History:  Diagnosis Date  . Diabetes mellitus without complication (HCC)   . Peripheral vascular disease (HCC)    Medications:  Medications Prior to Admission  Medication Sig Dispense Refill Last Dose  . clopidogrel (PLAVIX) 75 MG tablet TAKE 1 TABLET BY MOUTH EVERY DAY (Patient taking differently: Take 75 mg by mouth daily. ) 90 tablet 3 07/24/2020 at 0600  . ELIQUIS 2.5 MG TABS tablet TAKE 1 TABLET BY MOUTH TWICE A DAY (Patient taking  differently: Take 2.5 mg by mouth 2 (two) times daily. ) 60 tablet 4 07/24/2020 at 0600  . FARXIGA 10 MG TABS tablet Take 10 mg by mouth daily.   07/24/2020 at 0600  . NOVOLIN N 100 UNIT/ML injection Inject 0-30 Units into the skin 2 (two) times daily before a meal.    07/23/2020 at 2300  . pantoprazole (PROTONIX) 40 MG tablet Take 40 mg by mouth 2 (two) times daily.   07/24/2020 at 0600  . docusate sodium (COLACE) 100 MG capsule Take 1 capsule (100 mg total) by  mouth 2 (two) times daily. 60 capsule 2 unknown at prn  . OZEMPIC, 0.25 OR 0.5 MG/DOSE, 2 MG/1.5ML SOPN Inject 0.5 mg into the skin once a week.   07/21/2020 at 0900   Scheduled:  . docusate sodium  100 mg Oral BID  . insulin aspart  0-15 Units Subcutaneous TID WC  . [START ON 07/27/2020] insulin glargine  15 Units Subcutaneous Daily  . pregabalin  75 mg Oral Daily  . senna  1 tablet Oral QHS  . [START ON 07/29/2020] Vitamin D (Ergocalciferol)  50,000 Units Oral Weekly   Infusions:  . sodium chloride 100 mL/hr at 07/26/20 1222  . heparin 1,300 Units/hr (07/26/20 0725)   PRN: labetalol, morphine injection, ondansetron **OR** ondansetron (ZOFRAN) IV Anti-infectives (From admission, onward)   None     Assessment: Pharmacy consulted to start heparin. Pt is on apixaban PTA. No baseline heparin level ordered.   12/8 0655 aPTT 33  12/8 1500 aPTT 37 HL 0.16 12/8 2255 HL 0.36, therapeutic x 1 12/9 0501 HL 0.28, SUBtherapeutic 12/9 1256 HL 0.38   Goal of Therapy:  Heparin level 0.3-0.7 units/ml   Monitor platelets by anticoagulation protocol: Yes   Plan:  Heparin level is therapeutic. Will continue heparin infusion at 1300 units/hr.  Will order heparin level in 6 hours. CBC daily while on heparin.   Ronnald Ramp, PharmD 07/26/2020,1:27 PM

## 2020-07-26 NOTE — Consult Note (Signed)
Cardiology Consult    Patient ID: Justin Adkins MRN: [id number], DOB/AGE: 12-09-1965   Admit date: 07/24/2020 Date of Consult: 07/26/2020  Primary Physician: [facility] Primary Cardiologist: Julien Nordmann, MD Requesting Provider: Alfonso Patten, MD  Patient Profile    CEDERICK Adkins is a 54 y.o. male with a history of PAD, DMII, HL, and remote tob abuse, who is being seen today for the evaluation of dyspnea at the request of Dr. Ashok Pall.  Past Medical History   Past Medical History:  Diagnosis Date  . Diabetes mellitus without complication (HCC)   . History of echocardiogram    a. 07/2020 Echo: EF 60-65%, no rwma, mild LVH. Nl RV fxn. Mildly dil LA.  Marland Kitchen History of tobacco abuse   . Hyperlipidemia   . Peripheral vascular disease (HCC)    a. 05/2019 s/p PTA R popliteal/AT, mech thrombectomy to R popliteal/tibioperoneal trunk--> plavix & eliquis 2.5 bid since; 09/2019 LE u/s: near nl study.    Past Surgical History:  Procedure Laterality Date  . ESOPHAGOGASTRODUODENOSCOPY (EGD) WITH PROPOFOL N/A 02/28/2020   Procedure: ESOPHAGOGASTRODUODENOSCOPY (EGD) WITH PROPOFOL;  Surgeon: Regis Bill, MD;  Location: [facility] ENDOSCOPY;  Service: Endoscopy;  Laterality: N/A;  . HERNIA REPAIR    . left foot surgery     neuroma  . LOWER EXTREMITY ANGIOGRAPHY Right 05/25/2019   Procedure: LOWER EXTREMITY ANGIOGRAPHY;  Surgeon: Renford Dills, MD;  Location: [facility] INVASIVE CV LAB;  Service: Cardiovascular;  Laterality: Right;  . TONSILLECTOMY       Allergies  Allergies  Allergen Reactions  . Naproxen Anaphylaxis  . Liraglutide Nausea Only    History of Present Illness    54 year old male with a history of peripheral arterial disease, type 2 diabetes mellitus, hyperlipidemia, and remote tobacco abuse.  He has no prior cardiac history but does have a family history of premature CAD with a brother experiencing a myocardial infarction at age 10.  In October 2020, he was evaluated  by vascular surgery secondary to painful right lower extremity.  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  Cholelithiasis without acute formation was also noted.  In the setting of vascular findings, patient was admitted underwent lower extremity angiography and percutaneous transluminal angioplasty to the right popliteal and anterior tibial with mechanical thrombectomy to the right popliteal and tibial peroneal trunk.  He has since been on Plavix and low-dose Eliquis therapy.  Follow-up lower extremity duplex in February 2021 was essentially normal.  He has since followed with vascular surgery and quit smoking last year.  Since about March of this year, shortly after receiving his [employer] COVID-19 vaccination, he has been dealing with indigestion and intermittent constipation.  He was actually seen in the emergency department at one point due to constipation and has since been taking stool softeners a few times per week.  He notes that ever since that ER visit back in the spring, that he has had generally loose stools.  He was also having occasional bright red blood per rectum, usually noted in his toilet or on toilet paper.  He was told that brbpr was 2/2 hemorrhoids.  In July, in the setting of ongoing indigestion, he was evaluated by GI and underwent EGD which showed LA grade A esophagitis without bleeding, small hiatal hernia, and erythema of the gastric antrum.  He has been taking PPI therapy since but  has continued to have intermittent indigestion and epigastric pain.  Over the past several months, he is also noticed dyspnea on exertion that can occur after walking about 30 to 40 yards and up a few steps, on his way into work.  He does not experience exertional chest discomfort.  Earlier this week, on December 7, he was driving to work and had more severe upper  abdomen/epigastric discomfort causing him to pull over into a parking lot.  Unfortunately, symptoms persisted and he drove himself to the [facility] emergency department.  Here, he was noted to have abnormal LFTs with an AST of 1979, ALT 1283, alkaline phosphatase of 220, total bilirubin of 2.2.  CBC was normal.  BMP and troponins were also normal.  Abdominal ultrasound was performed and showed cholelithiasis without secondary or acute cholecystitis.  He was admitted to the medicine service.  LFTs have been trending down since admission with an alkaline phosphatase of 179, total bilirubin of 1.0, AST 137, ALT of 551 this morning.  He was seen by GI on December 8 and it was felt the pattern of liver enzyme elevation may suggest ischemic hepatopathy.  There is also concern for possible cardiac ischemia or CHF however, echocardiogram was performed and showed normal LV function without any significant valvular disease.  Due to GIs concerns, we have been consulted.  Currently, patient is resting comfortably and notes abdominal discomfort has improved significantly.  Inpatient Medications    . docusate sodium  100 mg Oral BID  . insulin aspart  0-15 Units Subcutaneous TID WC  . [START ON 07/27/2020] insulin glargine  15 Units Subcutaneous Daily  . pregabalin  75 mg Oral Daily  . senna  1 tablet Oral QHS  . [START ON 07/29/2020] Vitamin D (Ergocalciferol)  50,000 Units Oral Weekly    Family History    Family History  Problem Relation Age of Onset  . Cancer Father   . Heart attack Brother        MI @ 4338   He indicated that his mother is deceased. He indicated that his father is deceased. He indicated that the status of his brother is unknown.   Social History    Social History   Socioeconomic History  . Marital status: Married    Spouse name: Not on file  . Number of children: Not on file  . Years of education: Not on file  . Highest education level: Not on file  Occupational History  . Not  on file  Tobacco Use  . Smoking status: Former Smoker    Packs/day: 1.00    Years: 35.00    Pack years: 35.00  . Smokeless tobacco: Never Used  Vaping Use  . Vaping Use: Never used  Substance and Sexual Activity  . Alcohol use: Not Currently  . Drug use: No  . Sexual activity: Yes    Birth control/protection: None  Other Topics Concern  . Not on file  Social History Narrative   Lives in [place] w/ wife and son.  Does not routinely exercise.   Social Determinants of Health   Financial Resource Strain: Not on file  Food Insecurity: Not on file  Transportation Needs: Not on file  Physical Activity: Not on file  Stress: Not on file  Social Connections: Not on file  Intimate Partner Violence: Not on file     Review of Systems    General:  No chills, fever, night sweats or weight changes.  Cardiovascular:  No chest pain, +++  dyspnea on exertion,no edema, orthopnea, palpitations, paroxysmal nocturnal dyspnea. Dermatological: No rash, lesions/masses Respiratory: No cough, +++ dyspnea Urologic: No hematuria, dysuria Abdominal:   +++ nausea, +++ vomiting, +++ soft/mucoid stools, no diarrhea, bright red blood per rectum, melena, or hematemesis Neurologic:  No visual changes, wkns, changes in mental status. All other systems reviewed and are otherwise negative except as noted above.  Physical Exam    Blood pressure 117/77, pulse 81, temperature 98.4 F (36.9 C), temperature source Oral, resp. rate 18, height 5\' 9"  (1.753 m), weight 83.3 kg, SpO2 98 %.  General: Pleasant, NAD Psych: Normal affect. Neuro: Alert and oriented X 3. Moves all extremities spontaneously. HEENT: Normal  Neck: Supple without bruits or JVD. Lungs:  Resp regular and unlabored, CTA. Heart: RRR no s3, s4, or murmurs. Abdomen: Soft, non-tender, non-distended, BS + x 4.  Extremities: No clubbing, cyanosis or edema. DP/PT2+, Radials 2+ and equal bilaterally.  Labs    Cardiac Enzymes Recent Labs  Lab  07/24/20 1430 07/24/20 2126  TROPONINIHS 7 9      Lab Results  Component Value Date   WBC 4.3 07/26/2020   HGB 13.6 07/26/2020   HCT 40.3 07/26/2020   MCV 89.8 07/26/2020   PLT 181 07/26/2020    Recent Labs  Lab 07/26/20 0501  NA 138  K 4.1  CL 106  CO2 23  BUN 10  CREATININE 0.65  CALCIUM 8.1*  PROT 6.3*  BILITOT 1.0  ALKPHOS 179*  ALT 551*  AST 137*  GLUCOSE 174*   Radiology Studies    DG Chest Portable 1 View  Result Date: 07/24/2020 CLINICAL DATA:  Shortness of breath EXAM: PORTABLE CHEST 1 VIEW COMPARISON:  04/18/2009 FINDINGS: The heart size and mediastinal contours are within normal limits. Both lungs are clear. The visualized skeletal structures are unremarkable. IMPRESSION: No active disease. Electronically Signed   By: 06/18/2009 M.D.   On: 07/24/2020 18:47   14/02/2020 Abdomen Limited RUQ (LIVER/GB)  Result Date: 07/24/2020 CLINICAL DATA:  Right upper quadrant abdominal pain EXAM: ULTRASOUND ABDOMEN LIMITED RIGHT UPPER QUADRANT COMPARISON:  None. FINDINGS: Gallbladder: The gallbladder is filled with stones. There is no evidence for gallbladder wall thickening. The sonographic 14/02/2020 sign is negative. Common bile duct: Diameter: 4 mm Liver: No focal lesion identified. Within normal limits in parenchymal echogenicity. Portal vein is patent on color Doppler imaging with normal direction of blood flow towards the liver. Other: None. IMPRESSION: There is cholelithiasis without secondary signs of acute cholecystitis. Electronically Signed   By: Eulah Pont M.D.   On: 07/24/2020 19:18    ECG & Cardiac Imaging    RSR, 85, no acute ST/T changes - personally reviewed.  Assessment & Plan    1.  Acute hepatitis/transaminitis: Patient admitted December 7 with progressive epigastric pain, nausea, vomiting in the setting of several month history of indigestion with relatively unremarkable EGD in July.  On admission, LFTs markedly abnormal with a total bilirubin of 2.2,  alkaline phosphatase of 220, AST of 1979, and ALT of 1283.  Numbers have since trended downward.  Abdominal ultrasound in the ED showed cholelithiasis without cholecystitis.  He was seen by GI yesterday with concern for ischemic hepatopathy with questionable role of cardiac ischemia versus CHF.  Notably, troponin, ECG, and BNP were all normal.  Echocardiogram performed yesterday also showed normal LV function without any significant valvular disease.  CT angio of the abdomen last year did not show any significant disease in the abdominal vessels.  Defer  further work-up/imaging related to LFT abnormalities to primary team.  GI has recommended HIDA scan.  2.  Dyspnea on exertion: Patient notes that over the past several months, he has been experiencing dyspnea exertion.  He does not experience exertional chest pain.  He has had "indigestion."  As above, no objective evidence of ischemia on evaluation thus far and echo shows normal LV function.  With prior history of smoking, peripheral arterial disease, and family history of premature CAD with his brother suffered an MI at age 30, risk stratification with stress testing versus coronary CT angiography is appropriate and can be performed on an outpatient basis once he recovers from current illness.  3.  Peripheral arterial disease: Status post right lower extremity angioplasty and thrombectomy in October 2020.  He has been on low-dose Eliquis and Plavix since then and has not had any claudication.  Given concern for ischemic hepatopathy and history of embolic disease, consider repeat CT angiography of the abdomen.  4.  Type 2 diabetes mellitus: On Farxiga and Ozempic at home.  Per internal medicine.  Signed, Nicolasa Ducking, NP 07/26/2020, 1:45 PM  For questions or updates, please contact   Please consult [email or web address] for contact info under Cardiology/STEMI.

## 2020-07-26 NOTE — Progress Notes (Addendum)
PROGRESS NOTE    Justin Adkins  JJO:[id number] DOB: [birth year] DOA: 07/24/2020 PCP: [facility]  Outpatient Specialists: Justin [facility], endocrinology    Brief Narrative:   Justin Adkins  is a 54 y.o. Caucasian male with a known history of type 2 diabetes mellitus, peripheral vascular disease, dyslipidemia and peripheral vascular disease, who presented to the emergency room with acute onset of epigastric and right upper quadrant abdominal pain that started Sunday evening with nausea on Saturday morning and vomiting starting on Saturday evening.  He stated that last night around 3 AM he woke up with vomiting of brownish fluid without bright red blood.  He has been having chronic mucousy bloody bowel movements with occasional bright red blood without melena.  He has hemorrhoids.  He stated that he had a colonoscopy in August 2020 at [facility] that they revealed only internal hemorrhoids with recommendation for repeat colonoscopy in 10 years.  He later had an EGD in July of this year that revealed esophagitis.  No chest pain or dyspnea or cough or wheezing.  No dysuria, oliguria or hematuria or flank pain.  No bleeding diathesis.  He works as a Dealer.  No recent toxic exposure.  He is a social alcohol drinker.  He has been taking Lipitor for his dyslipidemia.  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  Upon presentation to the emergency room, vital signs were within normal and later blood pressure was 160/98 and later 179/113 and heart rate was up to 29.  Labs revealed hyperglycemia of 201 and alk phos 220, AST 1979 and ALT 1283 with total bili of 2.2.  CBC-was within normal.  Urinalysis showed more than 500 glucose and was otherwise unremarkable. Chest x-ray showed no acute cardiopulmonary disease.  Right upper quadrant ultrasound revealed cholelithiasis without secondary signs of acute  cholecystitis. EKG showed no sinus rhythm with rate of 85.  Justin Adkins was contacted about the patient and recommended IV heparin if Eliquis and Plavix will be held off.  The patient will be admitted to a medical bed for further evaluation and management.   Assessment & Plan:   Active Problems:   Acute hepatitis  Acute hepatitis.   Etiology is unclear. Denies alcohol/drugs. Viral hepatitis panel neg. Tylenol level neg. liver u/s neg including no signs portal vein occlusion. Is on several medications that could cause acute hepatitis. Justin Adkins this far shows possible iron overload. Symptoms essentially resolved, LFTs continue to down-trend. GI concerned about possible ischemic hepatitis, TTE ordered showing no significant cardiac dysfunction, had normal CTA of abdomen 1 year ago. Given pt report of pain and chronic bloody soft bms (previously worked up w/ colonoscopy and egd thought to be 2/2 internal hemorrhoids), may be reasonable to repeat CTA to eval for ischemia. - holding home meds that can cause LFT elevation (lipitor, metformin, eliquis, plavix, semaglutide, pantoprazole)  - f/u hemochromatosis dna pcr, ana, anti-sm  - continue to monitor LFTS - f/u GI and cardiology recs. - will discuss w/ GI whether further w/u for hepatic infarct is indicated  Hypertensive urgency. Resolved  Peripheral vascular disease. -continue IV heparin to replace aspirin and Plavix for now.  Type 2 diabetes mellitus. Glucse upper 100s this morning - hold home semaglutide, novolin, ozempic - increase lantus to 15 qd, SSI moderate  Dyslipidemia. -Statin therapy will be held off and LFTs will be followed.   DVT prophylaxis: heparin Code Status: full  Family Communication: none at bedside  Status is: Inpatient  Remains inpatient appropriate because:Inpatient level of care appropriate due to severity of illness   Dispo: The patient is from: Home              Anticipated d/c is to: Home               Anticipated d/c date is: 1-2 days              Patient currently is not medically stable to d/c.        Consultants:  GI Cardiology  Procedures: none  Antimicrobials:  none    Subjective: Unchanged from yesterday, mild epigastric discomfort  Objective: Vitals:   07/26/20 0428 07/26/20 0646 07/26/20 0743 07/26/20 1144  BP: 134/82  122/79 117/77  Pulse: 72  68 81  Resp: '19  18 18  ' Temp: (!) 97.3 F (36.3 C)  98.1 F (36.7 C) 98.4 F (36.9 C)  TempSrc: Oral  Oral   SpO2: 98%  99% 98%  Weight:  83.3 kg    Height:        Intake/Output Summary (Last 24 hours) at 07/26/2020 1151 Last data filed at 07/26/2020 0646 Gross per 24 hour  Intake 240 ml  Output 500 ml  Net -260 ml   Filed Weights   07/24/20 1428 07/25/20 0026 07/26/20 0646  Weight: 79.8 kg 79.7 kg 83.3 kg    Examination:  General exam: Appears calm and comfortable  Respiratory system: Clear to auscultation. Respiratory effort normal. Cardiovascular system: S1 & S2 heard, RRR. No JVD, murmurs, rubs, gallops or clicks. No pedal edema. Gastrointestinal system: Abdomen is nondistended, soft and nontender. No organomegaly or masses felt. Normal bowel sounds heard. Central nervous system: Alert and oriented. No focal neurological deficits. Extremities: Symmetric 5 x 5 power. Skin: No rashes, lesions or ulcers Psychiatry: Judgement and insight appear normal. Mood & affect appropriate.     Data Reviewed: I have personally reviewed following labs and imaging studies  CBC: Recent Labs  Lab 07/24/20 1430 07/24/20 2130 07/25/20 0332 07/25/20 0631 07/25/20 0840 07/25/20 1500 07/26/20 0456  WBC 4.5  --  4.5 3.9*  --   --  4.3  HGB 15.6   < > 14.2 14.5 14.6 14.3 13.6  HCT 44.8   < > 40.6 41.5 43.0 42.0 40.3  MCV 85.7  --  86.9 87.4  --   --  89.8  PLT 240  --  201 184  --   --  181   < > = values in this interval not displayed.   Basic Metabolic Panel: Recent Labs  Lab 07/24/20 1430  07/25/20 0332 07/26/20 0501  NA 136 138 138  K 3.9 3.9 4.1  CL 103 104 106  CO2 '22 25 23  ' GLUCOSE 201* 219* 174*  BUN '15 15 10  ' CREATININE 0.80 0.83 0.65  CALCIUM 9.3 8.4* 8.1*   GFR: Estimated Creatinine Clearance: 105.6 mL/min (by C-G formula based on SCr of 0.65 mg/dL). Liver Function Tests: Recent Labs  Lab 07/24/20 1430 07/25/20 0332 07/26/20 0501  AST 1,979* 651* 137*  ALT 1,283* 992* 551*  ALKPHOS 220* 223* 179*  BILITOT 2.2* 1.8* 1.0  PROT 7.9 7.1 6.3*  ALBUMIN 4.3 3.8 3.5   Recent Labs  Lab 07/24/20 1430  LIPASE 28   No results for input(s): AMMONIA in the last 168 hours. Coagulation Profile: No results for input(s): INR, PROTIME in the last 168 hours. Cardiac Enzymes: Recent Labs  Lab 07/25/20 0632  CKTOTAL 59   BNP (last 3 results) No results for input(s): PROBNP in the last 8760 hours. HbA1C: Recent Labs    07/25/20 0632  HGBA1C 6.9*   CBG: Recent Labs  Lab 07/25/20 0812 07/25/20 1151 07/25/20 1619 07/25/20 2002 07/26/20 0743  GLUCAP 164* 244* 154* 241* 191*   Lipid Profile: No results for input(s): CHOL, HDL, LDLCALC, TRIG, CHOLHDL, LDLDIRECT in the last 72 hours. Thyroid Function Tests: Recent Labs    07/25/20 0632  TSH 0.760   Anemia Panel: Recent Labs    07/25/20 0632  FERRITIN 1,334*  TIBC 337  IRON 174   Urine analysis:    Component Value Date/Time   COLORURINE AMBER (A) 07/24/2020 1812   APPEARANCEUR HAZY (A) 07/24/2020 1812   LABSPEC 1.035 (H) 07/24/2020 1812   PHURINE 5.0 07/24/2020 1812   GLUCOSEU >=500 (A) 07/24/2020 1812   HGBUR SMALL (A) 07/24/2020 1812   BILIRUBINUR NEGATIVE 07/24/2020 1812   KETONESUR NEGATIVE 07/24/2020 1812   PROTEINUR NEGATIVE 07/24/2020 1812   NITRITE NEGATIVE 07/24/2020 1812   LEUKOCYTESUR NEGATIVE 07/24/2020 1812   Sepsis Labs: '@LABRCNTIP' (procalcitonin:4,lacticidven:4)  ) Recent Results (from the past 240 hour(s))  Resp Panel by RT-PCR (Flu A&B, Covid) Nasopharyngeal Swab      Status: None   Collection Time: 07/24/20  6:12 PM   Specimen: Nasopharyngeal Swab; Nasopharyngeal(NP) swabs in vial transport medium  Result Value Ref Range Status   SARS Coronavirus 2 by RT PCR NEGATIVE NEGATIVE Final    Comment: (NOTE) SARS-CoV-2 target nucleic acids are NOT DETECTED.  The SARS-CoV-2 RNA is generally detectable in upper respiratory specimens during the acute phase of infection. The lowest concentration of SARS-CoV-2 viral copies this assay can detect is 138 copies/mL. A negative result does not preclude SARS-Cov-2 infection and should not be used as the sole basis for treatment or other patient management decisions. A negative result may occur with  improper specimen collection/handling, submission of specimen other than nasopharyngeal swab, presence of viral mutation(s) within the areas targeted by this assay, and inadequate number of viral copies(<138 copies/mL). A negative result must be combined with clinical observations, patient history, and epidemiological information. The expected result is Negative.  Fact Sheet for Patients:  EntrepreneurPulse.com.au  Fact Sheet for Healthcare Providers:  IncredibleEmployment.be  This test is no t yet approved or cleared by the [place] FDA and  has been authorized for detection and/or diagnosis of SARS-CoV-2 by FDA under an Emergency Use Authorization (EUA). This EUA will remain  in effect (meaning this test can be used) for the duration of the COVID-19 declaration under Section 564(b)(1) of the Act, 21 U.S.C.section 360bbb-3(b)(1), unless the authorization is terminated  or revoked sooner.       Influenza A by PCR NEGATIVE NEGATIVE Final   Influenza B by PCR NEGATIVE NEGATIVE Final    Comment: (NOTE) The Xpert Xpress SARS-CoV-2/FLU/RSV plus assay is intended as an aid in the diagnosis of influenza from Nasopharyngeal swab specimens and should not be used as a sole basis  for treatment. Nasal washings and aspirates are unacceptable for Xpert Xpress SARS-CoV-2/FLU/RSV testing.  Fact Sheet for Patients: EntrepreneurPulse.com.au  Fact Sheet for Healthcare Providers: IncredibleEmployment.be  This test is not yet approved or cleared by the [place] FDA and has been authorized for detection and/or diagnosis of SARS-CoV-2 by FDA under an Emergency Use Authorization (EUA). This EUA will remain in effect (meaning this test can be used) for the duration of the COVID-19 declaration under  Section 564(b)(1) of the Act, 21 U.S.C. section 360bbb-3(b)(1), unless the authorization is terminated or revoked.  Performed at [facility], [address], [place], [place] [address]          Radiology Studies: DG Chest Portable 1 View  Result Date: 07/24/2020 CLINICAL DATA:  Shortness of breath EXAM: PORTABLE CHEST 1 VIEW COMPARISON:  04/18/2009 FINDINGS: The heart size and mediastinal contours are within normal limits. Both lungs are clear. The visualized skeletal structures are unremarkable. IMPRESSION: No active disease. Electronically Signed   By: Donavan Foil M.D.   On: 07/24/2020 18:47   ECHOCARDIOGRAM COMPLETE  Result Date: 07/26/2020    ECHOCARDIOGRAM REPORT   Patient Name:   Justin Adkins Date of Exam: 07/25/2020 Medical Rec #:  [id number]      Height:       69.0 in Accession #:    [id number]     Weight:       175.8 lb Date of Birth:  12/04/1965      BSA:          1.956 m Patient Age:    38 years       BP:           128/73 mmHg Patient Gender: M              HR:           70 bpm. Exam Location:  [facility] Procedure: 2D Echo, Cardiac Doppler and Color Doppler Indications:     Epigastric pain  History:         Patient has no prior history of Echocardiogram examinations.                  Risk Factors:Diabetes. Peripheral vascular disease.  Sonographer:     Wilford Sports Rodgers-Jones Referring Phys:  Glenwood Fritzie.Siad  Diagnosing Phys: Bartholome Bill MD IMPRESSIONS  1. Left ventricular ejection fraction, by estimation, is 60 to 65%. The left ventricle has normal function. The left ventricle has no regional wall motion abnormalities. There is mild left ventricular hypertrophy. Left ventricular diastolic parameters were normal.  2. Right ventricular systolic function is normal. The right ventricular size is normal.  3. Left atrial size was mildly dilated.  4. The mitral valve is grossly normal. No evidence of mitral valve regurgitation.  5. The aortic valve is grossly normal. Aortic valve regurgitation is not visualized.  6. Aortic dilatation noted. FINDINGS  Left Ventricle: Left ventricular ejection fraction, by estimation, is 60 to 65%. The left ventricle has normal function. The left ventricle has no regional wall motion abnormalities. The left ventricular internal cavity size was normal in size. There is  mild left ventricular hypertrophy. Left ventricular diastolic parameters were normal. Right Ventricle: The right ventricular size is normal. No increase in right ventricular wall thickness. Right ventricular systolic function is normal. Left Atrium: Left atrial size was mildly dilated. Right Atrium: Right atrial size was normal in size. Pericardium: There is no evidence of pericardial effusion. Mitral Valve: The mitral valve is grossly normal. No evidence of mitral valve regurgitation. Tricuspid Valve: The tricuspid valve is grossly normal. Tricuspid valve regurgitation is mild. Aortic Valve: The aortic valve is grossly normal. Aortic valve regurgitation is not visualized. Pulmonic Valve: The pulmonic valve was not well visualized. Pulmonic valve regurgitation is mild. Aorta: Aortic dilatation noted. IAS/Shunts: The atrial septum is grossly normal.  LEFT VENTRICLE PLAX 2D LVIDd:         4.56 cm  Diastology  LVIDs:         2.93 cm  LV e' medial:    8.49 cm/s LV PW:         1.07 cm  LV E/e' medial:  8.2 LV IVS:        0.91 cm  LV e'  lateral:   11.20 cm/s LVOT diam:     2.20 cm  LV E/e' lateral: 6.2 LV SV:         76 LV SV Index:   39 LVOT Area:     3.80 cm  RIGHT VENTRICLE             IVC RV Basal diam:  3.59 cm     IVC diam: 1.91 cm RV S prime:     25.90 cm/s TAPSE (M-mode): 2.5 cm LEFT ATRIUM             Index       RIGHT ATRIUM           Index LA diam:        4.20 cm 2.15 cm/m  RA Area:     14.00 cm LA Vol (A2C):   35.8 ml 18.30 ml/m RA Volume:   39.30 ml  20.09 ml/m LA Vol (A4C):   25.3 ml 12.94 ml/m LA Biplane Vol: 30.9 ml 15.80 ml/m  AORTIC VALVE LVOT Vmax:   107.00 cm/s LVOT Vmean:  72.100 cm/s LVOT VTI:    0.199 m  AORTA Ao Root diam: 3.50 cm Ao Asc diam:  3.60 cm MITRAL VALVE MV Area (PHT): 4.21 cm    SHUNTS MV Decel Time: 180 msec    Systemic VTI:  0.20 m MV E velocity: 69.40 cm/s  Systemic Diam: 2.20 cm MV A velocity: 60.40 cm/s MV E/A ratio:  1.15 Bartholome Bill MD Electronically signed by Bartholome Bill MD Signature Date/Time: 07/26/2020/7:40:17 AM    Final    US Abdomen Limited RUQ (LIVER/GB)  Result Date: 07/24/2020 CLINICAL DATA:  Right upper quadrant abdominal pain EXAM: ULTRASOUND ABDOMEN LIMITED RIGHT UPPER QUADRANT COMPARISON:  None. FINDINGS: Gallbladder: The gallbladder is filled with stones. There is no evidence for gallbladder wall thickening. The sonographic Percell Miller sign is negative. Common bile duct: Diameter: 4 mm Liver: No focal lesion identified. Within normal limits in parenchymal echogenicity. Portal vein is patent on color Doppler imaging with normal direction of blood flow towards the liver. Other: None. IMPRESSION: There is cholelithiasis without secondary signs of acute cholecystitis. Electronically Signed   By: Constance Holster M.D.   On: 07/24/2020 19:18        Scheduled Meds: . docusate sodium  100 mg Oral BID  . insulin aspart  0-15 Units Subcutaneous TID WC  . insulin glargine  10 Units Subcutaneous Daily  . pregabalin  75 mg Oral Daily  . senna  1 tablet Oral QHS  . [START ON  07/29/2020] Vitamin D (Ergocalciferol)  50,000 Units Oral Weekly   Continuous Infusions: . sodium chloride 100 mL/hr at 07/26/20 0414  . heparin 1,300 Units/hr (07/26/20 0725)     LOS: 2 days    Time spent: 68 min    Desma Maxim, MD [facility]   If 7PM-7AM, please contact night-coverage [email or web address] Password [email or web address] 07/26/2020, 11:51 AM

## 2020-07-26 NOTE — Consult Note (Signed)
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 Naproxen Anaphylaxis  . Liraglutide Nausea Only   Patient Measurements: Height: 5\' 9"  (175.3 cm) Weight: 83.3 kg (183 lb 9.6 oz) IBW/kg (Calculated) : 70.7 Heparin Dosing Weight: 79.7 kg  Vital Signs: Temp: 98.2 F (36.8 C) (12/09 1931) Temp Source: Oral (12/09 1931) BP: 128/88 (12/09 1931) Pulse Rate: 76 (12/09 1931)  Labs: Recent Labs    07/24/20 1430 07/24/20 2126 07/24/20 2130 07/25/20 0332 07/25/20 0631 07/25/20 0632 07/25/20 0840 07/25/20 1500 07/25/20 2255 07/26/20 0456 07/26/20 0501 07/26/20 1256 07/26/20 1856  HGB 15.6  --    < > 14.2 14.5  --  14.6 14.3  --  13.6  --   --   --   HCT 44.8  --    < > 40.6 41.5  --  43.0 42.0  --  40.3  --   --   --   PLT 240  --   --  201 184  --   --   --   --  181  --   --   --   APTT  --   --   --   --  33  --   --  37*  --   --   --   --   --   HEPARINUNFRC  --   --   --   --   --   --   --  0.16*   < >  --  0.28* 0.38 0.37  CREATININE 0.80  --   --  0.83  --   --   --   --   --   --  0.65  --   --   CKTOTAL  --   --   --   --   --  59  --   --   --   --   --   --   --   TROPONINIHS 7 9  --   --   --   --   --   --   --   --   --   --   --    < > = values in this interval not displayed.    Estimated Creatinine Clearance: 105.6 mL/min (by C-G formula based on SCr of 0.65 mg/dL).  Medical History: Past Medical History:  Diagnosis Date  . Diabetes mellitus without complication (HCC)   . History of echocardiogram    a. 07/2020 Echo: EF 60-65%, no rwma, mild LVH. Nl RV fxn. Mildly dil LA.  08/2020 History of tobacco abuse   . Hyperlipidemia   . Peripheral vascular disease (HCC)    a. 05/2019 s/p PTA R popliteal/AT, mech thrombectomy to R popliteal/tibioperoneal trunk--> plavix & eliquis 2.5 bid since; 09/2019 LE u/s: near nl study.   Medications:   Medications Prior to Admission  Medication Sig Dispense Refill Last Dose  . clopidogrel (PLAVIX) 75 MG tablet TAKE 1 TABLET BY MOUTH EVERY DAY (Patient taking differently: Take 75 mg by mouth daily. ) 90 tablet 3 07/24/2020 at 0600  . ELIQUIS 2.5 MG TABS tablet TAKE 1 TABLET BY MOUTH TWICE A DAY (Patient taking differently: Take 2.5 mg by mouth 2 (two) times daily. ) 60 tablet 4 07/24/2020 at 0600  . FARXIGA 10 MG TABS tablet Take 10 mg by mouth daily.   07/24/2020 at 0600  .  NOVOLIN N 100 UNIT/ML injection Inject 0-30 Units into the skin 2 (two) times daily before a meal.    07/23/2020 at 2300  . pantoprazole (PROTONIX) 40 MG tablet Take 40 mg by mouth 2 (two) times daily.   07/24/2020 at 0600  . docusate sodium (COLACE) 100 MG capsule Take 1 capsule (100 mg total) by mouth 2 (two) times daily. 60 capsule 2 unknown at prn  . OZEMPIC, 0.25 OR 0.5 MG/DOSE, 2 MG/1.5ML SOPN Inject 0.5 mg into the skin once a week.   07/21/2020 at 0900   Scheduled:  . docusate sodium  100 mg Oral BID  . insulin aspart  0-15 Units Subcutaneous TID WC  . [START ON 07/27/2020] insulin glargine  15 Units Subcutaneous Daily  . pregabalin  75 mg Oral Daily  . senna  1 tablet Oral QHS  . [START ON 07/29/2020] Vitamin D (Ergocalciferol)  50,000 Units Oral Weekly   Infusions:  . sodium chloride 100 mL/hr at 07/26/20 1532  . heparin 1,300 Units/hr (07/26/20 1641)   PRN: labetalol, morphine injection, ondansetron **OR** ondansetron (ZOFRAN) IV Anti-infectives (From admission, onward)   None     Assessment: Pharmacy consulted to start heparin. Pt is on apixaban PTA. No baseline heparin level ordered.   12/8 0655 aPTT 33  12/8 1500 aPTT 37 HL 0.16 12/8 2255 HL 0.36, therapeutic x 1 12/9 0501 HL 0.28, SUBtherapeutic 12/9 1256 HL 0.38, therapeutic x 1  12/9 1856 HL 0.37, therapeutic x 2  Goal of Therapy:  Heparin level 0.3-0.7 units/ml   Monitor platelets by anticoagulation protocol: Yes   Plan:  Heparin level is  therapeutic. Will continue heparin infusion at 1300 units/hr.  Will order heparin level with AM labs. CBC daily while on heparin.   Clovia Cuff, PharmD, BCPS 07/26/2020 8:05 PM

## 2020-07-26 NOTE — Consult Note (Signed)
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 Naproxen Anaphylaxis  . Liraglutide Nausea Only   Patient Measurements: Height: 5\' 9"  (175.3 cm) Weight: 83.3 kg (183 lb 9.6 oz) IBW/kg (Calculated) : 70.7 Heparin Dosing Weight: 79.7 kg  Vital Signs: Temp: 97.3 F (36.3 C) (12/09 0428) Temp Source: Oral (12/09 0428) BP: 134/82 (12/09 0428) Pulse Rate: 72 (12/09 0428)  Labs: Recent Labs    07/24/20 1430 07/24/20 2126 07/24/20 2130 07/25/20 0332 07/25/20 0631 07/25/20 0632 07/25/20 0840 07/25/20 1500 07/25/20 2255 07/26/20 0456 07/26/20 0501  HGB 15.6  --    < > 14.2 14.5  --  14.6 14.3  --  13.6  --   HCT 44.8  --    < > 40.6 41.5  --  43.0 42.0  --  40.3  --   PLT 240  --   --  201 184  --   --   --   --  181  --   APTT  --   --   --   --  33  --   --  37*  --   --   --   HEPARINUNFRC  --   --   --   --   --   --   --  0.16* 0.36  --  0.28*  CREATININE 0.80  --   --  0.83  --   --   --   --   --   --  0.65  CKTOTAL  --   --   --   --   --  59  --   --   --   --   --   TROPONINIHS 7 9  --   --   --   --   --   --   --   --   --    < > = values in this interval not displayed.    Estimated Creatinine Clearance: 105.6 mL/min (by C-G formula based on SCr of 0.65 mg/dL).  Medical History: Past Medical History:  Diagnosis Date  . Diabetes mellitus without complication (HCC)   . Peripheral vascular disease (HCC)    Medications:  Medications Prior to Admission  Medication Sig Dispense Refill Last Dose  . clopidogrel (PLAVIX) 75 MG tablet TAKE 1 TABLET BY MOUTH EVERY DAY (Patient taking differently: Take 75 mg by mouth daily. ) 90 tablet 3 07/24/2020 at 0600  . ELIQUIS 2.5 MG TABS tablet TAKE 1 TABLET BY MOUTH TWICE A DAY (Patient taking differently: Take 2.5 mg by mouth 2 (two) times daily. ) 60 tablet 4 07/24/2020 at 0600  . FARXIGA 10 MG TABS tablet  Take 10 mg by mouth daily.   07/24/2020 at 0600  . NOVOLIN N 100 UNIT/ML injection Inject 0-30 Units into the skin 2 (two) times daily before a meal.    07/23/2020 at 2300  . pantoprazole (PROTONIX) 40 MG tablet Take 40 mg by mouth 2 (two) times daily.   07/24/2020 at 0600  . docusate sodium (COLACE) 100 MG capsule Take 1 capsule (100 mg total) by mouth 2 (two) times daily. 60 capsule 2 unknown at prn  . OZEMPIC, 0.25 OR 0.5 MG/DOSE, 2 MG/1.5ML SOPN Inject 0.5 mg into the skin once a week.   07/21/2020 at 0900   Scheduled:  . docusate sodium  100 mg Oral BID  . insulin aspart  0-15 Units Subcutaneous TID WC  . insulin glargine  10 Units Subcutaneous Daily  . pregabalin  75 mg Oral Daily  . senna  1 tablet Oral QHS  . [START ON 07/29/2020] Vitamin D (Ergocalciferol)  50,000 Units Oral Weekly   Infusions:  . sodium chloride 100 mL/hr at 07/26/20 0414  . heparin 1,150 Units/hr (07/26/20 0403)   PRN: labetalol, morphine injection, ondansetron **OR** ondansetron (ZOFRAN) IV Anti-infectives (From admission, onward)   None     Assessment: Pharmacy consulted to start heparin. Pt is on apixaban PTA. No baseline heparin level ordered.   12/8 0655 aPTT 33  12/8 1500 aPTT 37 HL 0.16 12/8 2255 HL 0.36, therapeutic x 1 12/9 0501 HL 0.28, SUBtherapeutic  Goal of Therapy:  Heparin level 0.3-0.7 units/ml   Monitor platelets by anticoagulation protocol: Yes   Plan:  Heparin level is SUBtherapeutic. Will increase heparin infusion to 1300 units/hr.  Will order heparin level in 6 hours. CBC daily while on heparin.   Wayland Denis, PharmD 07/26/2020,6:59 AM

## 2020-07-26 NOTE — Plan of Care (Signed)

## 2020-07-27 LAB — COMPREHENSIVE METABOLIC PANEL
ALT: 313 U/L — ABNORMAL HIGH (ref 0–44)
AST: 46 U/L — ABNORMAL HIGH (ref 15–41)
Albumin: 3.3 g/dL — ABNORMAL LOW (ref 3.5–5.0)
Alkaline Phosphatase: 169 U/L — ABNORMAL HIGH (ref 38–126)
Anion gap: 8 (ref 5–15)
BUN: 8 mg/dL (ref 6–20)
CO2: 24 mmol/L (ref 22–32)
Calcium: 8.3 mg/dL — ABNORMAL LOW (ref 8.9–10.3)
Chloride: 106 mmol/L (ref 98–111)
Creatinine, Ser: 0.54 mg/dL — ABNORMAL LOW (ref 0.61–1.24)
GFR, Estimated: >60 mL/min (ref >60)
Glucose, Bld: 148 mg/dL — ABNORMAL HIGH (ref 70–99)
Potassium: 3.9 mmol/L (ref 3.5–5.1)
Sodium: 138 mmol/L (ref 135–145)
Total Bilirubin: 0.8 mg/dL (ref 0.3–1.2)
Total Protein: 6.3 g/dL — ABNORMAL LOW (ref 6.5–8.1)

## 2020-07-27 LAB — CBC
HCT: 39.5 % (ref 39.0–52.0)
Hemoglobin: 13.7 g/dL (ref 13.0–17.0)
MCH: 30.4 pg (ref 26.0–34.0)
MCHC: 34.7 g/dL (ref 30.0–36.0)
MCV: 87.6 fL (ref 80.0–100.0)
Platelets: 186 10*3/uL (ref 150–400)
RBC: 4.51 MIL/uL (ref 4.22–5.81)
RDW: 13.5 % (ref 11.5–15.5)
WBC: 4.8 10*3/uL (ref 4.0–10.5)
nRBC: 0 % (ref 0.0–0.2)

## 2020-07-27 LAB — GLUCOSE, CAPILLARY
Glucose-Capillary: 136 mg/dL — ABNORMAL HIGH (ref 70–99)
Glucose-Capillary: 209 mg/dL — ABNORMAL HIGH (ref 70–99)

## 2020-07-27 LAB — HEPARIN LEVEL (UNFRACTIONATED): Heparin Unfractionated: 0.33 IU/mL (ref 0.30–0.70)

## 2020-07-27 NOTE — Consult Note (Signed)
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 Naproxen Anaphylaxis  . Liraglutide Nausea Only   Patient Measurements: Height: 5\' 9"  (175.3 cm) Weight: 83.5 kg (184 lb 0.3 oz) IBW/kg (Calculated) : 70.7 Heparin Dosing Weight: 79.7 kg  Vital Signs: Temp: 97.9 F (36.6 C) (12/10 0755) Temp Source: Oral (12/10 0755) BP: 127/86 (12/10 0755) Pulse Rate: 72 (12/10 0755)  Labs: Recent Labs    07/24/20 1430 07/24/20 2126 07/24/20 2130 07/25/20 0332 07/25/20 0631 07/25/20 0632 07/25/20 0840 07/25/20 1500 07/25/20 2255 07/26/20 0456 07/26/20 0501 07/26/20 1256 07/26/20 1856 07/27/20 0634  HGB 15.6  --    < > 14.2 14.5  --    < > 14.3  --  13.6  --   --   --  13.7  HCT 44.8  --    < > 40.6 41.5  --    < > 42.0  --  40.3  --   --   --  39.5  PLT 240  --   --  201 184  --   --   --   --  181  --   --   --  186  APTT  --   --   --   --  33  --   --  37*  --   --   --   --   --   --   HEPARINUNFRC  --   --   --   --   --   --   --  0.16*   < >  --  0.28* 0.38 0.37 0.33  CREATININE 0.80  --   --  0.83  --   --   --   --   --   --  0.65  --   --  0.54*  CKTOTAL  --   --   --   --   --  59  --   --   --   --   --   --   --   --   TROPONINIHS 7 9  --   --   --   --   --   --   --   --   --   --   --   --    < > = values in this interval not displayed.    Estimated Creatinine Clearance: 105.6 mL/min (A) (by C-G formula based on SCr of 0.54 mg/dL (L)).  Medical History: Past Medical History:  Diagnosis Date  . Diabetes mellitus without complication (HCC)   . History of echocardiogram    a. 07/2020 Echo: EF 60-65%, no rwma, mild LVH. Nl RV fxn. Mildly dil LA.  08/2020 History of tobacco abuse   . Hyperlipidemia   . Peripheral vascular disease (HCC)    a. 05/2019 s/p PTA R popliteal/AT, mech thrombectomy to R popliteal/tibioperoneal trunk--> plavix & eliquis 2.5 bid  since; 09/2019 LE u/s: near nl study.   Medications:  Medications Prior to Admission  Medication Sig Dispense Refill Last Dose  . clopidogrel (PLAVIX) 75 MG tablet TAKE 1 TABLET BY MOUTH EVERY DAY (Patient taking differently: Take 75 mg by mouth daily. ) 90 tablet 3 07/24/2020 at 0600  . ELIQUIS 2.5 MG TABS tablet TAKE 1 TABLET BY MOUTH TWICE A DAY (Patient taking differently: Take 2.5 mg by mouth 2 (two) times daily. ) 60 tablet  4 07/24/2020 at 0600  . FARXIGA 10 MG TABS tablet Take 10 mg by mouth daily.   07/24/2020 at 0600  . NOVOLIN N 100 UNIT/ML injection Inject 0-30 Units into the skin 2 (two) times daily before a meal.    07/23/2020 at 2300  . pantoprazole (PROTONIX) 40 MG tablet Take 40 mg by mouth 2 (two) times daily.   07/24/2020 at 0600  . docusate sodium (COLACE) 100 MG capsule Take 1 capsule (100 mg total) by mouth 2 (two) times daily. 60 capsule 2 unknown at prn  . OZEMPIC, 0.25 OR 0.5 MG/DOSE, 2 MG/1.5ML SOPN Inject 0.5 mg into the skin once a week.   07/21/2020 at 0900   Scheduled:  . docusate sodium  100 mg Oral BID  . insulin aspart  0-15 Units Subcutaneous TID WC  . insulin glargine  15 Units Subcutaneous Daily  . pregabalin  75 mg Oral Daily  . senna  1 tablet Oral QHS  . [START ON 07/29/2020] Vitamin D (Ergocalciferol)  50,000 Units Oral Weekly   Infusions:  . sodium chloride 100 mL/hr at 07/27/20 0137  . heparin 1,300 Units/hr (07/27/20 0138)   PRN: labetalol, morphine injection, ondansetron **OR** ondansetron (ZOFRAN) IV Anti-infectives (From admission, onward)   None     Assessment: Pharmacy consulted to start heparin. Pt is on apixaban PTA. No baseline heparin level ordered.   12/8 0655 aPTT 33  12/8 1500 aPTT 37 HL 0.16 12/8 2255 HL 0.36, therapeutic x 1 12/9 0501 HL 0.28, SUBtherapeutic 12/9 1256 HL 0.38, therapeutic x 1  12/9 1856 HL 0.37, therapeutic x 2 12/10 0634 HL 0.33, therapeutic x 3.   Goal of Therapy:  Heparin level 0.3-0.7 units/ml   Monitor  platelets by anticoagulation protocol: Yes   Plan:  Heparin level is therapeutic. Will continue heparin infusion at 1300 units/hr.  Will order heparin level and CBC with AM labs.   Paschal Dopp, PharmD, BCPS 07/27/2020 8:16 AM

## 2020-07-27 NOTE — Plan of Care (Signed)
  Problem: Education: Goal: Knowledge of General Education information will improve Description: Including pain rating scale, medication(s)/side effects and non-pharmacologic comfort measures Outcome: Adequate for Discharge   

## 2020-07-27 NOTE — Discharge Summary (Signed)
Justin Adkins QMV:[id number] DOB: February 15, 1966 DOA: 07/24/2020  PCP: [facility]  Admit date: 07/24/2020 Discharge date: 07/27/2020  Time spent: 35 minutes  Recommendations for Outpatient Follow-up:  1. Will need repeat LFTs in 1-2 weeks 2. Patient will schedule f/u with his GI provider  3. F/u pending hemochromatosis dna pcr    Discharge Diagnoses:  Active Problems:   Acute hepatitis   Discharge Condition: good  Diet recommendation: regular  Filed Weights   07/25/20 0026 07/26/20 0646 07/27/20 0418  Weight: 79.7 kg 83.3 kg 83.5 kg    History of present illness:  Justin Adkins  is a 54 y.o. Caucasian male with a known history of type 2 diabetes mellitus, peripheral vascular disease, dyslipidemia and peripheral vascular disease, who presented to the emergency room with acute onset of epigastric and right upper quadrant abdominal pain that started Sunday evening with nausea on Saturday morning and vomiting starting on Saturday evening.  He stated that last night around 3 AM he woke up with vomiting of brownish fluid without bright red blood.  He has been having chronic mucousy bloody bowel movements with occasional bright red blood without melena.  He has hemorrhoids.  He stated that he had a colonoscopy in August 2020 at [facility] that they revealed only internal hemorrhoids with recommendation for repeat colonoscopy in 10 years.  He later had an EGD in July of this year that revealed esophagitis.  No chest pain or dyspnea or cough or wheezing.  No dysuria, oliguria or hematuria or flank pain.  No bleeding diathesis.  He works as a Dealer.  No recent toxic exposure.  He is a social alcohol drinker.  He has been taking Lipitor for his dyslipidemia.  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  Upon presentation to the emergency room, vital signs were within normal and later blood  pressure was 160/98 and later 179/113 and heart rate was up to 29.  Labs revealed hyperglycemia of 201 and alk phos 220, AST 1979 and ALT 1283 with total bili of 2.2.  CBC-was within normal.  Urinalysis showed more than 500 glucose and was otherwise unremarkable. Chest x-ray showed no acute cardiopulmonary disease.  Right upper quadrant ultrasound revealed cholelithiasis without secondary signs of acute cholecystitis. EKG showed no sinus rhythm with rate of 85.  Hospital Course:  Acute hepatitis. GI thinks most likely ischemic, though cause of ischemia unclear. TTE w/o signs heart failure, no sepsis or other low-flow state, no AKI. In terms of other etiologies, denies alcohol/drugs. Viral hepatitis panel neg. Tylenol level neg. liver u/s neg including no signs portal vein occlusion or sig intraparenchymal abnormalities. Is on several medications that could cause LFT elevation but GI thinks unlikely the cause of these more severe LFT elevations and is OK with resuming home meds.Elba Barman this far does show possible iron overload and hemochromatosis dna pcr is pending. Here LFTs trended down nicely and epigastric pain resolved promptly. - f/u [facility] and pcp 1-2 weeks for check of LFTs  Hypertensive urgency. Resolved  Peripheral vascular disease. -resume home plavix/eliquis  Type 2 diabetes mellitus. - resume home meds  Dyslipidemia. -resume home statin therapy  Procedures:  none   Consultations:  GI  Discharge Exam: Vitals:   07/27/20 0755 07/27/20 1128  BP: 127/86 120/68  Pulse: 72 78  Resp: 17 17  Temp: 97.9 F (36.6 C) 98.2 F (36.8 C)  SpO2:  97% 99%    General exam: Appears calm and comfortable  Respiratory system: Clear to auscultation. Respiratory effort normal. Cardiovascular system: S1 & S2 heard, RRR. No JVD, murmurs, rubs, gallops or clicks. No pedal edema. Gastrointestinal system: Abdomen is nondistended, soft and nontender. No organomegaly or masses  felt. Normal bowel sounds heard. Central nervous system: Alert and oriented. No focal neurological deficits. Extremities: Symmetric 5 x 5 power. Skin: No rashes, lesions or ulcers Psychiatry: Judgement and insight appear normal. Mood & affect appropriate.   Discharge Instructions   Discharge Instructions    Call MD for:  difficulty breathing, headache or visual disturbances   Complete by: As directed    Call MD for:  extreme fatigue   Complete by: As directed    Call MD for:  persistant dizziness or light-headedness   Complete by: As directed    Call MD for:  persistant nausea and vomiting   Complete by: As directed    Call MD for:  severe uncontrolled pain   Complete by: As directed    Diet - low sodium heart healthy   Complete by: As directed    Increase activity slowly   Complete by: As directed      Allergies as of 07/27/2020      Reactions   Naproxen Anaphylaxis   Liraglutide Nausea Only      Medication List    TAKE these medications   clopidogrel 75 MG tablet Commonly known as: PLAVIX TAKE 1 TABLET BY MOUTH EVERY DAY   docusate sodium 100 MG capsule Commonly known as: Colace Take 1 capsule (100 mg total) by mouth 2 (two) times daily.   Eliquis 2.5 MG Tabs tablet Generic drug: apixaban TAKE 1 TABLET BY MOUTH TWICE A DAY What changed: how much to take   Farxiga 10 MG Tabs tablet Generic drug: dapagliflozin propanediol Take 10 mg by mouth daily.   NovoLIN N 100 UNIT/ML injection Generic drug: insulin NPH Human Inject 0-30 Units into the skin 2 (two) times daily before a meal.   Ozempic (0.25 or 0.5 MG/DOSE) 2 MG/1.5ML Sopn Generic drug: Semaglutide(0.25 or 0.5MG/DOS) Inject 0.5 mg into the skin once a week.   pantoprazole 40 MG tablet Commonly known as: PROTONIX Take 40 mg by mouth 2 (two) times daily.      Allergies  Allergen Reactions  . Naproxen Anaphylaxis  . Liraglutide Nausea Only    Follow-up Information    Lesly Rubenstein, MD.    Specialty: Gastroenterology Contact information: [place] [address] [phone]        Evans Lance, MD.   Specialty: Cardiology Contact information: [phone] [address] [place] [address] [phone]                The results of significant diagnostics from this hospitalization (including imaging, microbiology, ancillary and laboratory) are listed below for reference.    Significant Diagnostic Studies: DG Chest Portable 1 View  Result Date: 07/24/2020 CLINICAL DATA:  Shortness of breath EXAM: PORTABLE CHEST 1 VIEW COMPARISON:  04/18/2009 FINDINGS: The heart size and mediastinal contours are within normal limits. Both lungs are clear. The visualized skeletal structures are unremarkable. IMPRESSION: No active disease. Electronically Signed   By: Donavan Foil M.D.   On: 07/24/2020 18:47   ECHOCARDIOGRAM COMPLETE  Result Date: 07/26/2020    ECHOCARDIOGRAM REPORT   Patient Name:   LASEAN GORNIAK Date of Exam: 07/25/2020 Medical Rec #:  [id number]  Height:       69.0 in Accession #:    [id number]     Weight:       175.8 lb Date of Birth:  1965-12-22      BSA:          1.956 m Patient Age:    89 years       BP:           128/73 mmHg Patient Gender: M              HR:           70 bpm. Exam Location:  [facility] Procedure: 2D Echo, Cardiac Doppler and Color Doppler Indications:     Epigastric pain  History:         Patient has no prior history of Echocardiogram examinations.                  Risk Factors:Diabetes. Peripheral vascular disease.  Sonographer:     Wilford Sports Rodgers-Jones Referring Phys:  Silverthorne Fritzie.Siad Diagnosing Phys: Bartholome Bill MD IMPRESSIONS  1. Left ventricular ejection fraction, by estimation, is 60 to 65%. The left ventricle has normal function. The left ventricle has no regional wall motion abnormalities. There is mild left ventricular hypertrophy. Left ventricular diastolic parameters were normal.  2. Right  ventricular systolic function is normal. The right ventricular size is normal.  3. Left atrial size was mildly dilated.  4. The mitral valve is grossly normal. No evidence of mitral valve regurgitation.  5. The aortic valve is grossly normal. Aortic valve regurgitation is not visualized.  6. Aortic dilatation noted. FINDINGS  Left Ventricle: Left ventricular ejection fraction, by estimation, is 60 to 65%. The left ventricle has normal function. The left ventricle has no regional wall motion abnormalities. The left ventricular internal cavity size was normal in size. There is  mild left ventricular hypertrophy. Left ventricular diastolic parameters were normal. Right Ventricle: The right ventricular size is normal. No increase in right ventricular wall thickness. Right ventricular systolic function is normal. Left Atrium: Left atrial size was mildly dilated. Right Atrium: Right atrial size was normal in size. Pericardium: There is no evidence of pericardial effusion. Mitral Valve: The mitral valve is grossly normal. No evidence of mitral valve regurgitation. Tricuspid Valve: The tricuspid valve is grossly normal. Tricuspid valve regurgitation is mild. Aortic Valve: The aortic valve is grossly normal. Aortic valve regurgitation is not visualized. Pulmonic Valve: The pulmonic valve was not well visualized. Pulmonic valve regurgitation is mild. Aorta: Aortic dilatation noted. IAS/Shunts: The atrial septum is grossly normal.  LEFT VENTRICLE PLAX 2D LVIDd:         4.56 cm  Diastology LVIDs:         2.93 cm  LV e' medial:    8.49 cm/s LV PW:         1.07 cm  LV E/e' medial:  8.2 LV IVS:        0.91 cm  LV e' lateral:   11.20 cm/s LVOT diam:     2.20 cm  LV E/e' lateral: 6.2 LV SV:         76 LV SV Index:   39 LVOT Area:     3.80 cm  RIGHT VENTRICLE             IVC RV Basal diam:  3.59 cm     IVC diam: 1.91 cm RV S prime:     25.90 cm/s TAPSE (M-mode): 2.5 cm  LEFT ATRIUM             Index       RIGHT ATRIUM           Index  LA diam:        4.20 cm 2.15 cm/m  RA Area:     14.00 cm LA Vol (A2C):   35.8 ml 18.30 ml/m RA Volume:   39.30 ml  20.09 ml/m LA Vol (A4C):   25.3 ml 12.94 ml/m LA Biplane Vol: 30.9 ml 15.80 ml/m  AORTIC VALVE LVOT Vmax:   107.00 cm/s LVOT Vmean:  72.100 cm/s LVOT VTI:    0.199 m  AORTA Ao Root diam: 3.50 cm Ao Asc diam:  3.60 cm MITRAL VALVE MV Area (PHT): 4.21 cm    SHUNTS MV Decel Time: 180 msec    Systemic VTI:  0.20 m MV E velocity: 69.40 cm/s  Systemic Diam: 2.20 cm MV A velocity: 60.40 cm/s MV E/A ratio:  1.15 Bartholome Bill MD Electronically signed by Bartholome Bill MD Signature Date/Time: 07/26/2020/7:40:17 AM    Final    US Abdomen Limited RUQ (LIVER/GB)  Result Date: 07/24/2020 CLINICAL DATA:  Right upper quadrant abdominal pain EXAM: ULTRASOUND ABDOMEN LIMITED RIGHT UPPER QUADRANT COMPARISON:  None. FINDINGS: Gallbladder: The gallbladder is filled with stones. There is no evidence for gallbladder wall thickening. The sonographic Percell Miller sign is negative. Common bile duct: Diameter: 4 mm Liver: No focal lesion identified. Within normal limits in parenchymal echogenicity. Portal vein is patent on color Doppler imaging with normal direction of blood flow towards the liver. Other: None. IMPRESSION: There is cholelithiasis without secondary signs of acute cholecystitis. Electronically Signed   By: Constance Holster M.D.   On: 07/24/2020 19:18    Microbiology: Recent Results (from the past 240 hour(s))  Resp Panel by RT-PCR (Flu A&B, Covid) Nasopharyngeal Swab     Status: None   Collection Time: 07/24/20  6:12 PM   Specimen: Nasopharyngeal Swab; Nasopharyngeal(NP) swabs in vial transport medium  Result Value Ref Range Status   SARS Coronavirus 2 by RT PCR NEGATIVE NEGATIVE Final    Comment: (NOTE) SARS-CoV-2 target nucleic acids are NOT DETECTED.  The SARS-CoV-2 RNA is generally detectable in upper respiratory specimens during the acute phase of infection. The lowest concentration of  SARS-CoV-2 viral copies this assay can detect is 138 copies/mL. A negative result does not preclude SARS-Cov-2 infection and should not be used as the sole basis for treatment or other patient management decisions. A negative result may occur with  improper specimen collection/handling, submission of specimen other than nasopharyngeal swab, presence of viral mutation(s) within the areas targeted by this assay, and inadequate number of viral copies(<138 copies/mL). A negative result must be combined with clinical observations, patient history, and epidemiological information. The expected result is Negative.  Fact Sheet for Patients:  EntrepreneurPulse.com.au  Fact Sheet for Healthcare Providers:  IncredibleEmployment.be  This test is no t yet approved or cleared by the [place] FDA and  has been authorized for detection and/or diagnosis of SARS-CoV-2 by FDA under an Emergency Use Authorization (EUA). This EUA will remain  in effect (meaning this test can be used) for the duration of the COVID-19 declaration under Section 564(b)(1) of the Act, 21 U.S.C.section 360bbb-3(b)(1), unless the authorization is terminated  or revoked sooner.       Influenza A by PCR NEGATIVE NEGATIVE Final   Influenza B by PCR NEGATIVE NEGATIVE Final    Comment: (NOTE) The Xpert Xpress  SARS-CoV-2/FLU/RSV plus assay is intended as an aid in the diagnosis of influenza from Nasopharyngeal swab specimens and should not be used as a sole basis for treatment. Nasal washings and aspirates are unacceptable for Xpert Xpress SARS-CoV-2/FLU/RSV testing.  Fact Sheet for Patients: EntrepreneurPulse.com.au  Fact Sheet for Healthcare Providers: IncredibleEmployment.be  This test is not yet approved or cleared by the [place] FDA and has been authorized for detection and/or diagnosis of SARS-CoV-2 by FDA under an Emergency Use  Authorization (EUA). This EUA will remain in effect (meaning this test can be used) for the duration of the COVID-19 declaration under Section 564(b)(1) of the Act, 21 U.S.C. section 360bbb-3(b)(1), unless the authorization is terminated or revoked.  Performed at [facility], [address] [place], [place] [address]      Labs: Basic Metabolic Panel: Recent Labs  Lab 07/24/20 1430 07/25/20 0332 07/26/20 0501 07/27/20 0634  NA 136 138 138 138  K 3.9 3.9 4.1 3.9  CL 103 104 106 106  CO2 '22 25 23 24  ' GLUCOSE 201* 219* 174* 148*  BUN '15 15 10 8  ' CREATININE 0.80 0.83 0.65 0.54*  CALCIUM 9.3 8.4* 8.1* 8.3*   Liver Function Tests: Recent Labs  Lab 07/24/20 1430 07/25/20 0332 07/26/20 0501 07/27/20 0634  AST 1,979* 651* 137* 46*  ALT 1,283* 992* 551* 313*  ALKPHOS 220* 223* 179* 169*  BILITOT 2.2* 1.8* 1.0 0.8  PROT 7.9 7.1 6.3* 6.3*  ALBUMIN 4.3 3.8 3.5 3.3*   Recent Labs  Lab 07/24/20 1430  LIPASE 28   No results for input(s): AMMONIA in the last 168 hours. CBC: Recent Labs  Lab 07/24/20 1430 07/24/20 2130 07/25/20 0332 07/25/20 0631 07/25/20 0840 07/25/20 1500 07/26/20 0456 07/27/20 0634  WBC 4.5  --  4.5 3.9*  --   --  4.3 4.8  HGB 15.6   < > 14.2 14.5 14.6 14.3 13.6 13.7  HCT 44.8   < > 40.6 41.5 43.0 42.0 40.3 39.5  MCV 85.7  --  86.9 87.4  --   --  89.8 87.6  PLT 240  --  201 184  --   --  181 186   < > = values in this interval not displayed.   Cardiac Enzymes: Recent Labs  Lab 07/25/20 0632  CKTOTAL 59   BNP: BNP (last 3 results) Recent Labs    07/24/20 1430 07/26/20 0456  BNP 26.2 26.4    ProBNP (last 3 results) No results for input(s): PROBNP in the last 8760 hours.  CBG: Recent Labs  Lab 07/26/20 1144 07/26/20 1642 07/26/20 2119 07/27/20 0756 07/27/20 1129  GLUCAP 242* 115* 174* 136* 209*       Signed:  Desma Maxim MD.  [facility] 07/27/2020, 1:06 PM

## 2020-07-27 NOTE — Plan of Care (Signed)

## 2020-07-30 LAB — HEMOCHROMATOSIS DNA-PCR(C282Y,H63D)

## 2020-08-09 ENCOUNTER — Other Ambulatory Visit: Payer: Self-pay | Admitting: Gastroenterology

## 2020-08-09 DIAGNOSIS — R748 Abnormal levels of other serum enzymes: Secondary | ICD-10-CM

## 2020-08-13 ENCOUNTER — Ambulatory Visit: Payer: BC Managed Care – PPO | Attending: Internal Medicine

## 2020-08-13 DIAGNOSIS — Z23 Encounter for immunization: Secondary | ICD-10-CM

## 2020-08-13 NOTE — Progress Notes (Signed)
   Covid-19 Vaccination Clinic  Name:  Justin Adkins    MRN: [id number] DOB: 1966-07-06  08/13/2020  [name] was observed post Covid-19 immunization for 15 minutes without incident. He was provided with Vaccine Information Sheet and instruction to access the V-Safe system.   [name] was instructed to call 911 with any severe reactions post vaccine: Marland Kitchen Difficulty breathing  . Swelling of face and throat  . A fast heartbeat  . A bad rash all over body  . Dizziness and weakness   Immunizations Administered    Name Date Dose VIS Date Route   Pfizer COVID-19 Vaccine 08/13/2020  1:43 PM 0.3 mL 06/06/2020 Intramuscular   Manufacturer: [employer], [employer]   Lot: [id number]   NDC: [address]-8

## 2020-08-15 ENCOUNTER — Ambulatory Visit
Admission: RE | Admit: 2020-08-15 | Discharge: 2020-08-15 | Disposition: A | Discharge status: Home / Self Care | Payer: BC Managed Care – PPO | Source: Ambulatory Visit | Attending: Gastroenterology | Admitting: Gastroenterology

## 2020-08-15 ENCOUNTER — Other Ambulatory Visit: Payer: Self-pay

## 2020-08-15 DIAGNOSIS — R748 Abnormal levels of other serum enzymes: Secondary | ICD-10-CM

## 2020-08-16 ENCOUNTER — Ambulatory Visit
Admission: RE | Admit: 2020-08-16 | Discharge: 2020-08-16 | Disposition: A | Discharge status: Home / Self Care | Payer: BC Managed Care – PPO | Source: Ambulatory Visit | Attending: Gastroenterology | Admitting: Gastroenterology

## 2020-08-16 ENCOUNTER — Other Ambulatory Visit: Payer: Self-pay

## 2020-08-16 DIAGNOSIS — R748 Abnormal levels of other serum enzymes: Secondary | ICD-10-CM | POA: Insufficient documentation

## 2020-08-16 MED ORDER — IOHEXOL 300 MG/ML  SOLN
100.0000 mL | Freq: Once | INTRAMUSCULAR | Status: AC | PRN
  Administered 2020-08-16: 100 mL via INTRAVENOUS

## 2020-08-20 ENCOUNTER — Ambulatory Visit
Admission: RE | Admit: 2020-08-20 | Discharge: 2020-08-20 | Disposition: A | Discharge status: Home / Self Care | Payer: BC Managed Care – PPO | Source: Ambulatory Visit | Attending: Gastroenterology | Admitting: Gastroenterology

## 2020-08-20 ENCOUNTER — Other Ambulatory Visit: Payer: Self-pay

## 2020-08-20 DIAGNOSIS — R748 Abnormal levels of other serum enzymes: Secondary | ICD-10-CM | POA: Diagnosis not present

## 2020-08-24 ENCOUNTER — Ambulatory Visit: Payer: BC Managed Care – PPO | Admitting: Cardiovascular Disease

## 2020-09-03 ENCOUNTER — Ambulatory Visit: Payer: Self-pay | Admitting: Cardiology

## 2020-09-04 ENCOUNTER — Ambulatory Visit: Payer: Self-pay | Admitting: Cardiology

## 2020-09-10 ENCOUNTER — Encounter: Payer: Self-pay | Admitting: Cardiology

## 2020-09-10 ENCOUNTER — Ambulatory Visit (INDEPENDENT_AMBULATORY_CARE_PROVIDER_SITE_OTHER): Payer: BC Managed Care – PPO | Admitting: Cardiology

## 2020-09-10 ENCOUNTER — Other Ambulatory Visit: Payer: Self-pay

## 2020-09-10 VITALS — BP 120/80 | HR 84 | Ht 69.0 in | Wt 185.0 lb

## 2020-09-10 DIAGNOSIS — I739 Peripheral vascular disease, unspecified: Secondary | ICD-10-CM | POA: Diagnosis not present

## 2020-09-10 DIAGNOSIS — E785 Hyperlipidemia, unspecified: Secondary | ICD-10-CM

## 2020-09-10 DIAGNOSIS — R072 Precordial pain: Secondary | ICD-10-CM | POA: Diagnosis not present

## 2020-09-10 MED ORDER — ATORVASTATIN CALCIUM 20 MG PO TABS: 20.0000 mg | ORAL_TABLET | Freq: Every day | ORAL | 5 refills | Status: DC

## 2020-09-10 MED ORDER — METOPROLOL TARTRATE 100 MG PO TABS: 100.0000 mg | ORAL_TABLET | Freq: Once | ORAL | 0 refills | Status: DC

## 2020-09-10 NOTE — Progress Notes (Signed)
Cardiology Office Note:    Date:  09/10/2020   ID:  Justin Adkins, DOB 11-18-1965, MRN [id number]  PCP:  [facility]  [facility] Cardiologist:  Justin Sable, MD  [place] Electrophysiologist:  None   Referring MD: Justin Edinger, MD   Chief Complaint  Patient presents with  . NEW patient-DOE    Patient would like to discuss whether or not he needs to continue taking two blood thinners   Justin Adkins is a 55 y.o. male who is being seen today for the evaluation of shortness of breath at the request of Wouk, Ailene Rud, MD.   History of Present Illness:    Justin Adkins is a 55 y.o. male with a hx of diabetes, PAD ( R. Popliteal PTA) former smoker x35+ years who presents with shortness of breath.  Patient states having shortness of breath with ordinary exertion, ongoing over the past 3 months or so.  He has occasional chest pressure about once or twice a month not related with exertion.  His brother had a heart attack around age 49.  Has right lower extremity pain due to peripheral artery disease, follows up with vascular surgery, percutaneous angioplasty and mechanical thrombectomy to the right popliteal artery was performed in 2020 by vascular surgery.  Echo on 07/2020 showed normal systolic function, normal diastolic function, EF 60 to 65%, mild LA dilatation.  Past Medical History:  Diagnosis Date  . Diabetes mellitus without complication ([place])   . History of echocardiogram    a. 07/2020 Echo: EF 60-65%, no rwma, mild LVH. Nl RV fxn. Mildly dil LA.  Marland Kitchen History of tobacco abuse   . Hyperlipidemia   . Peripheral vascular disease ([place])    a. 05/2019 s/p PTA R popliteal/AT, mech thrombectomy to R popliteal/tibioperoneal trunk--> plavix & eliquis 2.5 bid since; 09/2019 LE u/s: near nl study.    Past Surgical History:  Procedure Laterality Date  . ESOPHAGOGASTRODUODENOSCOPY (EGD) WITH PROPOFOL N/A 02/28/2020   Procedure: ESOPHAGOGASTRODUODENOSCOPY  (EGD) WITH PROPOFOL;  Surgeon: Justin Rubenstein, MD;  Location: [facility];  Service: Endoscopy;  Laterality: N/A;  . HERNIA REPAIR    . left foot surgery     neuroma  . LOWER EXTREMITY ANGIOGRAPHY Right 05/25/2019   Procedure: LOWER EXTREMITY ANGIOGRAPHY;  Surgeon: Justin Cabal, MD;  Location: [place] [facility];  Service: Cardiovascular;  Laterality: Right;  . TONSILLECTOMY      Current Medications: Current Meds  Medication Sig  . atorvastatin (LIPITOR) 20 MG tablet Take 1 tablet (20 mg total) by mouth daily.  . clopidogrel (PLAVIX) 75 MG tablet TAKE 1 TABLET BY MOUTH EVERY DAY  . docusate sodium (COLACE) 100 MG capsule Take 100 mg by mouth 2 (two) times daily as needed for mild constipation.  Marland Kitchen ELIQUIS 2.5 MG TABS tablet TAKE 1 TABLET BY MOUTH TWICE A DAY  . FARXIGA 10 MG TABS tablet Take 10 mg by mouth daily.  . metoprolol tartrate (LOPRESSOR) 100 MG tablet Take 1 tablet (100 mg total) by mouth once for 1 dose. Take 2 hours prior to your CT scan.  Marland Kitchen NOVOLIN N 100 UNIT/ML injection Inject 0-30 Units into the skin 2 (two) times daily before a meal.   . OZEMPIC, 0.25 OR 0.5 MG/DOSE, 2 MG/1.5ML SOPN Inject 0.5 mg into the skin once a week.  . pantoprazole (PROTONIX) 40 MG tablet Take 40 mg by mouth 2 (two) times daily.     Allergies:   Naproxen  and Liraglutide   Social History   Socioeconomic History  . Marital status: Married    Spouse name: Not on file  . Number of children: Not on file  . Years of education: Not on file  . Highest education level: Not on file  Occupational History  . Not on file  Tobacco Use  . Smoking status: Former Smoker    Packs/day: 1.00    Years: 35.00    Pack years: 35.00  . Smokeless tobacco: Never Used  Vaping Use  . Vaping Use: Never used  Substance and Sexual Activity  . Alcohol use: Not Currently  . Drug use: No  . Sexual activity: Yes    Birth control/protection: None  Other Topics Concern  . Not on file  Social History  Narrative   Lives in [place] w/ wife and son.  Does not routinely exercise.   Social Determinants of Health   Financial Resource Strain: Not on file  Food Insecurity: Not on file  Transportation Needs: Not on file  Physical Activity: Not on file  Stress: Not on file  Social Connections: Not on file     Family History: The patient's family history includes Cancer in his father; Heart attack in his brother.  ROS:   Please see the history of present illness.     All other systems reviewed and are negative.  EKGs/Labs/Other Studies Reviewed:    The following studies were reviewed today:   EKG:  EKG is  ordered today.  The ekg ordered today demonstrates normal sinus rhythm, normal ECG  Recent Labs: 07/25/2020: TSH 0.760 07/26/2020: B Natriuretic Peptide 26.4 07/27/2020: ALT 313; BUN 8; Creatinine, Ser 0.54; Hemoglobin 13.7; Platelets 186; Potassium 3.9; Sodium 138  Recent Lipid Panel No results found for: CHOL, TRIG, HDL, CHOLHDL, VLDL, LDLCALC, LDLDIRECT   Risk Assessment/Calculations:      Physical Exam:    VS:  BP 120/80 (BP Location: Right Arm, Patient Position: Sitting, Cuff Size: Normal)   Pulse 84   Ht '5\' 9"'  (1.753 m)   Wt 185 lb (83.9 kg)   SpO2 97%   BMI 27.32 kg/m     Wt Readings from Last 3 Encounters:  09/10/20 185 lb (83.9 kg)  07/27/20 184 lb 0.3 oz (83.5 kg)  02/28/20 176 lb (79.8 kg)     GEN:  Well nourished, well developed in no acute distress HEENT: Normal NECK: No JVD; No carotid bruits LYMPHATICS: No lymphadenopathy CARDIAC: RRR, no murmurs, rubs, gallops RESPIRATORY:  Clear to auscultation without rales, wheezing or rhonchi  ABDOMEN: Soft, non-tender, non-distended MUSCULOSKELETAL:  No edema; No deformity  SKIN: Warm and dry NEUROLOGIC:  Alert and oriented x 3 PSYCHIATRIC:  Normal affect   ASSESSMENT:    1. Precordial pain   2. PAD (peripheral artery disease) ([place])   3. Hyperlipidemia LDL goal <70    PLAN:    In order of problems  listed above:  1. Patient with dyspnea on exertion, occasional chest pain.  Has risk factors of family history of early CAD, peripheral artery disease, former smoker.  Recent echo showed normal systolic and diastolic function.  Obtain coronary CTA to evaluate presence of CAD. 2. PAD, follows up with vascular surgery.  Continue Plavix.  Recommend he seeks vascular surgery input regarding continuation of Eliquis.  Unsure if this is needed. 3. History of PAD, goal LDL less than 70.  Start Lipitor 20 mg daily.  Follow-up after coronary CTA.      Medication Adjustments/Labs and Tests  Ordered: Current medicines are reviewed at length with the patient today.  Concerns regarding medicines are outlined above.  Orders Placed This Encounter  Procedures  . CT CORONARY MORPH W/CTA COR W/SCORE W/CA W/CM &/OR WO/CM  . CT CORONARY FRACTIONAL FLOW RESERVE DATA PREP  . CT CORONARY FRACTIONAL FLOW RESERVE FLUID ANALYSIS  . Basic metabolic panel  . EKG 12-Lead   Meds ordered this encounter  Medications  . metoprolol tartrate (LOPRESSOR) 100 MG tablet    Sig: Take 1 tablet (100 mg total) by mouth once for 1 dose. Take 2 hours prior to your CT scan.    Dispense:  1 tablet    Refill:  0  . atorvastatin (LIPITOR) 20 MG tablet    Sig: Take 1 tablet (20 mg total) by mouth daily.    Dispense:  30 tablet    Refill:  5    Patient Instructions  Medication Instructions:   Your physician has recommended you make the following change in your medication:   1.  START taking Liptitor 20 MG: Take 1 tablet by mouth daily.  *If you need a refill on your cardiac medications before your next appointment, please call your pharmacy*   Lab Work:  BMP drawn today.  If you have labs (blood work) drawn today and your tests are completely normal, you will receive your results only by: Marland Kitchen MyChart Message (if you have MyChart) OR . A paper copy in the mail If you have any lab test that is abnormal or we need to  change your treatment, we will call you to review the results.   Testing/Procedures:   Your physician has requested that you have cardiac CT. Cardiac computed tomography (CT) is a painless test that uses an x-ray machine to take clear, detailed pictures of your heart.    Your cardiac CT will be scheduled at:  [facility] [address] [place], [place] [address] [phone]  Please arrive 15 mins early for check-in and test prep.   Please follow these instructions carefully (unless otherwise directed):  Hold all erectile dysfunction medications at least 3 days (72 hrs) prior to test.  On the Night Before the Test: . Be sure to Drink plenty of water. . Do not consume any caffeinated/decaffeinated beverages or chocolate 12 hours prior to your test. . Do not take any antihistamines 12 hours prior to your test.    On the Day of the Test: . Drink plenty of water. Do not drink any water within one hour of the test. . Do not eat any food 4 hours prior to the test. . You may take your regular medications prior to the test.  . Take metoprolol (Lopressor) two hours prior to test. ([facility] was [facility] in [place])        After the Test: . Drink plenty of water. . After receiving IV contrast, you may experience a mild flushed feeling. This is normal. . On occasion, you may experience a mild rash up to 24 hours after the test. This is not dangerous. If this occurs, you can take Benadryl 25 mg and increase your fluid intake. . If you experience trouble breathing, this can be serious. If it is severe call 911 IMMEDIATELY. If it is mild, please call our office. . If you take any of these medications: Glipizide/Metformin, Avandament, Glucavance, please do not take 48 hours after completing test unless otherwise instructed.   Once we have confirmed authorization from your  insurance company, we will call you to set up a date and time for your  test. Based on how quickly your insurance processes prior authorizations requests, please allow up to 4 weeks to be contacted for scheduling your Cardiac CT appointment. Be advised that routine Cardiac CT appointments could be scheduled as many as 8 weeks after your provider has ordered it.  For non-scheduling related questions, please contact the cardiac imaging nurse navigator should you have any questions/concerns: Marchia Bond, Cardiac Imaging Nurse Navigator Burley Saver, Interim Cardiac Imaging Nurse [place] and Vascular Services Direct Office Dial: [phone]   For scheduling needs, including cancellations and rescheduling, please call [place], [phone].    Follow-Up: At [facility], you and your health needs are our priority.  As part of our continuing mission to provide you with exceptional heart care, we have created designated Provider Care Teams.  These Care Teams include your primary Cardiologist (physician) and Advanced Practice Providers (APPs -  Physician Assistants and Nurse Practitioners) who all work together to provide you with the care you need, when you need it.  We recommend signing up for the patient portal called "MyChart".  Sign up information is provided on this After Visit Summary.  MyChart is used to connect with patients for Virtual Visits (Telemedicine).  Patients are able to view lab/test results, encounter notes, upcoming appointments, etc.  Non-urgent messages can be sent to your provider as well.   To learn more about what you can do with MyChart, go to [email or web address].    Your next appointment:   6 week(s)  The format for your next appointment:   In Person  Provider:   Kate Sable, MD   Other Instructions      Signed, Justin Sable, MD  09/10/2020 1:16 PM    [place]

## 2020-09-10 NOTE — Patient Instructions (Signed)
Medication Instructions:   Your physician has recommended you make the following change in your medication:   1.  START taking Liptitor 20 MG: Take 1 tablet by mouth daily.  *If you need a refill on your cardiac medications before your next appointment, please call your pharmacy*   Lab Work:  BMP drawn today.  If you have labs (blood work) drawn today and your tests are completely normal, you will receive your results only by: Marland Kitchen MyChart Message (if you have MyChart) OR . A paper copy in the mail If you have any lab test that is abnormal or we need to change your treatment, we will call you to review the results.   Testing/Procedures:   Your physician has requested that you have cardiac CT. Cardiac computed tomography (CT) is a painless test that uses an x-ray machine to take clear, detailed pictures of your heart.    Your cardiac CT will be scheduled at:  [facility] [address] [place], [place] [address] [phone]  Please arrive 15 mins early for check-in and test prep.   Please follow these instructions carefully (unless otherwise directed):  Hold all erectile dysfunction medications at least 3 days (72 hrs) prior to test.  On the Night Before the Test: . Be sure to Drink plenty of water. . Do not consume any caffeinated/decaffeinated beverages or chocolate 12 hours prior to your test. . Do not take any antihistamines 12 hours prior to your test.    On the Day of the Test: . Drink plenty of water. Do not drink any water within one hour of the test. . Do not eat any food 4 hours prior to the test. . You may take your regular medications prior to the test.  . Take metoprolol (Lopressor) two hours prior to test. ([facility] [place])        After the Test: . Drink plenty of water. . After receiving IV contrast, you may experience a mild flushed feeling. This is normal. . On occasion, you may  experience a mild rash up to 24 hours after the test. This is not dangerous. If this occurs, you can take Benadryl 25 mg and increase your fluid intake. . If you experience trouble breathing, this can be serious. If it is severe call 911 IMMEDIATELY. If it is mild, please call our office. . If you take any of these medications: Glipizide/Metformin, Avandament, Glucavance, please do not take 48 hours after completing test unless otherwise instructed.   Once we have confirmed authorization from your insurance company, we will call you to set up a date and time for your test. Based on how quickly your insurance processes prior authorizations requests, please allow up to 4 weeks to be contacted for scheduling your Cardiac CT appointment. Be advised that routine Cardiac CT appointments could be scheduled as many as 8 weeks after your provider has ordered it.  For non-scheduling related questions, please contact the cardiac imaging nurse navigator should you have any questions/concerns: Marchia Bond, Cardiac Imaging Nurse Navigator Burley Saver, Interim Cardiac Imaging Nurse [place] and Vascular Services Direct Office Dial: [phone]   For scheduling needs, including cancellations and rescheduling, please call [place], [phone].    Follow-Up: At [facility], you and your health needs are our priority.  As part of our continuing mission to provide you with exceptional heart care, we have created designated Provider Care Teams.  These Care Teams include  your primary Cardiologist (physician) and Advanced Practice Providers (APPs -  Physician Assistants and Nurse Practitioners) who all work together to provide you with the care you need, when you need it.  We recommend signing up for the patient portal called "MyChart".  Sign up information is provided on this After Visit Summary.  MyChart is used to connect with patients for Virtual Visits (Telemedicine).  Patients are able to  view lab/test results, encounter notes, upcoming appointments, etc.  Non-urgent messages can be sent to your provider as well.   To learn more about what you can do with MyChart, go to [email or web address].    Your next appointment:   6 week(s)  The format for your next appointment:   In Person  Provider:   Kate Sable, MD   Other Instructions

## 2020-09-11 LAB — BASIC METABOLIC PANEL
BUN/Creatinine Ratio: 13 (ref 9–20)
BUN: 13 mg/dL (ref 6–24)
CO2: 21 mmol/L (ref 20–29)
Calcium: 9.6 mg/dL (ref 8.7–10.2)
Chloride: 102 mmol/L (ref 96–106)
Creatinine, Ser: 0.97 mg/dL (ref 0.76–1.27)
GFR calc Af Amer: 102 mL/min/{1.73_m2} (ref >59)
GFR calc non Af Amer: 88 mL/min/{1.73_m2} (ref >59)
Glucose: 159 mg/dL — ABNORMAL HIGH (ref 65–99)
Potassium: 4.5 mmol/L (ref 3.5–5.2)
Sodium: 141 mmol/L (ref 134–144)

## 2020-09-16 ENCOUNTER — Other Ambulatory Visit (INDEPENDENT_AMBULATORY_CARE_PROVIDER_SITE_OTHER): Payer: Self-pay | Admitting: Vascular Surgery

## 2020-09-17 ENCOUNTER — Other Ambulatory Visit (INDEPENDENT_AMBULATORY_CARE_PROVIDER_SITE_OTHER): Payer: Self-pay | Admitting: Vascular Surgery

## 2020-09-17 NOTE — Telephone Encounter (Signed)
I called the pt and told him to get in touch with his insurance to see if they would cover xarelto

## 2020-09-18 ENCOUNTER — Telehealth (INDEPENDENT_AMBULATORY_CARE_PROVIDER_SITE_OTHER): Payer: Self-pay

## 2020-09-18 ENCOUNTER — Other Ambulatory Visit (INDEPENDENT_AMBULATORY_CARE_PROVIDER_SITE_OTHER): Payer: Self-pay | Admitting: Nurse Practitioner

## 2020-09-18 MED ORDER — RIVAROXABAN 2.5 MG PO TABS: 2.5000 mg | ORAL_TABLET | Freq: Two times a day (BID) | ORAL | 6 refills | Status: DC

## 2020-09-18 NOTE — Telephone Encounter (Signed)
Patient has being made aware 

## 2020-09-18 NOTE — Telephone Encounter (Signed)
sent 

## 2020-09-20 ENCOUNTER — Other Ambulatory Visit: Payer: Self-pay

## 2020-09-20 ENCOUNTER — Other Ambulatory Visit
Admission: RE | Admit: 2020-09-20 | Discharge: 2020-09-20 | Disposition: A | Discharge status: Home / Self Care | Payer: BC Managed Care – PPO | Source: Ambulatory Visit | Attending: Gastroenterology | Admitting: Gastroenterology

## 2020-09-20 DIAGNOSIS — Z01812 Encounter for preprocedural laboratory examination: Secondary | ICD-10-CM | POA: Diagnosis not present

## 2020-09-20 DIAGNOSIS — Z20822 Contact with and (suspected) exposure to covid-19: Secondary | ICD-10-CM | POA: Diagnosis not present

## 2020-09-21 LAB — SARS CORONAVIRUS 2 (TAT 6-24 HRS): SARS Coronavirus 2: NEGATIVE

## 2020-09-24 ENCOUNTER — Ambulatory Visit
Admission: RE | Admit: 2020-09-24 | Discharge: 2020-09-24 | Disposition: A | Discharge status: Home / Self Care | Payer: BC Managed Care – PPO | Source: Ambulatory Visit | Attending: Gastroenterology | Admitting: Gastroenterology

## 2020-09-24 ENCOUNTER — Other Ambulatory Visit: Payer: Self-pay

## 2020-09-24 ENCOUNTER — Encounter
Admission: RE | Disposition: A | Discharge status: Home / Self Care | Payer: Self-pay | Source: Ambulatory Visit | Attending: Gastroenterology

## 2020-09-24 ENCOUNTER — Ambulatory Visit: Payer: BC Managed Care – PPO | Admitting: Anesthesiology

## 2020-09-24 DIAGNOSIS — K625 Hemorrhage of anus and rectum: Secondary | ICD-10-CM | POA: Diagnosis not present

## 2020-09-24 DIAGNOSIS — E785 Hyperlipidemia, unspecified: Secondary | ICD-10-CM | POA: Insufficient documentation

## 2020-09-24 DIAGNOSIS — Z7984 Long term (current) use of oral hypoglycemic drugs: Secondary | ICD-10-CM | POA: Insufficient documentation

## 2020-09-24 DIAGNOSIS — Z794 Long term (current) use of insulin: Secondary | ICD-10-CM | POA: Insufficient documentation

## 2020-09-24 DIAGNOSIS — E119 Type 2 diabetes mellitus without complications: Secondary | ICD-10-CM | POA: Insufficient documentation

## 2020-09-24 DIAGNOSIS — Z886 Allergy status to analgesic agent status: Secondary | ICD-10-CM | POA: Insufficient documentation

## 2020-09-24 DIAGNOSIS — I739 Peripheral vascular disease, unspecified: Secondary | ICD-10-CM | POA: Diagnosis not present

## 2020-09-24 DIAGNOSIS — K6289 Other specified diseases of anus and rectum: Secondary | ICD-10-CM | POA: Diagnosis not present

## 2020-09-24 DIAGNOSIS — K640 First degree hemorrhoids: Secondary | ICD-10-CM | POA: Insufficient documentation

## 2020-09-24 DIAGNOSIS — Z79899 Other long term (current) drug therapy: Secondary | ICD-10-CM | POA: Insufficient documentation

## 2020-09-24 DIAGNOSIS — Z7902 Long term (current) use of antithrombotics/antiplatelets: Secondary | ICD-10-CM | POA: Insufficient documentation

## 2020-09-24 DIAGNOSIS — K529 Noninfective gastroenteritis and colitis, unspecified: Secondary | ICD-10-CM | POA: Insufficient documentation

## 2020-09-24 DIAGNOSIS — Z7901 Long term (current) use of anticoagulants: Secondary | ICD-10-CM | POA: Diagnosis not present

## 2020-09-24 HISTORY — DX: Gastro-esophageal reflux disease without esophagitis: K21.9

## 2020-09-24 HISTORY — PX: COLONOSCOPY: SHX5424

## 2020-09-24 LAB — GLUCOSE, CAPILLARY: Glucose-Capillary: 91 mg/dL (ref 70–99)

## 2020-09-24 SURGERY — COLONOSCOPY
Anesthesia: General

## 2020-09-24 MED ORDER — PROPOFOL 10 MG/ML IV BOLUS
INTRAVENOUS | Status: DC | PRN
  Administered 2020-09-24: 30 mg via INTRAVENOUS
  Administered 2020-09-24: 20 mg via INTRAVENOUS

## 2020-09-24 MED ORDER — PROPOFOL 500 MG/50ML IV EMUL
INTRAVENOUS | Status: DC | PRN
  Administered 2020-09-24: 50 ug/kg/min via INTRAVENOUS

## 2020-09-24 MED ORDER — PROPOFOL 500 MG/50ML IV EMUL
INTRAVENOUS | Status: AC
  Filled 2020-09-24: qty 50

## 2020-09-24 MED ORDER — FENTANYL CITRATE (PF) 100 MCG/2ML IJ SOLN
INTRAMUSCULAR | Status: AC
  Filled 2020-09-24: qty 2

## 2020-09-24 MED ORDER — SODIUM CHLORIDE 0.9 % IV SOLN: INTRAVENOUS | Status: DC

## 2020-09-24 MED ORDER — PHENYLEPHRINE HCL (PRESSORS) 10 MG/ML IV SOLN
INTRAVENOUS | Status: AC
  Filled 2020-09-24: qty 1

## 2020-09-24 MED ORDER — MIDAZOLAM HCL 5 MG/5ML IJ SOLN
INTRAMUSCULAR | Status: DC | PRN
  Administered 2020-09-24: 2 mg via INTRAVENOUS

## 2020-09-24 MED ORDER — LIDOCAINE HCL (PF) 2 % IJ SOLN
INTRAMUSCULAR | Status: DC | PRN
  Administered 2020-09-24: 60 mg

## 2020-09-24 MED ORDER — PHENYLEPHRINE HCL (PRESSORS) 10 MG/ML IV SOLN
INTRAVENOUS | Status: DC | PRN
  Administered 2020-09-24: 100 ug via INTRAVENOUS

## 2020-09-24 MED ORDER — LIDOCAINE HCL (PF) 2 % IJ SOLN
INTRAMUSCULAR | Status: AC
  Filled 2020-09-24: qty 5

## 2020-09-24 MED ORDER — FENTANYL CITRATE (PF) 100 MCG/2ML IJ SOLN
INTRAMUSCULAR | Status: DC | PRN
  Administered 2020-09-24 (×2): 25 ug via INTRAVENOUS
  Administered 2020-09-24: 50 ug via INTRAVENOUS

## 2020-09-24 MED ORDER — MIDAZOLAM HCL 2 MG/2ML IJ SOLN
INTRAMUSCULAR | Status: AC
  Filled 2020-09-24: qty 2

## 2020-09-24 NOTE — Anesthesia Preprocedure Evaluation (Signed)
Anesthesia Evaluation  Patient identified by MRN, date of birth, ID band Patient awake    Reviewed: Allergy & Precautions, NPO status , Patient's Chart, lab work & pertinent test results  History of Anesthesia Complications Negative for: history of anesthetic complications  Airway Mallampati: II  TM Distance: >3 FB Neck ROM: Full    Dental no notable dental hx. (+) Teeth Intact   Pulmonary neg sleep apnea, neg COPD, Patient abstained from smoking.Not current smoker, former smoker,    Pulmonary exam normal breath sounds clear to auscultation       Cardiovascular Exercise Tolerance: Good METS(-) hypertension+ Peripheral Vascular Disease  (-) CAD and (-) Past MI (-) dysrhythmias  Rhythm:Regular Rate:Normal - Systolic murmurs TTE 07/2020 unremarkable   Neuro/Psych negative neurological ROS  negative psych ROS   GI/Hepatic GERD  ,(+)     (-) substance abuse  , Hepatitis -  Endo/Other  diabetes  Renal/GU negative Renal ROS     Musculoskeletal   Abdominal   Peds  Hematology   Anesthesia Other Findings Past Medical History: No date: Diabetes mellitus without complication ([facility]) No date: GERD (gastroesophageal reflux disease) No date: History of echocardiogram     Comment:  a. 07/2020 Echo: EF 60-65%, no rwma, mild LVH. Nl RV               fxn. Mildly dil LA. No date: History of tobacco abuse No date: Hyperlipidemia No date: Peripheral vascular disease ([facility])     Comment:  a. 05/2019 s/p PTA R popliteal/AT, mech thrombectomy to               R popliteal/tibioperoneal trunk--> plavix & eliquis 2.5               bid since; 09/2019 LE u/s: near nl study.  Reproductive/Obstetrics                             Anesthesia Physical Anesthesia Plan  ASA: II  Anesthesia Plan: General   Post-op Pain Management:    Induction: Intravenous  PONV Risk Score and Plan: 2 and Ondansetron, Propofol  infusion and TIVA  Airway Management Planned: Nasal Cannula  Additional Equipment: None  Intra-op Plan:   Post-operative Plan:   Informed Consent: I have reviewed the patients History and Physical, chart, labs and discussed the procedure including the risks, benefits and alternatives for the proposed anesthesia with the patient or authorized representative who has indicated his/her understanding and acceptance.     Dental advisory given  Plan Discussed with: CRNA and Surgeon  Anesthesia Plan Comments: (Discussed risks of anesthesia with patient, including possibility of difficulty with spontaneous ventilation under anesthesia necessitating airway intervention, PONV, and rare risks such as cardiac or respiratory or neurological events. Patient understands.)        Anesthesia Quick Evaluation

## 2020-09-24 NOTE — Transfer of Care (Signed)
Immediate Anesthesia Transfer of Care Note  Patient: Justin Adkins  Procedure(s) Performed: COLONOSCOPY (N/A )  Patient Location: PACU  Anesthesia Type:General  Level of Consciousness: sedated  Airway & Oxygen Therapy: Patient Spontanous Breathing  Post-op Assessment: Report given to RN and Post -op Vital signs reviewed and stable  Post vital signs: Reviewed and stable  Last Vitals:  Vitals Value Taken Time  BP    Temp    Pulse    Resp    SpO2      Last Pain:  Vitals:   09/24/20 0709  TempSrc: Temporal  PainSc: 0-No pain         Complications: No complications documented.

## 2020-09-24 NOTE — Op Note (Signed)
[facility] Patient Name: Justin Adkins Procedure Date: 09/24/2020 8:00 AM MRN: [id number] Account #: [id number] Date of Birth: 1966-04-20 Admit Type: Outpatient Age: 55 Room: [facility] ENDO ROOM 3 Gender: Male Note Status: Finalized Procedure:             Colonoscopy Indications:           Chronic diarrhea, Rectal bleeding Providers:             Andrey Farmer MD, MD Referring MD:          [facility] (Referring MD) Medicines:             Monitored Anesthesia Care Complications:         No immediate complications. Estimated blood loss:                         Minimal. Procedure:             Pre-Anesthesia Assessment:                        - Prior to the procedure, a History and Physical was                         performed, and patient medications and allergies were                         reviewed. The patient is competent. The risks and                         benefits of the procedure and the sedation options and                         risks were discussed with the patient. All questions                         were answered and informed consent was obtained.                         Patient identification and proposed procedure were                         verified by the physician, the nurse, the anesthetist                         and the technician in the endoscopy suite. Mental                         Status Examination: alert and oriented. Airway                         Examination: normal oropharyngeal airway and neck                         mobility. Respiratory Examination: clear to                         auscultation. CV Examination: normal. Prophylactic  Antibiotics: The patient does not require prophylactic                         antibiotics. Prior Anticoagulants: The patient has                         taken Plavix (clopidogrel), last dose was 7 days prior                         to procedure.  ASA Grade Assessment: III - A patient                         with severe systemic disease. After reviewing the                         risks and benefits, the patient was deemed in                         satisfactory condition to undergo the procedure. The                         anesthesia plan was to use monitored anesthesia care                         (MAC). Immediately prior to administration of                         medications, the patient was re-assessed for adequacy                         to receive sedatives. The heart rate, respiratory                         rate, oxygen saturations, blood pressure, adequacy of                         pulmonary ventilation, and response to care were                         monitored throughout the procedure. The physical                         status of the patient was re-assessed after the                         procedure.                        After obtaining informed consent, the colonoscope was                         passed under direct vision. Throughout the procedure,                         the patient's blood pressure, pulse, and oxygen                         saturations were monitored continuously. The  Colonoscope was introduced through the anus and                         advanced to the the terminal ileum. The colonoscopy                         was performed without difficulty. The patient                         tolerated the procedure well. The quality of the bowel                         preparation was fair. Findings:      The perianal and digital rectal examinations were normal.      The terminal ileum appeared normal.      Diffuse moderate inflammation characterized by congestion (edema),       erosions, erythema, friability and mucus was found in the rectum.       Biopsies were taken with a cold forceps for histology. Estimated blood       loss was minimal.      Non-bleeding internal  hemorrhoids were found during retroflexion. The       hemorrhoids were Grade I (internal hemorrhoids that do not prolapse).      The exam was otherwise without abnormality on direct and retroflexion       views. Impression:            - Preparation of the colon was fair.                        - The examined portion of the ileum was normal.                        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                         Biopsied.                        - Non-bleeding internal hemorrhoids.                        - The examination was otherwise normal on direct and                         retroflexion views. Recommendation:        - Discharge patient to home.                        - Resume previous diet.                        - Continue present medications.                        - Resume Plavix (clopidogrel) at prior dose tomorrow.                        -  Await pathology results.                        - Return to referring physician as previously                         scheduled. 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 All rights reserved. The codes documented in this report are preliminary and upon coder review may  be revised to meet current compliance requirements. Andrey Farmer MD, MD 09/24/2020 8:37:27 AM Number of Addenda: 0 Note Initiated On: 09/24/2020 8:00  AM Scope Withdrawal Time: 0 hours 12 minutes 9 seconds  Total Procedure Duration: 0 hours 23 minutes 57 seconds  Estimated Blood Loss:  Estimated blood loss was minimal.      [facility]

## 2020-09-24 NOTE — H&P (Signed)
Outpatient short stay form Pre-procedure 09/24/2020 7:59 AM Merlyn Lot MD, MPH  Primary Physician: [facility]  Reason for visit:  Rectal Bleeding  History of present illness:   55 y/o gentleman with history of PAD, DM II, and HLD here for colonoscopy due to rectal bleeding and elevated fecal calprotectin. Had hospitalization a few weeks ago with elevated AST/ALT of unknown cause. No abdominal surgeries, no family history of GI malignancies. Takes plavix which he last took one week ago.    Current Facility-Administered Medications:  .  0.9 %  sodium chloride infusion, , Intravenous, Continuous, Avigayil Ton, Rossie Muskrat, MD, Last Rate: 20 mL/hr at 09/24/20 0723, New Bag at 09/24/20 0723  Medications Prior to Admission  Medication Sig Dispense Refill Last Dose  . apixaban (ELIQUIS) 2.5 MG TABS tablet Take 2.5 mg by mouth 2 (two) times daily.   09/17/2020 at Unknown time  . atorvastatin (LIPITOR) 20 MG tablet Take 1 tablet (20 mg total) by mouth daily. 30 tablet 5 Past Week at Unknown time  . clopidogrel (PLAVIX) 75 MG tablet TAKE 1 TABLET BY MOUTH EVERY DAY 90 tablet 3 09/17/2020 at Unknown time  . docusate sodium (COLACE) 100 MG capsule Take 100 mg by mouth 2 (two) times daily as needed for mild constipation.   Past Week at Unknown time  . ergocalciferol (VITAMIN D2) 1.25 MG (50000 UT) capsule Take 50,000 Units by mouth once a week.   Past Week at Unknown time  . FARXIGA 10 MG TABS tablet Take 10 mg by mouth daily.   Past Week at Unknown time  . NOVOLIN N 100 UNIT/ML injection Inject 0-30 Units into the skin 2 (two) times daily before a meal.    09/23/2020 at 0800  . OZEMPIC, 0.25 OR 0.5 MG/DOSE, 2 MG/1.5ML SOPN Inject 0.5 mg into the skin once a week.   Past Week at Unknown time  . pantoprazole (PROTONIX) 40 MG tablet Take 40 mg by mouth 2 (two) times daily.   Past Week at Unknown time  . rivaroxaban (XARELTO) 2.5 MG TABS tablet Take 1 tablet (2.5 mg total) by mouth 2 (two) times daily.  60 tablet 6 09/17/2020 at Unknown time  . metoprolol tartrate (LOPRESSOR) 100 MG tablet Take 1 tablet (100 mg total) by mouth once for 1 dose. Take 2 hours prior to your CT scan. 1 tablet 0      Allergies  Allergen Reactions  . Naproxen Anaphylaxis  . Liraglutide Nausea Only     Past Medical History:  Diagnosis Date  . Diabetes mellitus without complication (HCC)   . GERD (gastroesophageal reflux disease)   . History of echocardiogram    a. 07/2020 Echo: EF 60-65%, no rwma, mild LVH. Nl RV fxn. Mildly dil LA.  Marland Kitchen History of tobacco abuse   . Hyperlipidemia   . Peripheral vascular disease (HCC)    a. 05/2019 s/p PTA R popliteal/AT, mech thrombectomy to R popliteal/tibioperoneal trunk--> plavix & eliquis 2.5 bid since; 09/2019 LE u/s: near nl study.    Review of systems:  Otherwise negative.    Physical Exam  Gen: Alert, oriented. Appears stated age.  HEENT: PERRLA. Lungs: No respiratory distress CV: RRR Abd: soft, benign, no masses Ext: No edema    Planned procedures: Proceed with colonoscopy. The patient understands the nature of the planned procedure, indications, risks, alternatives and potential complications including but not limited to bleeding, infection, perforation, damage to internal organs and possible oversedation/side effects from anesthesia. The patient agrees and gives  consent to proceed.  Please refer to procedure notes for findings, recommendations and patient disposition/instructions.     Merlyn Lot MD, MPH Gastroenterology 09/24/2020  7:59 AM

## 2020-09-24 NOTE — Interval H&P Note (Signed)
History and Physical Interval Note:  09/24/2020 8:02 AM  Justin Adkins  has presented today for surgery, with the diagnosis of diarrhea, rectal bleeding.  The various methods of treatment have been discussed with the patient and family. After consideration of risks, benefits and other options for treatment, the patient has consented to  Procedure(s): COLONOSCOPY (N/A) as a surgical intervention.  The patient's history has been reviewed, patient examined, no change in status, stable for surgery.  I have reviewed the patient's chart and labs.  Questions were answered to the patient's satisfaction.     Regis Bill  Ok to proceed with colonoscopy

## 2020-09-24 NOTE — Anesthesia Postprocedure Evaluation (Signed)
Anesthesia Post Note  Patient: Justin Adkins  Procedure(s) Performed: COLONOSCOPY (N/A )  Patient location during evaluation: Endoscopy Anesthesia Type: General Level of consciousness: awake and alert Pain management: pain level controlled Vital Signs Assessment: post-procedure vital signs reviewed and stable Respiratory status: spontaneous breathing, nonlabored ventilation, respiratory function stable and patient connected to nasal cannula oxygen Cardiovascular status: blood pressure returned to baseline and stable Postop Assessment: no apparent nausea or vomiting Anesthetic complications: no   No complications documented.   Last Vitals:  Vitals:   09/24/20 0835 09/24/20 0845  BP: 103/68 96/63  Pulse: 72 72  Resp: 14 15  Temp: (!) 35.8 C   SpO2: 96% 97%    Last Pain:  Vitals:   09/24/20 0845  TempSrc:   PainSc: 0-No pain                 Corinda Gubler

## 2020-09-25 ENCOUNTER — Encounter: Payer: Self-pay | Admitting: Gastroenterology

## 2020-09-25 ENCOUNTER — Telehealth (HOSPITAL_COMMUNITY): Payer: Self-pay | Admitting: Emergency Medicine

## 2020-09-25 LAB — SURGICAL PATHOLOGY

## 2020-09-25 NOTE — Telephone Encounter (Signed)
Reaching out to patient to offer assistance regarding upcoming cardiac imaging study; pt verbalizes understanding of appt date/time, parking situation and where to check in, pre-test NPO status and medications ordered, and verified current allergies; name and call back number provided for further questions should they arise Rockwell Alexandria RN Navigator Cardiac Imaging [facility] [phone] office [phone] cell  Pt to take 100mg  metoprolol tartrate 2hr PTA

## 2020-09-27 ENCOUNTER — Ambulatory Visit
Admission: RE | Admit: 2020-09-27 | Discharge: 2020-09-27 | Disposition: A | Discharge status: Home / Self Care | Payer: BC Managed Care – PPO | Source: Ambulatory Visit | Attending: Cardiology | Admitting: Cardiology

## 2020-09-27 ENCOUNTER — Other Ambulatory Visit (HOSPITAL_COMMUNITY): Payer: Self-pay | Admitting: Emergency Medicine

## 2020-09-27 ENCOUNTER — Other Ambulatory Visit: Payer: Self-pay | Admitting: Cardiology

## 2020-09-27 ENCOUNTER — Other Ambulatory Visit: Payer: Self-pay

## 2020-09-27 DIAGNOSIS — R072 Precordial pain: Secondary | ICD-10-CM

## 2020-09-27 MED ORDER — IOHEXOL 350 MG/ML SOLN
75.0000 mL | Freq: Once | INTRAVENOUS | Status: AC | PRN
  Administered 2020-09-27: 75 mL via INTRAVENOUS

## 2020-09-27 MED ORDER — IVABRADINE HCL 5 MG PO TABS: 15.0000 mg | ORAL_TABLET | Freq: Once | ORAL | 0 refills | Status: AC

## 2020-09-27 MED ORDER — NITROGLYCERIN 0.4 MG SL SUBL
0.8000 mg | SUBLINGUAL_TABLET | Freq: Once | SUBLINGUAL | Status: AC
  Administered 2020-09-27: 0.8 mg via SUBLINGUAL

## 2020-09-27 MED ORDER — METOPROLOL TARTRATE 5 MG/5ML IV SOLN: 10.0000 mg | Freq: Once | INTRAVENOUS | Status: DC

## 2020-09-27 MED ORDER — METOPROLOL TARTRATE 5 MG/5ML IV SOLN
10.0000 mg | Freq: Once | INTRAVENOUS | Status: AC
  Administered 2020-09-27: 10 mg via INTRAVENOUS

## 2020-09-27 NOTE — Progress Notes (Signed)
Unable to complete scan due to BP. Dr. Azucena Cecil notified. Ambulate w/o difficulty to chair, drinking water provided. Explained reasons we couldn't complete exam and what comes next. Verbalized understanding. All questions answered. ABC intact. No further needs. Discharge from procedure area w/o issues.

## 2020-09-27 NOTE — Progress Notes (Signed)
Pt to be rescheduled for cardiac CTA at [facility] on 2/17 with new pre-medications for HR control.   15mg  ivabradine prescribed and sent to his pharmacy on file.   ivabradine to be taken 2 hr PTA.  Will call patient to confirm.  New order for CCTA placed.  RN Navigator Cardiac Imaging [facility] [phone] Office  [phone] Cell

## 2020-10-19 ENCOUNTER — Telehealth (INDEPENDENT_AMBULATORY_CARE_PROVIDER_SITE_OTHER): Payer: Self-pay

## 2020-10-19 NOTE — Telephone Encounter (Signed)
error 

## 2020-10-19 NOTE — Telephone Encounter (Signed)
I called the pt and left a VM  making him a aware of the Np's instructions.

## 2020-10-19 NOTE — Telephone Encounter (Signed)
Pt called and left a VM on the nurses line saying that he is on Xarelto and Plavix and per other provider that he has seen this is too much he would like to know which should he continue taking the pt was last seen 09/26/19 with Dr. Gilda Crease per the last note  The pt was post successful angio intervention. 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

## 2020-10-19 NOTE — Telephone Encounter (Signed)
In some instances we purposely place patients on eliquis and plavix, expecially if we had to do extensive intervention and remove a lot of clot.  For providers that don't do vascular surgery, this can seem like you are on too much blood thinner if that isn't what they are use to treating.  For now, we can try holding the eliquis and seeing how he feels.  If he begins to have leg pain he should contact our office.

## 2020-10-22 ENCOUNTER — Telehealth: Payer: Self-pay | Admitting: Cardiology

## 2020-10-22 NOTE — Telephone Encounter (Signed)
Patient already had a portion ot the CCTA done as he is self pay. Will reach out to scheduling as to reason other two parts were not scheduled.

## 2020-10-22 NOTE — Telephone Encounter (Signed)
Called patient to remind him of an appointment  Patient has never heard from scheduling to have CT  Patient not sure he wants to reschedule  Please call to discuss

## 2020-10-23 ENCOUNTER — Ambulatory Visit: Payer: BC Managed Care – PPO | Admitting: Cardiology

## 2020-10-23 ENCOUNTER — Other Ambulatory Visit (HOSPITAL_COMMUNITY): Payer: Self-pay | Admitting: *Deleted

## 2020-10-23 MED ORDER — METOPROLOL TARTRATE 100 MG PO TABS: 100.0000 mg | ORAL_TABLET | Freq: Once | ORAL | 0 refills | Status: DC

## 2020-10-23 NOTE — Progress Notes (Deleted)
Sending in a prescription for a one time dose of 100mg  metoprolol tartrate for pt to take 2 hours prior to Cardiac CT per Dr. .

## 2020-10-25 ENCOUNTER — Ambulatory Visit: Payer: BC Managed Care – PPO

## 2020-10-31 ENCOUNTER — Telehealth (HOSPITAL_COMMUNITY): Payer: Self-pay | Admitting: Emergency Medicine

## 2020-10-31 NOTE — Telephone Encounter (Signed)
Patient was given Corlanor 5 mg tablets, instructed to take  5 mg (3) tablets to equal 15 mg that was instructed for him to take 2 hrs prior to the cardiac imaging study. Corlanor 5 mg 1 bottle  Lot # [id number] Exp. 02/2024. Patient picked up samples on 10/31/2020 at 12:10 pm.  The patient is aware of how to take the Corlanor.

## 2020-10-31 NOTE — Telephone Encounter (Signed)
Spoke with Huntley Dec at [facility].  Patient needs to come to office today to pick up pre procedure sample of corlanor.    Patient aware to stop by the front desk.

## 2020-10-31 NOTE — Telephone Encounter (Signed)
Reaching out to patient to offer assistance regarding upcoming cardiac imaging study; pt verbalizes understanding of appt date/time, parking situation and where to check in, pre-test NPO status and medications ordered, and verified current allergies; name and call back number provided for further questions should they arise Justin Alexandria RN Navigator Cardiac Imaging [facility] [phone] office [phone] cell  Pt to take 100mg  metoprolol tartrate + 15mg  ivabradine 2 hr prior to scan Pt states he never got rx for ivabradine. His pharmacy never called to tell me or the patient that the medication was not available to fill. I will attempt to get the patient a sample from [facility].  Pt verbalized understanding 

## 2020-11-01 ENCOUNTER — Ambulatory Visit
Admission: RE | Admit: 2020-11-01 | Discharge: 2020-11-01 | Disposition: A | Discharge status: Home / Self Care | Payer: BC Managed Care – PPO | Source: Ambulatory Visit | Attending: Cardiology | Admitting: Cardiology

## 2020-11-01 ENCOUNTER — Other Ambulatory Visit: Payer: Self-pay

## 2020-11-01 DIAGNOSIS — R072 Precordial pain: Secondary | ICD-10-CM

## 2020-11-01 MED ORDER — DILTIAZEM HCL 25 MG/5ML IV SOLN
10.0000 mg | Freq: Once | INTRAVENOUS | Status: AC
  Administered 2020-11-01: 10 mg via INTRAVENOUS

## 2020-11-01 MED ORDER — IOHEXOL 350 MG/ML SOLN
75.0000 mL | Freq: Once | INTRAVENOUS | Status: AC | PRN
  Administered 2020-11-01: 75 mL via INTRAVENOUS

## 2020-11-01 MED ORDER — NITROGLYCERIN 0.4 MG SL SUBL
0.8000 mg | SUBLINGUAL_TABLET | Freq: Once | SUBLINGUAL | Status: AC
  Administered 2020-11-01: 0.8 mg via SUBLINGUAL

## 2020-11-01 NOTE — Progress Notes (Signed)
Patient tolerated CT well. Drank water and ate peanut butter crackers after. Vital signs stable encourage to drink water throughout day.Reasons explained and verbalized understanding. Ambulated steady gait.  

## 2020-11-01 NOTE — Progress Notes (Signed)
VS correction from 09/27/20 at 0916, 0926, and 0933no SpO2 taken.  Pulse rate placed in wrong spot. Pulse rate at 0916 was 71, at 0926 pulse rate was 72, and pulse rate was 74 at 0933.

## 2020-11-02 ENCOUNTER — Telehealth: Payer: Self-pay | Admitting: *Deleted

## 2020-11-02 NOTE — Telephone Encounter (Signed)
Left voicemail message to call back for review of results.  

## 2020-11-02 NOTE — Telephone Encounter (Signed)
-----   Message from Debbe Odea, MD sent at 11/01/2020  3:48 PM EDT ----- Normal coronary CTA, no evidence for CAD.

## 2020-11-05 NOTE — Telephone Encounter (Signed)
See telephone encounter. Results reviewed with patient and he did request to schedule follow up appointment with provider. Scheduled follow up that worked with his schedule and confirmed date and time. He was appreciative for the review with no further questions at this time.

## 2020-11-14 ENCOUNTER — Other Ambulatory Visit: Payer: Self-pay | Admitting: Podiatry

## 2020-11-14 DIAGNOSIS — M79671 Pain in right foot: Secondary | ICD-10-CM

## 2020-11-18 ENCOUNTER — Ambulatory Visit
Admission: RE | Admit: 2020-11-18 | Discharge: 2020-11-18 | Disposition: A | Discharge status: Home / Self Care | Payer: BC Managed Care – PPO | Source: Ambulatory Visit | Attending: Podiatry | Admitting: Podiatry

## 2020-11-18 ENCOUNTER — Other Ambulatory Visit: Payer: Self-pay

## 2020-11-18 DIAGNOSIS — M79671 Pain in right foot: Secondary | ICD-10-CM | POA: Diagnosis present

## 2020-11-29 ENCOUNTER — Ambulatory Visit: Payer: BC Managed Care – PPO | Admitting: Cardiology

## 2020-11-30 ENCOUNTER — Encounter: Payer: Self-pay | Admitting: Cardiology

## 2020-12-06 ENCOUNTER — Other Ambulatory Visit: Payer: Self-pay

## 2020-12-06 ENCOUNTER — Ambulatory Visit (INDEPENDENT_AMBULATORY_CARE_PROVIDER_SITE_OTHER): Payer: BC Managed Care – PPO | Admitting: Nurse Practitioner

## 2020-12-06 ENCOUNTER — Ambulatory Visit (INDEPENDENT_AMBULATORY_CARE_PROVIDER_SITE_OTHER): Payer: BC Managed Care – PPO

## 2020-12-06 ENCOUNTER — Encounter (INDEPENDENT_AMBULATORY_CARE_PROVIDER_SITE_OTHER): Payer: Self-pay | Admitting: Nurse Practitioner

## 2020-12-06 VITALS — BP 126/79 | HR 72 | Ht 69.0 in | Wt 188.0 lb

## 2020-12-06 DIAGNOSIS — E1142 Type 2 diabetes mellitus with diabetic polyneuropathy: Secondary | ICD-10-CM

## 2020-12-06 DIAGNOSIS — I70221 Atherosclerosis of native arteries of extremities with rest pain, right leg: Secondary | ICD-10-CM | POA: Diagnosis not present

## 2020-12-06 DIAGNOSIS — M79671 Pain in right foot: Secondary | ICD-10-CM | POA: Diagnosis not present

## 2020-12-09 ENCOUNTER — Encounter (INDEPENDENT_AMBULATORY_CARE_PROVIDER_SITE_OTHER): Payer: Self-pay | Admitting: Nurse Practitioner

## 2020-12-09 NOTE — Progress Notes (Signed)
Subjective:    Patient ID: Justin Adkins, male    DOB: 1966-05-04, 55 y.o.   MRN: [id number] Chief Complaint  Patient presents with  . Follow-up    U/S    The patient presents today for evaluation of foot pain.  The patient notes that this right foot pain has been going on for years.  It happens to happen right beneath his toes.  He feels as if he is walking on marbles.  He also has developed mobility problems with his foot.  The patient cannot move or wiggle his toes very much in comparison to his left foot.  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  He also notes that the pain the patient had in his foot was also present prior to and after his angiogram in 2020.  He notes that there was no difference in the pain quality.  Recent evaluation by podiatry notes a possible recurrence of a neuroma as well as some other neurological changes in his right foot.  He denies any claudication-like symptoms or rest pain.  He denies any wounds or ulcerations.  Today noninvasive studies show an ABI of 0.93 on the right and 1.06 on the left.  The patient has triphasic tibial artery waveforms bilaterally with good toe waveforms.  Right lower extremity shows triphasic waveforms throughout however there is noted 75 to 99% stenosis in the popliteal artery   Review of Systems  Neurological: Positive for numbness.  All other systems reviewed and are negative.      Objective:   Physical Exam Vitals reviewed.  HENT:     Head: Normocephalic.  Cardiovascular:     Pulses:          Dorsalis pedis pulses are 1+ on the right side and 1+ on the left side.       Posterior tibial pulses are 1+ on the right side and 1+ on the left side.  Pulmonary:     Effort: Pulmonary effort is normal.  Neurological:     Mental Status: He is alert and oriented to person, place, and time.  Psychiatric:        Mood and Affect: Mood normal.        Behavior: Behavior normal.         Thought Content: Thought content normal.        Judgment: Judgment normal.     BP 126/79   Pulse 72   Ht 5\' 9"  (1.753 m)   Wt 188 lb (85.3 kg)   BMI 27.76 kg/m   Past Medical History:  Diagnosis Date  . Diabetes mellitus without complication (HCC)   . GERD (gastroesophageal reflux disease)   . History of echocardiogram    a. 07/2020 Echo: EF 60-65%, no rwma, mild LVH. Nl RV fxn. Mildly dil LA.  08/2020 History of tobacco abuse   . Hyperlipidemia   . Peripheral vascular disease (HCC)    a. 05/2019 s/p PTA R popliteal/AT, mech thrombectomy to R popliteal/tibioperoneal trunk--> plavix & eliquis 2.5 bid since; 09/2019 LE u/s: near nl study.    Social History   Socioeconomic History  . Marital status: Married    Spouse name: Not on file  . Number of children: Not on file  . Years of education: Not on file  . Highest education level: Not on file  Occupational History  . Not on file  Tobacco Use  . Smoking status: Former Smoker  Packs/day: 1.00    Years: 35.00    Pack years: 35.00  . Smokeless tobacco: Never Used  Vaping Use  . Vaping Use: Never used  Substance and Sexual Activity  . Alcohol use: Yes    Comment: rarely  . Drug use: No  . Sexual activity: Yes    Birth control/protection: None  Other Topics Concern  . Not on file  Social History Narrative   Lives in [place] w/ wife and son.  Does not routinely exercise.   Social Determinants of Health   Financial Resource Strain: Not on file  Food Insecurity: Not on file  Transportation Needs: Not on file  Physical Activity: Not on file  Stress: Not on file  Social Connections: Not on file  Intimate Partner Violence: Not on file    Past Surgical History:  Procedure Laterality Date  . COLONOSCOPY    . COLONOSCOPY N/A 09/24/2020   Procedure: COLONOSCOPY;  Surgeon: Regis Bill, MD;  Location: [facility] ENDOSCOPY;  Service: Endoscopy;  Laterality: N/A;  . ESOPHAGOGASTRODUODENOSCOPY (EGD) WITH PROPOFOL N/A  02/28/2020   Procedure: ESOPHAGOGASTRODUODENOSCOPY (EGD) WITH PROPOFOL;  Surgeon: Regis Bill, MD;  Location: [facility] ENDOSCOPY;  Service: Endoscopy;  Laterality: N/A;  . FOOT SURGERY Right   . HERNIA REPAIR    . LOWER EXTREMITY ANGIOGRAPHY Right 05/25/2019   Procedure: LOWER EXTREMITY ANGIOGRAPHY;  Surgeon: Renford Dills, MD;  Location: [facility] INVASIVE CV LAB;  Service: Cardiovascular;  Laterality: Right;  . TONSILLECTOMY    . VASCULAR SURGERY      Family History  Problem Relation Age of Onset  . Cancer Father   . Heart attack Brother        MI @ 12    Allergies  Allergen Reactions  . Naproxen Anaphylaxis  . Liraglutide Nausea Only    CBC Latest Ref Rng & Units 07/27/2020 07/26/2020 07/25/2020  WBC 4.0 - 10.5 K/uL 4.8 4.3 -  Hemoglobin 13.0 - 17.0 g/dL 23.7 62.8 31.5  Hematocrit 39.0 - 52.0 % 39.5 40.3 42.0  Platelets 150 - 400 K/uL 186 181 -      CMP     Component Value Date/Time   NA 141 09/10/2020 1124   K 4.5 09/10/2020 1124   CL 102 09/10/2020 1124   CO2 21 09/10/2020 1124   GLUCOSE 159 (H) 09/10/2020 1124   GLUCOSE 148 (H) 07/27/2020 0634   BUN 13 09/10/2020 1124   CREATININE 0.97 09/10/2020 1124   CALCIUM 9.6 09/10/2020 1124   PROT 6.3 (L) 07/27/2020 0634   ALBUMIN 3.3 (L) 07/27/2020 0634   AST 46 (H) 07/27/2020 0634   ALT 313 (H) 07/27/2020 0634   ALKPHOS 169 (H) 07/27/2020 0634   BILITOT 0.8 07/27/2020 0634   GFRNONAA 88 09/10/2020 1124   GFRNONAA >60 07/27/2020 0634   GFRAA 102 09/10/2020 1124     VAS [place] ABI WITH/WO TBI  Result Date: 12/06/2020 LOWER EXTREMITY DOPPLER STUDY  Vascular Interventions: 05/25/2019 PTA of right popliteal artery. PTA of right                         anterior tibial artery. TPA of right popliteal artery.                         Thrombectomy of right tibioperoneal trunk and proximal  peroneal artery. Comparison Study: 09/26/2019 Performing Technologist: Reece Agar RT (R)(VS)  Examination  Guidelines: A complete evaluation includes at minimum, Doppler waveform signals and systolic blood pressure reading at the level of bilateral brachial, anterior tibial, and posterior tibial arteries, when vessel segments are accessible. Bilateral testing is considered an integral part of a complete examination. Photoelectric Plethysmograph (PPG) waveforms and toe systolic pressure readings are included as required and additional duplex testing as needed. Limited examinations for reoccurring indications may be performed as noted.  ABI Findings: +---------+------------------+-----+---------+--------+ Right    Rt Pressure (mmHg)IndexWaveform Comment  +---------+------------------+-----+---------+--------+ Brachial 131                                      +---------+------------------+-----+---------+--------+ ATA      108               0.80 triphasic         +---------+------------------+-----+---------+--------+ PTA      125               0.93 triphasic         +---------+------------------+-----+---------+--------+ Great Toe92                0.68 Normal            +---------+------------------+-----+---------+--------+ +---------+------------------+-----+---------+-------+ Left     Lt Pressure (mmHg)IndexWaveform Comment +---------+------------------+-----+---------+-------+ Brachial 135                                     +---------+------------------+-----+---------+-------+ ATA      143               1.06 triphasic        +---------+------------------+-----+---------+-------+ PTA      139               1.03 triphasic        +---------+------------------+-----+---------+-------+ Great Toe98                0.73 Normal           +---------+------------------+-----+---------+-------+ +-------+-----------+-----------+------------+------------+ ABI/TBIToday's ABIToday's TBIPrevious ABIPrevious TBI  +-------+-----------+-----------+------------+------------+ Right  .93        .68        .93         .85          +-------+-----------+-----------+------------+------------+ Left   1.06       .73        1.24        .90          +-------+-----------+-----------+------------+------------+ Bilateral ABIs appear essentially unchanged compared to prior study on 09/26/2019.  Summary: Right: Resting right ankle-brachial index indicates mild right lower extremity arterial disease. The right toe-brachial index is abnormal. Right TBI appears slightly decreased as compared to the previous exam on 09/26/2019. Left: Resting left ankle-brachial index is within normal range. No evidence of significant left lower extremity arterial disease. The left toe-brachial index is normal. Left TBI appears essentially unchanged as compared to the previous exam on 09/26/2019.  *See table(s) above for measurements and observations.  Electronically signed by Levora Dredge MD on 12/06/2020 at 5:09:24 PM.    Final        Assessment & Plan:   1. Right foot pain Based on noninvasive studies as well as recent foot MRI, this is likely related to neuroma or other neurological  changes.  2. Atherosclerosis of native artery of right lower extremity with rest pain [facility](HCC) Although patient does have known PAD as well as some stenosis within the right popliteal artery, he does not have any other symptoms of peripheral arterial disease including claudication or rest pain.  Also based on the patient's description of his symptoms, even following intervention, the pain in his right foot persisted and changed very little.  Based on this I agree with podiatry's assessment that this is likely related to a neuroma with some possible nerve dysfunction.  We will continue to follow the patient on a regular basis due to his known peripheral arterial disease and stenosis.  The patient is advised that if he begins to experience claudication-like symptoms,  rest pain or wounds or ulcerations he should contact her office and let us know soon as possible we will have him follow-up sooner.  Otherwise patient will follow-up with noninvasive studies in 6 months.  3. Type 2 diabetes mellitus with peripheral neuropathy (HCC) Continue hypoglycemic medications as already ordered, these medications have been reviewed and there are no changes at this time.  Hgb A1C to be monitored as already arranged by primary service    Current Outpatient Medications on File Prior to Visit  Medication Sig Dispense Refill  . apixaban (ELIQUIS) 2.5 MG TABS tablet Take 2.5 mg by mouth 2 (two) times daily.    Marland Kitchen. atorvastatin (LIPITOR) 20 MG tablet Take 1 tablet (20 mg total) by mouth daily. 30 tablet 5  . clopidogrel (PLAVIX) 75 MG tablet TAKE 1 TABLET BY MOUTH EVERY DAY 90 tablet 3  . docusate sodium (COLACE) 100 MG capsule Take 100 mg by mouth 2 (two) times daily as needed for mild constipation.    . ergocalciferol (VITAMIN D2) 1.25 MG (50000 UT) capsule Take 50,000 Units by mouth once a week.    Marland Kitchen. FARXIGA 10 MG TABS tablet Take 10 mg by mouth daily.    . mesalamine (LIALDA) 1.2 g EC tablet Take by mouth.    Marland Kitchen. NOVOLIN N 100 UNIT/ML injection Inject 0-30 Units into the skin 2 (two) times daily before a meal.     . OZEMPIC, 0.25 OR 0.5 MG/DOSE, 2 MG/1.5ML SOPN Inject 0.5 mg into the skin once a week.    . pantoprazole (PROTONIX) 40 MG tablet Take 40 mg by mouth 2 (two) times daily.    . rivaroxaban (XARELTO) 2.5 MG TABS tablet Take 1 tablet (2.5 mg total) by mouth 2 (two) times daily. 60 tablet 6  . metoprolol tartrate (LOPRESSOR) 100 MG tablet Take 1 tablet (100 mg total) by mouth once for 1 dose. Take 2 hours prior to your CT scan. 1 tablet 0   No current facility-administered medications on file prior to visit.    There are no Patient Instructions on file for this visit. No follow-ups on file.   Georgiana SpinnerFallon E Romey Mathieson, NP

## 2020-12-09 NOTE — Progress Notes (Incomplete)
Subjective:    Patient ID: Justin Adkins, male    DOB: [birth year], 55 y.o.   MRN: [id number] Chief Complaint  Patient presents with  . Follow-up    U/S    HPI  Review of Systems     Objective:   Physical Exam  BP 126/79   Pulse 72   Ht 5\' 9"  (1.753 m)   Wt 188 lb (85.3 kg)   BMI 27.76 kg/m   Past Medical History:  Diagnosis Date  . Diabetes mellitus without complication (HCC)   . GERD (gastroesophageal reflux disease)   . History of echocardiogram    a. 07/2020 Echo: EF 60-65%, no rwma, mild LVH. Nl RV fxn. Mildly dil LA.  08/2020 History of tobacco abuse   . Hyperlipidemia   . Peripheral vascular disease (HCC)    a. 05/2019 s/p PTA R popliteal/AT, mech thrombectomy to R popliteal/tibioperoneal trunk--> plavix & eliquis 2.5 bid since; 09/2019 LE u/s: near nl study.    Social History   Socioeconomic History  . Marital status: Married    Spouse name: Not on file  . Number of children: Not on file  . Years of education: Not on file  . Highest education level: Not on file  Occupational History  . Not on file  Tobacco Use  . Smoking status: Former Smoker    Packs/day: 1.00    Years: 35.00    Pack years: 35.00  . Smokeless tobacco: Never Used  Vaping Use  . Vaping Use: Never used  Substance and Sexual Activity  . Alcohol use: Yes    Comment: rarely  . Drug use: No  . Sexual activity: Yes    Birth control/protection: None  Other Topics Concern  . Not on file  Social History Narrative   Lives in [place] w/ wife and son.  Does not routinely exercise.   Social Determinants of Health   Financial Resource Strain: Not on file  Food Insecurity: Not on file  Transportation Needs: Not on file  Physical Activity: Not on file  Stress: Not on file  Social Connections: Not on file  Intimate Partner Violence: Not on file    Past Surgical History:  Procedure Laterality Date  . COLONOSCOPY    . COLONOSCOPY N/A 09/24/2020   Procedure: COLONOSCOPY;  Surgeon: 11/22/2020, MD;  Location: [facility] ENDOSCOPY;  Service: Endoscopy;  Laterality: N/A;  . ESOPHAGOGASTRODUODENOSCOPY (EGD) WITH PROPOFOL N/A 02/28/2020   Procedure: ESOPHAGOGASTRODUODENOSCOPY (EGD) WITH PROPOFOL;  Surgeon: 03/01/2020, MD;  Location: [facility] ENDOSCOPY;  Service: Endoscopy;  Laterality: N/A;  . FOOT SURGERY Right   . HERNIA REPAIR    . LOWER EXTREMITY ANGIOGRAPHY Right 05/25/2019   Procedure: LOWER EXTREMITY ANGIOGRAPHY;  Surgeon: 07/25/2019, MD;  Location: [facility] INVASIVE CV LAB;  Service: Cardiovascular;  Laterality: Right;  . TONSILLECTOMY    . VASCULAR SURGERY      Family History  Problem Relation Age of Onset  . Cancer Father   . Heart attack Brother        MI @ 68    Allergies  Allergen Reactions  . Naproxen Anaphylaxis  . Liraglutide Nausea Only    CBC Latest Ref Rng & Units 07/27/2020 07/26/2020 07/25/2020  WBC 4.0 - 10.5 K/uL 4.8 4.3 -  Hemoglobin 13.0 - 17.0 g/dL 14/03/2020 10.9 32.3  Hematocrit 39.0 - 52.0 % 39.5 40.3 42.0  Platelets 150 - 400 K/uL 186 181 -      CMP  Component Value Date/Time   NA 141 09/10/2020 1124   K 4.5 09/10/2020 1124   CL 102 09/10/2020 1124   CO2 21 09/10/2020 1124   GLUCOSE 159 (H) 09/10/2020 1124   GLUCOSE 148 (H) 07/27/2020 0634   BUN 13 09/10/2020 1124   CREATININE 0.97 09/10/2020 1124   CALCIUM 9.6 09/10/2020 1124   PROT 6.3 (L) 07/27/2020 0634   ALBUMIN 3.3 (L) 07/27/2020 0634   AST 46 (H) 07/27/2020 0634   ALT 313 (H) 07/27/2020 0634   ALKPHOS 169 (H) 07/27/2020 0634   BILITOT 0.8 07/27/2020 0634   GFRNONAA 88 09/10/2020 1124   GFRNONAA >60 07/27/2020 0634   GFRAA 102 09/10/2020 1124     VAS [place] ABI WITH/WO TBI  Result Date: 12/06/2020 LOWER EXTREMITY DOPPLER STUDY  Vascular Interventions: 05/25/2019 PTA of right popliteal artery. PTA of right                         anterior tibial artery. TPA of right popliteal artery.                         Thrombectomy of right tibioperoneal trunk and proximal                          peroneal artery. Comparison Study: 09/26/2019 Performing Technologist: Reece Agar RT (R)(VS)  Examination Guidelines: A complete evaluation includes at minimum, Doppler waveform signals and systolic blood pressure reading at the level of bilateral brachial, anterior tibial, and posterior tibial arteries, when vessel segments are accessible. Bilateral testing is considered an integral part of a complete examination. Photoelectric Plethysmograph (PPG) waveforms and toe systolic pressure readings are included as required and additional duplex testing as needed. Limited examinations for reoccurring indications may be performed as noted.  ABI Findings: +---------+------------------+-----+---------+--------+ Right    Rt Pressure (mmHg)IndexWaveform Comment  +---------+------------------+-----+---------+--------+ Brachial 131                                      +---------+------------------+-----+---------+--------+ ATA      108               0.80 triphasic         +---------+------------------+-----+---------+--------+ PTA      125               0.93 triphasic         +---------+------------------+-----+---------+--------+ Great Toe92                0.68 Normal            +---------+------------------+-----+---------+--------+ +---------+------------------+-----+---------+-------+ Left     Lt Pressure (mmHg)IndexWaveform Comment +---------+------------------+-----+---------+-------+ Brachial 135                                     +---------+------------------+-----+---------+-------+ ATA      143               1.06 triphasic        +---------+------------------+-----+---------+-------+ PTA      139               1.03 triphasic        +---------+------------------+-----+---------+-------+ Great Toe98                0.73 Normal           +---------+------------------+-----+---------+-------+  +-------+-----------+-----------+------------+------------+  ABI/TBIToday's ABIToday's TBIPrevious ABIPrevious TBI +-------+-----------+-----------+------------+------------+ Right  .93        .68        .93         .85          +-------+-----------+-----------+------------+------------+ Left   1.06       .73        1.24        .90          +-------+-----------+-----------+------------+------------+ Bilateral ABIs appear essentially unchanged compared to prior study on 09/26/2019.  Summary: Right: Resting right ankle-brachial index indicates mild right lower extremity arterial disease. The right toe-brachial index is abnormal. Right TBI appears slightly decreased as compared to the previous exam on 09/26/2019. Left: Resting left ankle-brachial index is within normal range. No evidence of significant left lower extremity arterial disease. The left toe-brachial index is normal. Left TBI appears essentially unchanged as compared to the previous exam on 09/26/2019.  *See table(s) above for measurements and observations.  Electronically signed by Levora DredgeGregory Schnier MD on 12/06/2020 at 5:09:24 PM.    Final        Assessment & Plan:   1. Right foot pain ***  2. Atherosclerosis of native artery of right lower extremity with rest pain [facility](HCC) Although patient does have known PAD as well as some stenosis within the right popliteal artery, he does not have any other symptoms of peripheral arterial disease including claudication or rest pain.  Also based on the patient's description of his symptoms, even following intervention, the pain in his right foot persisted and changed very little.  Based on this I agree with podiatry's assessment that this is likely related to a neuroma with some possible nerve dysfunction.  We will continue to follow the patient on a regular basis due to his known peripheral arterial disease and stenosis.  The patient is advised that if he begins to experience claudication-like symptoms,  rest pain or wounds or ulcerations he should contact her office and let us know soon as possible we will have him follow-up sooner.  Otherwise patient will follow-up with noninvasive studies in 6 months.  3. Type 2 diabetes mellitus with peripheral neuropathy (HCC) Continue hypoglycemic medications as already ordered, these medications have been reviewed and there are no changes at this time.  Hgb A1C to be monitored as already arranged by primary service    Current Outpatient Medications on File Prior to Visit  Medication Sig Dispense Refill  . apixaban (ELIQUIS) 2.5 MG TABS tablet Take 2.5 mg by mouth 2 (two) times daily.    Marland Kitchen. atorvastatin (LIPITOR) 20 MG tablet Take 1 tablet (20 mg total) by mouth daily. 30 tablet 5  . clopidogrel (PLAVIX) 75 MG tablet TAKE 1 TABLET BY MOUTH EVERY DAY 90 tablet 3  . docusate sodium (COLACE) 100 MG capsule Take 100 mg by mouth 2 (two) times daily as needed for mild constipation.    . ergocalciferol (VITAMIN D2) 1.25 MG (50000 UT) capsule Take 50,000 Units by mouth once a week.    Marland Kitchen. FARXIGA 10 MG TABS tablet Take 10 mg by mouth daily.    . mesalamine (LIALDA) 1.2 g EC tablet Take by mouth.    Marland Kitchen. NOVOLIN N 100 UNIT/ML injection Inject 0-30 Units into the skin 2 (two) times daily before a meal.     . OZEMPIC, 0.25 OR 0.5 MG/DOSE, 2 MG/1.5ML SOPN Inject 0.5 mg into the skin once a week.    . pantoprazole (PROTONIX) 40 MG tablet  Take 40 mg by mouth 2 (two) times daily.    . rivaroxaban (XARELTO) 2.5 MG TABS tablet Take 1 tablet (2.5 mg total) by mouth 2 (two) times daily. 60 tablet 6  . metoprolol tartrate (LOPRESSOR) 100 MG tablet Take 1 tablet (100 mg total) by mouth once for 1 dose. Take 2 hours prior to your CT scan. 1 tablet 0   No current facility-administered medications on file prior to visit.    There are no Patient Instructions on file for this visit. No follow-ups on file.   Georgiana Spinner, NP

## 2021-03-11 ENCOUNTER — Other Ambulatory Visit: Payer: Self-pay | Admitting: *Deleted

## 2021-03-11 MED ORDER — ATORVASTATIN CALCIUM 20 MG PO TABS: 20.0000 mg | ORAL_TABLET | Freq: Every day | ORAL | 2 refills | Status: DC

## 2021-06-06 ENCOUNTER — Other Ambulatory Visit (INDEPENDENT_AMBULATORY_CARE_PROVIDER_SITE_OTHER): Payer: Self-pay | Admitting: Nurse Practitioner

## 2021-06-06 DIAGNOSIS — I739 Peripheral vascular disease, unspecified: Secondary | ICD-10-CM

## 2021-06-09 DIAGNOSIS — I10 Essential (primary) hypertension: Secondary | ICD-10-CM | POA: Insufficient documentation

## 2021-06-09 DIAGNOSIS — I70219 Atherosclerosis of native arteries of extremities with intermittent claudication, unspecified extremity: Secondary | ICD-10-CM | POA: Insufficient documentation

## 2021-06-09 NOTE — Progress Notes (Signed)
MRN : [id number]  Justin Adkins is a 55 y.o. (September 07, 1965) male who presents with chief complaint of check circulation.  History of Present Illness:   The patient returns to the office for followup and review of the noninvasive studies. There have been no interval changes in lower extremity symptoms. No interval shortening of the patient's claudication distance (although he does continue to have the chronic discomfort with walking that he has described in the past) or development of rest pain symptoms. No new ulcers or wounds have occurred since the last visit.  However, the surgical site from his Morton's neuroma excision continues to be a problem healing.  There have been no significant changes to the patient's overall health care.  The patient denies amaurosis fugax or recent TIA symptoms. There are no recent neurological changes noted. The patient denies history of DVT, PE or superficial thrombophlebitis. The patient denies recent episodes of angina or shortness of breath.   ABI Rt=1.13 and Lt=1.21  (previous ABI's Rt=0.93 and Lt=1.06) Duplex ultrasound of the right lower extremity shows a >70% focal lesion in the popliteal artery.   No outpatient medications have been marked as taking for the 06/10/21 encounter (Appointment) with Gilda Crease, Latina Craver, MD.    Past Medical History:  Diagnosis Date   Diabetes mellitus without complication (HCC)    GERD (gastroesophageal reflux disease)    History of echocardiogram    a. 07/2020 Echo: EF 60-65%, no rwma, mild LVH. Nl RV fxn. Mildly dil LA.   History of tobacco abuse    Hyperlipidemia    Peripheral vascular disease (HCC)    a. 05/2019 s/p PTA R popliteal/AT, mech thrombectomy to R popliteal/tibioperoneal trunk--> plavix & eliquis 2.5 bid since; 09/2019 LE u/s: near nl study.    Past Surgical History:  Procedure Laterality Date   COLONOSCOPY     COLONOSCOPY N/A 09/24/2020   Procedure: COLONOSCOPY;  Surgeon: Adkins Bill, MD;   Location: [facility] ENDOSCOPY;  Service: Endoscopy;  Laterality: N/A;   ESOPHAGOGASTRODUODENOSCOPY (EGD) WITH PROPOFOL N/A 02/28/2020   Procedure: ESOPHAGOGASTRODUODENOSCOPY (EGD) WITH PROPOFOL;  Surgeon: Adkins Bill, MD;  Location: [facility] ENDOSCOPY;  Service: Endoscopy;  Laterality: N/A;   FOOT SURGERY Right    HERNIA REPAIR     LOWER EXTREMITY ANGIOGRAPHY Right 05/25/2019   Procedure: LOWER EXTREMITY ANGIOGRAPHY;  Surgeon: Renford Dills, MD;  Location: [facility] INVASIVE CV LAB;  Service: Cardiovascular;  Laterality: Right;   TONSILLECTOMY     VASCULAR SURGERY      Social History Social History   Tobacco Use   Smoking status: Former    Packs/day: 1.00    Years: 35.00    Pack years: 35.00    Types: Cigarettes   Smokeless tobacco: Never  Vaping Use   Vaping Use: Never used  Substance Use Topics   Alcohol use: Yes    Comment: rarely   Drug use: No    Family History Family History  Problem Relation Age of Onset   Cancer Father    Heart attack Brother        MI @ 108    Allergies  Allergen Reactions   Naproxen Anaphylaxis   Liraglutide Nausea Only     REVIEW OF SYSTEMS (Negative unless checked)  Constitutional: [] Weight loss  [] Fever  [] Chills Cardiac: [] Chest pain   [] Chest pressure   [] Palpitations   [] Shortness of breath when laying flat   [] Shortness of breath with exertion. Vascular:  [x] Pain in legs with walking   []   Pain in legs at rest  [] History of DVT   [] Phlebitis   [] Swelling in legs   [] Varicose veins   [] Non-healing ulcers Pulmonary:   [] Uses home oxygen   [] Productive cough   [] Hemoptysis   [] Wheeze  [x] COPD   [] Asthma Neurologic:  [] Dizziness   [] Seizures   [] History of stroke   [] History of TIA  [] Aphasia   [] Vissual changes   [] Weakness or numbness in arm   [x] Weakness or numbness in leg Musculoskeletal:   [] Joint swelling   [] Joint pain   [] Low back pain Hematologic:  [] Easy bruising  [] Easy bleeding   [] Hypercoagulable state    [] Anemic Gastrointestinal:  [] Diarrhea   [] Vomiting  [] Gastroesophageal reflux/heartburn   [] Difficulty swallowing. Genitourinary:  [] Chronic kidney disease   [] Difficult urination  [] Frequent urination   [] Blood in urine Skin:  [] Rashes   [] Ulcers  Psychological:  [] History of anxiety   []  History of major depression.  Physical Examination  There were no vitals filed for this visit. There is no height or weight on file to calculate BMI. Gen: WD/WN, NAD Head: [place]/AT, No temporalis wasting.  Ear/Nose/Throat: Hearing grossly intact, nares w/o erythema or drainage Eyes: PER, EOMI, sclera nonicteric.  Neck: Supple, no masses.  No bruit or JVD.  Pulmonary:  Good air movement, no audible wheezing, no use of accessory muscles.  Cardiac: RRR, normal S1, S2, no Murmurs. Vascular:   Vessel Right Left  Radial Palpable Palpable  Gastrointestinal: soft, non-distended. No guarding/no peritoneal signs.  Musculoskeletal: M/S 5/5 throughout.  No visible deformity.  Neurologic: CN 2-12 intact. Pain and light touch intact in extremities.  Symmetrical.  Speech is fluent. Motor exam as listed above. Psychiatric: Judgment intact, Mood & affect appropriate for pt's clinical situation. Dermatologic: No rashes or ulcers noted.  No changes consistent with cellulitis.   CBC Lab Results  Component Value Date   WBC 4.8 07/27/2020   HGB 13.7 07/27/2020   HCT 39.5 07/27/2020   MCV 87.6 07/27/2020   PLT 186 07/27/2020    BMET    Component Value Date/Time   NA 141 09/10/2020 1124   K 4.5 09/10/2020 1124   CL 102 09/10/2020 1124   CO2 21 09/10/2020 1124   GLUCOSE 159 (H) 09/10/2020 1124   GLUCOSE 148 (H) 07/27/2020 0634   BUN 13 09/10/2020 1124   CREATININE 0.97 09/10/2020 1124   CALCIUM 9.6 09/10/2020 1124   GFRNONAA 88 09/10/2020 1124   GFRNONAA >60 07/27/2020 0634   GFRAA 102 09/10/2020 1124   CrCl cannot be calculated (Patient's most recent lab result is older than the maximum 21 days  allowed.).  COAG No results found for: INR, PROTIME  Radiology No results found.   Assessment/Plan 1. Atherosclerosis of native artery of right lower extremity with intermittent claudication (HCC)  Recommend:  The patient has evidence of atherosclerosis of the lower extremities with claudication.  The patient does voice some lifestyle changes at this point in time.  Furthermore, he continues to have a nonhealing wound on his foot.  Noninvasive studies do not suggest clinically significant change.  In fact, his ABI is normal and he has triphasic Doppler signals bilaterally.  The duplex does show a focal stenosis in the popliteal likely a recurrence of the original lesion.  No invasive studies, angiography or surgery at this time.  I did discuss with him angiography and intervention with treatment of the popliteal lesion.  I focused on his lifestyle complaints and not specifically on wound healing since given his  normal resting study he should have more than adequate perfusion for wound healing.  He wishes to think about this and will call the office if indeed he chooses to move forward with angiography and Rie intervention of the right popliteal.   The patient should continue walking and begin a more formal exercise program.  The patient should continue antiplatelet therapy and aggressive treatment of the lipid abnormalities  No changes in the patient's medications at this time  - VAS [place] ABI WITH/WO TBI; Future  2. Type 2 diabetes mellitus with peripheral neuropathy (HCC) Continue hypoglycemic medications as already ordered, these medications have been reviewed and there are no changes at this time.  Hgb A1C to be monitored as already arranged by primary service   3. Hyperlipidemia associated with type 2 diabetes mellitus (HCC) Continue statin as ordered and reviewed, no changes at this time   4. Essential hypertension Continue antihypertensive medications as already ordered, these  medications have been reviewed and there are no changes at this time.     Levora Dredge, MD  06/09/2021 11:47 AM

## 2021-06-09 NOTE — H&P (View-Only) (Signed)
  MRN : [id number]  Justin Adkins is a 55 y.o. (04/08/1966) male who presents with chief complaint of check circulation.  History of Present Illness:   The patient returns to the office for followup and review of the noninvasive studies. There have been no interval changes in lower extremity symptoms. No interval shortening of the patient's claudication distance (although he does continue to have the chronic discomfort with walking that he has described in the past) or development of rest pain symptoms. No new ulcers or wounds have occurred since the last visit.  However, the surgical site from his Morton's neuroma excision continues to be a problem healing.  There have been no significant changes to the patient's overall health care.  The patient denies amaurosis fugax or recent TIA symptoms. There are no recent neurological changes noted. The patient denies history of DVT, PE or superficial thrombophlebitis. The patient denies recent episodes of angina or shortness of breath.   ABI Rt=1.13 and Lt=1.21  (previous ABI's Rt=0.93 and Lt=1.06) Duplex ultrasound of the right lower extremity shows a >70% focal lesion in the popliteal artery.   No outpatient medications have been marked as taking for the 06/10/21 encounter (Appointment) with Tannar Broker G, MD.    Past Medical History:  Diagnosis Date   Diabetes mellitus without complication (HCC)    GERD (gastroesophageal reflux disease)    History of echocardiogram    a. 07/2020 Echo: EF 60-65%, no rwma, mild LVH. Nl RV fxn. Mildly dil LA.   History of tobacco abuse    Hyperlipidemia    Peripheral vascular disease (HCC)    a. 05/2019 s/p PTA R popliteal/AT, mech thrombectomy to R popliteal/tibioperoneal trunk--> plavix & eliquis 2.5 bid since; 09/2019 LE u/s: near nl study.    Past Surgical History:  Procedure Laterality Date   COLONOSCOPY     COLONOSCOPY N/A 09/24/2020   Procedure: COLONOSCOPY;  Surgeon: Locklear, Cameron T, MD;   Location: [facility] ENDOSCOPY;  Service: Endoscopy;  Laterality: N/A;   ESOPHAGOGASTRODUODENOSCOPY (EGD) WITH PROPOFOL N/A 02/28/2020   Procedure: ESOPHAGOGASTRODUODENOSCOPY (EGD) WITH PROPOFOL;  Surgeon: Locklear, Cameron T, MD;  Location: [facility] ENDOSCOPY;  Service: Endoscopy;  Laterality: N/A;   FOOT SURGERY Right    HERNIA REPAIR     LOWER EXTREMITY ANGIOGRAPHY Right 05/25/2019   Procedure: LOWER EXTREMITY ANGIOGRAPHY;  Surgeon: Jaylanni Eltringham G, MD;  Location: [facility] INVASIVE CV LAB;  Service: Cardiovascular;  Laterality: Right;   TONSILLECTOMY     VASCULAR SURGERY      Social History Social History   Tobacco Use   Smoking status: Former    Packs/day: 1.00    Years: 35.00    Pack years: 35.00    Types: Cigarettes   Smokeless tobacco: Never  Vaping Use   Vaping Use: Never used  Substance Use Topics   Alcohol use: Yes    Comment: rarely   Drug use: No    Family History Family History  Problem Relation Age of Onset   Cancer Father    Heart attack Brother        MI @ 38    Allergies  Allergen Reactions   Naproxen Anaphylaxis   Liraglutide Nausea Only     REVIEW OF SYSTEMS (Negative unless checked)  Constitutional: []Weight loss  []Fever  []Chills Cardiac: []Chest pain   []Chest pressure   []Palpitations   []Shortness of breath when laying flat   []Shortness of breath with exertion. Vascular:  [x]Pain in legs with walking   []  Pain in legs at rest  []History of DVT   []Phlebitis   []Swelling in legs   []Varicose veins   []Non-healing ulcers Pulmonary:   []Uses home oxygen   []Productive cough   []Hemoptysis   []Wheeze  [x]COPD   []Asthma Neurologic:  []Dizziness   []Seizures   []History of stroke   []History of TIA  []Aphasia   []Vissual changes   []Weakness or numbness in arm   [x]Weakness or numbness in leg Musculoskeletal:   []Joint swelling   []Joint pain   []Low back pain Hematologic:  []Easy bruising  []Easy bleeding   []Hypercoagulable state    []Anemic Gastrointestinal:  []Diarrhea   []Vomiting  []Gastroesophageal reflux/heartburn   []Difficulty swallowing. Genitourinary:  []Chronic kidney disease   []Difficult urination  []Frequent urination   []Blood in urine Skin:  []Rashes   []Ulcers  Psychological:  []History of anxiety   [] History of major depression.  Physical Examination  There were no vitals filed for this visit. There is no height or weight on file to calculate BMI. Gen: WD/WN, NAD Head: [place]/AT, No temporalis wasting.  Ear/Nose/Throat: Hearing grossly intact, nares w/o erythema or drainage Eyes: PER, EOMI, sclera nonicteric.  Neck: Supple, no masses.  No bruit or JVD.  Pulmonary:  Good air movement, no audible wheezing, no use of accessory muscles.  Cardiac: RRR, normal S1, S2, no Murmurs. Vascular:   Vessel Right Left  Radial Palpable Palpable  Gastrointestinal: soft, non-distended. No guarding/no peritoneal signs.  Musculoskeletal: M/S 5/5 throughout.  No visible deformity.  Neurologic: CN 2-12 intact. Pain and light touch intact in extremities.  Symmetrical.  Speech is fluent. Motor exam as listed above. Psychiatric: Judgment intact, Mood & affect appropriate for pt's clinical situation. Dermatologic: No rashes or ulcers noted.  No changes consistent with cellulitis.   CBC Lab Results  Component Value Date   WBC 4.8 07/27/2020   HGB 13.7 07/27/2020   HCT 39.5 07/27/2020   MCV 87.6 07/27/2020   PLT 186 07/27/2020    BMET    Component Value Date/Time   NA 141 09/10/2020 1124   K 4.5 09/10/2020 1124   CL 102 09/10/2020 1124   CO2 21 09/10/2020 1124   GLUCOSE 159 (H) 09/10/2020 1124   GLUCOSE 148 (H) 07/27/2020 0634   BUN 13 09/10/2020 1124   CREATININE 0.97 09/10/2020 1124   CALCIUM 9.6 09/10/2020 1124   GFRNONAA 88 09/10/2020 1124   GFRNONAA >60 07/27/2020 0634   GFRAA 102 09/10/2020 1124   CrCl cannot be calculated (Patient's most recent lab result is older than the maximum 21 days  allowed.).  COAG No results found for: INR, PROTIME  Radiology No results found.   Assessment/Plan 1. Atherosclerosis of native artery of right lower extremity with intermittent claudication (HCC)  Recommend:  The patient has evidence of atherosclerosis of the lower extremities with claudication.  The patient does voice some lifestyle changes at this point in time.  Furthermore, he continues to have a nonhealing wound on his foot.  Noninvasive studies do not suggest clinically significant change.  In fact, his ABI is normal and he has triphasic Doppler signals bilaterally.  The duplex does show a focal stenosis in the popliteal likely a recurrence of the original lesion.  No invasive studies, angiography or surgery at this time.  I did discuss with him angiography and intervention with treatment of the popliteal lesion.  I focused on his lifestyle complaints and not specifically on wound healing since given his   normal resting study he should have more than adequate perfusion for wound healing.  He wishes to think about this and will call the office if indeed he chooses to move forward with angiography and Rie intervention of the right popliteal.   The patient should continue walking and begin a more formal exercise program.  The patient should continue antiplatelet therapy and aggressive treatment of the lipid abnormalities  No changes in the patient's medications at this time  - VAS [place] ABI WITH/WO TBI; Future  2. Type 2 diabetes mellitus with peripheral neuropathy (HCC) Continue hypoglycemic medications as already ordered, these medications have been reviewed and there are no changes at this time.  Hgb A1C to be monitored as already arranged by primary service   3. Hyperlipidemia associated with type 2 diabetes mellitus (HCC) Continue statin as ordered and reviewed, no changes at this time   4. Essential hypertension Continue antihypertensive medications as already ordered, these  medications have been reviewed and there are no changes at this time.     Levora Dredge, MD  06/09/2021 11:47 AM

## 2021-06-10 ENCOUNTER — Ambulatory Visit (INDEPENDENT_AMBULATORY_CARE_PROVIDER_SITE_OTHER): Payer: BC Managed Care – PPO

## 2021-06-10 ENCOUNTER — Other Ambulatory Visit: Payer: Self-pay

## 2021-06-10 ENCOUNTER — Ambulatory Visit (INDEPENDENT_AMBULATORY_CARE_PROVIDER_SITE_OTHER): Payer: BC Managed Care – PPO | Admitting: Vascular Surgery

## 2021-06-10 ENCOUNTER — Encounter (INDEPENDENT_AMBULATORY_CARE_PROVIDER_SITE_OTHER): Payer: Self-pay | Admitting: Vascular Surgery

## 2021-06-10 VITALS — BP 134/89 | HR 78 | Ht 69.0 in | Wt 189.0 lb

## 2021-06-10 DIAGNOSIS — E1142 Type 2 diabetes mellitus with diabetic polyneuropathy: Secondary | ICD-10-CM | POA: Diagnosis not present

## 2021-06-10 DIAGNOSIS — I739 Peripheral vascular disease, unspecified: Secondary | ICD-10-CM

## 2021-06-10 DIAGNOSIS — I70211 Atherosclerosis of native arteries of extremities with intermittent claudication, right leg: Secondary | ICD-10-CM | POA: Diagnosis not present

## 2021-06-10 DIAGNOSIS — E785 Hyperlipidemia, unspecified: Secondary | ICD-10-CM

## 2021-06-10 DIAGNOSIS — I10 Essential (primary) hypertension: Secondary | ICD-10-CM | POA: Diagnosis not present

## 2021-06-10 DIAGNOSIS — E1169 Type 2 diabetes mellitus with other specified complication: Secondary | ICD-10-CM

## 2021-06-17 ENCOUNTER — Telehealth (INDEPENDENT_AMBULATORY_CARE_PROVIDER_SITE_OTHER): Payer: Self-pay

## 2021-06-17 NOTE — Telephone Encounter (Signed)
Spoke with the patient and he is scheduled with Dr. Gilda Crease for a right leg angio with intervention on 07/02/21 with a 10:45 am arrival time to the [facility]. Pre-procedure instructions were discussed and will be mailed.

## 2021-07-01 ENCOUNTER — Other Ambulatory Visit (INDEPENDENT_AMBULATORY_CARE_PROVIDER_SITE_OTHER): Payer: Self-pay | Admitting: Nurse Practitioner

## 2021-07-01 DIAGNOSIS — I70211 Atherosclerosis of native arteries of extremities with intermittent claudication, right leg: Secondary | ICD-10-CM

## 2021-07-02 ENCOUNTER — Other Ambulatory Visit: Payer: Self-pay

## 2021-07-02 ENCOUNTER — Encounter
Admission: RE | Disposition: A | Discharge status: Home / Self Care | Payer: Self-pay | Source: Ambulatory Visit | Attending: Vascular Surgery

## 2021-07-02 ENCOUNTER — Ambulatory Visit
Admission: RE | Admit: 2021-07-02 | Discharge: 2021-07-02 | Disposition: A | Discharge status: Home / Self Care | Payer: BC Managed Care – PPO | Source: Ambulatory Visit | Attending: Vascular Surgery | Admitting: Vascular Surgery

## 2021-07-02 ENCOUNTER — Encounter: Payer: Self-pay | Admitting: Vascular Surgery

## 2021-07-02 DIAGNOSIS — E785 Hyperlipidemia, unspecified: Secondary | ICD-10-CM | POA: Insufficient documentation

## 2021-07-02 DIAGNOSIS — I70219 Atherosclerosis of native arteries of extremities with intermittent claudication, unspecified extremity: Secondary | ICD-10-CM

## 2021-07-02 DIAGNOSIS — E1151 Type 2 diabetes mellitus with diabetic peripheral angiopathy without gangrene: Secondary | ICD-10-CM | POA: Insufficient documentation

## 2021-07-02 DIAGNOSIS — I70211 Atherosclerosis of native arteries of extremities with intermittent claudication, right leg: Secondary | ICD-10-CM | POA: Insufficient documentation

## 2021-07-02 DIAGNOSIS — E1142 Type 2 diabetes mellitus with diabetic polyneuropathy: Secondary | ICD-10-CM | POA: Diagnosis not present

## 2021-07-02 DIAGNOSIS — I10 Essential (primary) hypertension: Secondary | ICD-10-CM | POA: Diagnosis not present

## 2021-07-02 HISTORY — PX: LOWER EXTREMITY ANGIOGRAPHY: CATH118251

## 2021-07-02 LAB — BUN: BUN: 17 mg/dL (ref 6–20)

## 2021-07-02 LAB — CREATININE, SERUM
Creatinine, Ser: 0.86 mg/dL (ref 0.61–1.24)
GFR, Estimated: >60 mL/min (ref >60)

## 2021-07-02 LAB — GLUCOSE, CAPILLARY
Glucose-Capillary: 84 mg/dL (ref 70–99)
Glucose-Capillary: 81 mg/dL (ref 70–99)

## 2021-07-02 SURGERY — LOWER EXTREMITY ANGIOGRAPHY
Anesthesia: Moderate Sedation | Site: Leg Lower | Laterality: Right

## 2021-07-02 MED ORDER — IODIXANOL 320 MG/ML IV SOLN
INTRAVENOUS | Status: DC | PRN
  Administered 2021-07-02: 65 mL via INTRA_ARTERIAL

## 2021-07-02 MED ORDER — MIDAZOLAM HCL 2 MG/2ML IJ SOLN
INTRAMUSCULAR | Status: DC | PRN
  Administered 2021-07-02 (×2): 1 mg via INTRAVENOUS
  Administered 2021-07-02: 2 mg via INTRAVENOUS

## 2021-07-02 MED ORDER — FENTANYL CITRATE (PF) 100 MCG/2ML IJ SOLN
INTRAMUSCULAR | Status: DC | PRN
  Administered 2021-07-02: 25 ug via INTRAVENOUS
  Administered 2021-07-02 (×2): 50 ug via INTRAVENOUS

## 2021-07-02 MED ORDER — CEFAZOLIN SODIUM-DEXTROSE 2-4 GM/100ML-% IV SOLN
INTRAVENOUS | Status: AC
  Administered 2021-07-02: 2 g via INTRAVENOUS
  Filled 2021-07-02: qty 100

## 2021-07-02 MED ORDER — DIPHENHYDRAMINE HCL 50 MG/ML IJ SOLN: 50.0000 mg | Freq: Once | INTRAMUSCULAR | Status: DC | PRN

## 2021-07-02 MED ORDER — CEFAZOLIN SODIUM-DEXTROSE 2-4 GM/100ML-% IV SOLN: 2.0000 g | Freq: Once | INTRAVENOUS | Status: AC

## 2021-07-02 MED ORDER — MIDAZOLAM HCL 2 MG/2ML IJ SOLN
INTRAMUSCULAR | Status: AC
  Filled 2021-07-02: qty 4

## 2021-07-02 MED ORDER — MIDAZOLAM HCL 2 MG/2ML IJ SOLN
INTRAMUSCULAR | Status: AC
  Filled 2021-07-02: qty 2

## 2021-07-02 MED ORDER — FENTANYL CITRATE PF 50 MCG/ML IJ SOSY
PREFILLED_SYRINGE | INTRAMUSCULAR | Status: AC
  Filled 2021-07-02: qty 2

## 2021-07-02 MED ORDER — FENTANYL CITRATE PF 50 MCG/ML IJ SOSY
PREFILLED_SYRINGE | INTRAMUSCULAR | Status: AC
  Filled 2021-07-02: qty 1

## 2021-07-02 MED ORDER — HEPARIN SODIUM (PORCINE) 1000 UNIT/ML IJ SOLN
INTRAMUSCULAR | Status: AC
  Filled 2021-07-02: qty 1

## 2021-07-02 MED ORDER — METHYLPREDNISOLONE SODIUM SUCC 125 MG IJ SOLR: 125.0000 mg | Freq: Once | INTRAMUSCULAR | Status: DC | PRN

## 2021-07-02 MED ORDER — FAMOTIDINE 20 MG PO TABS: 40.0000 mg | ORAL_TABLET | Freq: Once | ORAL | Status: DC | PRN

## 2021-07-02 MED ORDER — HEPARIN SODIUM (PORCINE) 1000 UNIT/ML IJ SOLN
INTRAMUSCULAR | Status: DC | PRN
  Administered 2021-07-02: 6000 [IU] via INTRAVENOUS

## 2021-07-02 MED ORDER — SODIUM CHLORIDE 0.9 % IV SOLN: INTRAVENOUS | Status: DC

## 2021-07-02 MED ORDER — MORPHINE SULFATE (PF) 4 MG/ML IV SOLN: 2.0000 mg | INTRAVENOUS | Status: DC | PRN

## 2021-07-02 MED ORDER — ONDANSETRON HCL 4 MG/2ML IJ SOLN: 4.0000 mg | Freq: Four times a day (QID) | INTRAMUSCULAR | Status: DC | PRN

## 2021-07-02 MED ORDER — HYDROMORPHONE HCL 1 MG/ML IJ SOLN: 1.0000 mg | Freq: Once | INTRAMUSCULAR | Status: DC | PRN

## 2021-07-02 MED ORDER — MIDAZOLAM HCL 2 MG/ML PO SYRP: 8.0000 mg | ORAL_SOLUTION | Freq: Once | ORAL | Status: DC | PRN

## 2021-07-02 SURGICAL SUPPLY — 24 items
BALLN LUTONIX  018 4X60X130 (BALLOONS) ×2
BALLN LUTONIX 018 4X60X130 (BALLOONS) ×1
BALLN ULTRASCOR 014 2.5X40X150 (BALLOONS) ×3
BALLOON LUTONIX 018 4X60X130 (BALLOONS) IMPLANT
BALLOON ULTRSCR 014 2.5X40X150 (BALLOONS) IMPLANT
CATH ANGIO 5F PIGTAIL 65CM (CATHETERS) ×2 IMPLANT
CATH ROTAREX 135 6FR (CATHETERS) ×2 IMPLANT
CATH VERT 5X100 (CATHETERS) ×2 IMPLANT
COVER PROBE U/S 5X48 (MISCELLANEOUS) ×2 IMPLANT
DEVICE STARCLOSE SE CLOSURE (Vascular Products) ×2 IMPLANT
GAUZE SPONGE 4X4 12PLY STRL (GAUZE/BANDAGES/DRESSINGS) ×6 IMPLANT
GLIDEWIRE ADV .035X260CM (WIRE) ×2 IMPLANT
KIT ENCORE 26 ADVANTAGE (KITS) ×2 IMPLANT
NDL ENTRY 21GA 7CM ECHOTIP (NEEDLE) IMPLANT
NEEDLE ENTRY 21GA 7CM ECHOTIP (NEEDLE) ×3 IMPLANT
PACK ANGIOGRAPHY (CUSTOM PROCEDURE TRAY) ×3 IMPLANT
SET INTRO CAPELLA COAXIAL (SET/KITS/TRAYS/PACK) ×2 IMPLANT
SHEATH BRITE TIP 5FRX11 (SHEATH) ×2 IMPLANT
SHEATH RAABE 6FR (SHEATH) ×2 IMPLANT
SYR MEDRAD MARK 7 150ML (SYRINGE) ×2 IMPLANT
TUBING CONTRAST HIGH PRESS 72 (TUBING) ×2 IMPLANT
WIRE G V18X300CM (WIRE) ×2 IMPLANT
WIRE GUIDERIGHT .035X150 (WIRE) ×2 IMPLANT
WIRE RUNTHROUGH .014X300CM (WIRE) ×2 IMPLANT

## 2021-07-02 NOTE — Op Note (Signed)
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  Introduction catheter into right lower extremity 3rd order catheter placement               2.    Contrast injection right lower extremity for distal runoff             3.  Percutaneous atherectomy using the Rota Rex device with transluminal angioplasty right popliteal artery to 4 mm with a Lutonix balloon             4.  Percutaneous transluminal angioplasty right anterior tibial artery to 2.5 mm with an ultra score balloon             5.  Star close closure left common femoral arteriotomy  Anesthesia: Conscious sedation was administered under my direct supervision by the interventional radiology RN. IV Versed plus fentanyl were utilized. Continuous ECG, pulse oximetry and blood pressure was monitored throughout the entire procedure.  Conscious sedation was for a total of 59 minutes.  Sheath: 6 [place] Rabie left common femoral retrograde  Contrast: 65 cc  Fluoroscopy Time: 6.9 minutes  Indications:  Justin Adkins presents with increasing pain of the right lower extremity.  Patient has a history of previous intervention.  Noninvasive studies as well as physical examination confirm greater than 80% recurrent stenosis at the previous intervention site in association with increasing tibial disease.  This suggests the patient is at risk for developing limb threatening ischemia. The risks and benefits are reviewed all questions answered patient agrees to proceed.  Procedure: Justin Adkins is a 55 y.o. y.o. male who was identified and appropriate procedural time out was performed.  The patient was then placed supine on the  table and prepped and draped in the usual sterile fashion.    Ultrasound was placed in the sterile sleeve and the left groin was evaluated the left common femoral artery was echolucent and pulsatile indicating patency.  Image was recorded for the permanent record and under real-time visualization a microneedle was inserted into the common femoral artery followed by the microwire and then the micro-sheath.  A J-wire was then advanced through the micro-sheath and a  5 [place] sheath was then inserted over a J-wire. J-wire was then advanced and a 5 French pigtail catheter was positioned at the level of T12.  AP projection of the aorta was then obtained. Pigtail catheter was repositioned to above the bifurcation and a LAO view of the pelvis was obtained.  Subsequently a pigtail catheter with the stiff angle Glidewire was used to cross the aortic bifurcation the catheter wire were advanced down into the right distal external iliac artery. Oblique view of the femoral bifurcation was then obtained and subsequently the wire was reintroduced and the pigtail catheter negotiated into the SFA representing third order catheter placement. Distal runoff was then performed.  Diagnostic interpretation: The abdominal aorta is opacified with a bolus injection contrast.  There are mild atherosclerotic changes but no hemodynamically significant lesions.  Bilateral single renal arteries are noted they are widely patent.  The distal aorta as well as the common iliac and external iliac arteries are widely patent.  There is mild atherosclerotic changes of  the common on the right.  There are no hemodynamically significant stenoses in the common and external iliac arteries.  The right internal iliac artery demonstrates a string sign at its origin.  The right common femoral profunda femoris and superficial femoral arteries demonstrate atherosclerotic changes but there are no hemodynamically significant stenoses.  In the mid popliteal  there is a greater than 80% stenosis.  This covers a length of approximately 3 to 4 cm.  The distal popliteal artery is widely patent.  The origin of the anterior tibial demonstrates a greater than 90% stenosis over approximately 5 mm to 10 mm in length.  Distal to this lesion the anterior tibial is of good caliber free of hemodynamically significant stenoses and otherwise the dominant runoff into the foot.  Tibioperoneal trunk is widely patent peroneal is patent but somewhat small.  Posterior tibial is patent throughout its length but in its middle one third there is greater than 70% diffuse narrowing that extends over approximately 8 to 10 cm in length.  Plantar vessels in the dorsalis pedis are both widely patent and connect via an intact pedal arch.  Based on these findings I elected to move forward with intervention.  5000 units of heparin was then given and allowed to circulate and a 6 [place] Rabie sheath was advanced up and over the bifurcation and positioned in the femoral artery  A vertebral catheter with a advantage Glidewire were then negotiated down into the distal popliteal and then into the anterior tibial.  The advantage wire was then exchanged for a V 18 wire.  The [place] Rex atherectomy catheter was then prepped on the field advanced over the wire and atherectomy performed of the mid popliteal lesion.  Approximately 6 passes were made.  Next a 4 mm x 60 mm Lutonix balloon was used to angioplasty the popliteal. Each inflation was for 1 minutes at 8 atm. Follow-up imaging demonstrated excellent patency with less than 10% residual stenosis and preservation of the distal runoff.  The detector was then repositioned and the anterior tibial artery was reimaged.  A greater 90% ostial stenosis is noted in the anterior tibial artery and after appropriate measurements are made a 2.5 mm x 40 mm ultra score balloon was used to angioplasty the anterior tibial.  Should be noted 0.014 wire was exchanged for  the V 18 so that we could use the ultra score balloon.  Inflation was to 10 atmospheres for 1 minute. Follow-up imaging demonstrated patency of the anterior tibial with less than 10% residual stenosis excellent result. Distal runoff was then reassessed and noted to be widely patent.    After review of these images the sheath is pulled into the left external iliac oblique of the common femoral is obtained and a Star close device deployed. There no immediate Complications.  Findings:   The abdominal aorta is opacified with a bolus injection contrast.  There are mild atherosclerotic changes but no hemodynamically significant lesions.  Bilateral single renal arteries are noted they are widely patent.  The distal aorta as well as the common iliac and external iliac arteries are widely patent.  There is mild atherosclerotic changes of the common on the right.  There are no hemodynamically significant stenoses in the common and external iliac arteries.  The right internal iliac artery demonstrates a string sign at its origin.  The right common femoral profunda femoris and superficial femoral arteries demonstrate atherosclerotic changes but there are no hemodynamically significant stenoses.  In the mid popliteal  there is a greater than 80% stenosis.  This covers a length of approximately 3 to 4 cm.  The distal popliteal artery is widely patent.  The origin of the anterior tibial demonstrates a greater than 90% stenosis over approximately 5 mm to 10 mm in length.  Distal to this lesion the anterior tibial is of good caliber free of hemodynamically significant stenoses and otherwise the dominant runoff into the foot.  Tibioperoneal trunk is widely patent peroneal is patent but somewhat small.  Posterior tibial is patent throughout its length but in its middle one third there is greater than 70% diffuse narrowing that extends over approximately 8 to 10 cm in length.  Plantar vessels in the dorsalis pedis are both widely  patent and connect via an intact pedal arch.  Following angioplasty of the anterior tibial there is now in-line flow with less than 10% residual stenosis and the artery looks quite nice.  Atherectomy with angioplasty of the right popliteal yields an excellent result with less than 10% residual stenosis.  Summary: Successful recanalization right lower extremity for limb salvage                           Disposition: Patient was taken to the recovery room in stable condition having tolerated the procedure well.  Belenda Cruise Trenden Hazelrigg 07/02/2021,12:34 PM

## 2021-07-02 NOTE — Interval H&P Note (Signed)
History and Physical Interval Note:  07/02/2021 10:41 AM  Justin Adkins  has presented today for surgery, with the diagnosis of RLE Angio w intervention  BARD   ASO w claudication.  The various methods of treatment have been discussed with the patient and family. After consideration of risks, benefits and other options for treatment, the patient has consented to  Procedure(s): LOWER EXTREMITY ANGIOGRAPHY (Right) as a surgical intervention.  The patient's history has been reviewed, patient examined, no change in status, stable for surgery.  I have reviewed the patient's chart and labs.  Questions were answered to the patient's satisfaction.     Levora Dredge

## 2021-07-03 ENCOUNTER — Encounter: Payer: Self-pay | Admitting: Vascular Surgery

## 2021-07-12 ENCOUNTER — Other Ambulatory Visit (INDEPENDENT_AMBULATORY_CARE_PROVIDER_SITE_OTHER): Payer: Self-pay | Admitting: Nurse Practitioner

## 2021-07-25 ENCOUNTER — Encounter (INDEPENDENT_AMBULATORY_CARE_PROVIDER_SITE_OTHER): Payer: BC Managed Care – PPO | Admitting: Vascular Surgery

## 2021-07-25 ENCOUNTER — Encounter (INDEPENDENT_AMBULATORY_CARE_PROVIDER_SITE_OTHER): Payer: BC Managed Care – PPO

## 2021-08-05 ENCOUNTER — Other Ambulatory Visit: Payer: Self-pay

## 2021-08-05 ENCOUNTER — Ambulatory Visit (INDEPENDENT_AMBULATORY_CARE_PROVIDER_SITE_OTHER): Payer: BC Managed Care – PPO | Admitting: Vascular Surgery

## 2021-08-05 ENCOUNTER — Encounter (INDEPENDENT_AMBULATORY_CARE_PROVIDER_SITE_OTHER): Payer: Self-pay | Admitting: Vascular Surgery

## 2021-08-05 ENCOUNTER — Ambulatory Visit (INDEPENDENT_AMBULATORY_CARE_PROVIDER_SITE_OTHER): Payer: BC Managed Care – PPO

## 2021-08-05 VITALS — BP 122/79 | HR 72 | Resp 16 | Wt 188.0 lb

## 2021-08-05 DIAGNOSIS — E1142 Type 2 diabetes mellitus with diabetic polyneuropathy: Secondary | ICD-10-CM

## 2021-08-05 DIAGNOSIS — I70211 Atherosclerosis of native arteries of extremities with intermittent claudication, right leg: Secondary | ICD-10-CM

## 2021-08-05 DIAGNOSIS — E1169 Type 2 diabetes mellitus with other specified complication: Secondary | ICD-10-CM | POA: Diagnosis not present

## 2021-08-05 DIAGNOSIS — I10 Essential (primary) hypertension: Secondary | ICD-10-CM | POA: Diagnosis not present

## 2021-08-05 DIAGNOSIS — E785 Hyperlipidemia, unspecified: Secondary | ICD-10-CM

## 2021-08-12 ENCOUNTER — Encounter (INDEPENDENT_AMBULATORY_CARE_PROVIDER_SITE_OTHER): Payer: Self-pay | Admitting: Vascular Surgery

## 2021-08-12 NOTE — Progress Notes (Signed)
MRN : [id number]  Justin Adkins is a 55 y.o. (17-May-1966) male who presents with chief complaint of check circulation.  History of Present Illness:   The patient returns to the office for followup and review status post angiogram with intervention on 07/02/2021.  Procedure:  Percutaneous atherectomy using the Rota Rex device with transluminal angioplasty right popliteal artery to 4 mm with a Lutonix balloon 2.    Percutaneous transluminal angioplasty right anterior   tibial artery to 2.5 mm with an ultra score balloon   The patient notes improvement in the lower extremity symptoms. No interval shortening of the patient's claudication distance or rest pain symptoms. Previous wounds have now healed.  No new ulcers or wounds have occurred since the last visit.  There have been no significant changes to the patient's overall health care.  The patient denies amaurosis fugax or recent TIA symptoms. There are no recent neurological changes noted. The patient denies history of DVT, PE or superficial thrombophlebitis. The patient denies recent episodes of angina or shortness of breath.   ABI's Rt=1.13 and Lt=1.19  (previous ABI's Rt=1.13 and Lt=1.21)    Current Meds  Medication Sig   clopidogrel (PLAVIX) 75 MG tablet TAKE 1 TABLET BY MOUTH EVERY DAY   docusate sodium (COLACE) 100 MG capsule Take 100 mg by mouth 2 (two) times daily as needed for mild constipation.   ergocalciferol (VITAMIN D2) 1.25 MG (50000 UT) capsule Take 50,000 Units by mouth every Saturday.   FARXIGA 10 MG TABS tablet Take 10 mg by mouth daily.   mesalamine (LIALDA) 1.2 g EC tablet Take 4.8 g by mouth daily with breakfast.   NOVOLIN N 100 UNIT/ML injection Inject 30 Units into the skin 2 (two) times daily before a meal.   OZEMPIC, 0.25 OR 0.5 MG/DOSE, 2 MG/1.5ML SOPN Inject 0.5 mg into the skin every Saturday.   pantoprazole (PROTONIX) 40 MG tablet Take 40 mg by mouth 2 (two) times daily.   XARELTO 2.5 MG TABS tablet  TAKE 1 TABLET BY MOUTH TWICE A DAY    Past Medical History:  Diagnosis Date   Diabetes mellitus without complication (HCC)    GERD (gastroesophageal reflux disease)    History of echocardiogram    a. 07/2020 Echo: EF 60-65%, no rwma, mild LVH. Nl RV fxn. Mildly dil LA.   History of tobacco abuse    Hyperlipidemia    Peripheral vascular disease (HCC)    a. 05/2019 s/p PTA R popliteal/AT, mech thrombectomy to R popliteal/tibioperoneal trunk--> plavix & eliquis 2.5 bid since; 09/2019 LE u/s: near nl study.    Past Surgical History:  Procedure Laterality Date   COLONOSCOPY     COLONOSCOPY N/A 09/24/2020   Procedure: COLONOSCOPY;  Surgeon: Regis Bill, MD;  Location: [facility] ENDOSCOPY;  Service: Endoscopy;  Laterality: N/A;   ESOPHAGOGASTRODUODENOSCOPY (EGD) WITH PROPOFOL N/A 02/28/2020   Procedure: ESOPHAGOGASTRODUODENOSCOPY (EGD) WITH PROPOFOL;  Surgeon: Regis Bill, MD;  Location: [facility] ENDOSCOPY;  Service: Endoscopy;  Laterality: N/A;   FOOT SURGERY Right    HERNIA REPAIR     LOWER EXTREMITY ANGIOGRAPHY Right 05/25/2019   Procedure: LOWER EXTREMITY ANGIOGRAPHY;  Surgeon: Renford Dills, MD;  Location: [facility] INVASIVE CV LAB;  Service: Cardiovascular;  Laterality: Right;   LOWER EXTREMITY ANGIOGRAPHY Right 07/02/2021   Procedure: LOWER EXTREMITY ANGIOGRAPHY;  Surgeon: Renford Dills, MD;  Location: [facility] INVASIVE CV LAB;  Service: Cardiovascular;  Laterality: Right;   TONSILLECTOMY     VASCULAR SURGERY  Social History Social History   Tobacco Use   Smoking status: Former    Packs/day: 1.00    Years: 35.00    Pack years: 35.00    Types: Cigarettes   Smokeless tobacco: Never  Vaping Use   Vaping Use: Never used  Substance Use Topics   Alcohol use: Yes    Comment: rarely   Drug use: No    Family History Family History  Problem Relation Age of Onset   Cancer Father    Heart attack Brother        MI @ 52    Allergies  Allergen Reactions   Naproxen  Anaphylaxis   Liraglutide Nausea Only    (Victoza)     REVIEW OF SYSTEMS (Negative unless checked)  Constitutional: Weight loss  Fever  Chills Cardiac: Chest pain   Chest pressure   Palpitations   Shortness of breath when laying flat   Shortness of breath with exertion. Vascular:  Pain in legs with walking   Pain in legs at rest  History of DVT   Phlebitis   Swelling in legs   Varicose veins   Non-healing ulcers Pulmonary:   Uses home oxygen   Productive cough   Hemoptysis   Wheeze  COPD   Asthma Neurologic:  Dizziness   Seizures   History of stroke   History of TIA  Aphasia   Vissual changes   Weakness or numbness in arm   Weakness or numbness in leg Musculoskeletal:   Joint swelling   Joint pain   Low back pain Hematologic:  Easy bruising  Easy bleeding   Hypercoagulable state   Anemic Gastrointestinal:  Diarrhea   Vomiting  Gastroesophageal reflux/heartburn   Difficulty swallowing. Genitourinary:  Chronic kidney disease   Difficult urination  Frequent urination   Blood in urine Skin:  Rashes   Ulcers  Psychological:  History of anxiety    History of major depression.  Physical Examination  Vitals:   08/05/21 1415  BP: 122/79  Pulse: 72  Resp: 16  Weight: 188 lb (85.3 kg)   Body mass index is 27.76 kg/m. Gen: WD/WN, NAD Head: [place]/AT, No temporalis wasting.  Ear/Nose/Throat: Hearing grossly intact, nares w/o erythema or drainage Eyes: PER, EOMI, sclera nonicteric.  Neck: Supple, no masses.  No bruit or JVD.  Pulmonary:  Good air movement, no audible wheezing, no use of accessory muscles.  Cardiac: RRR, normal S1, S2, no Murmurs. Vascular:   Vessel Right Left  Radial Palpable Palpable  PT Trace Palpable Palpable  DP Trace Palpable Palpable  Gastrointestinal: soft, non-distended. No guarding/no peritoneal signs.  Musculoskeletal: M/S 5/5 throughout.  No visible deformity.   Neurologic: CN 2-12 intact. Pain and light touch intact in extremities.  Symmetrical.  Speech is fluent. Motor exam as listed above. Psychiatric: Judgment intact, Mood & affect appropriate for pt's clinical situation. Dermatologic: No rashes or ulcers noted.  No changes consistent with cellulitis.   CBC Lab Results  Component Value Date   WBC 4.8 07/27/2020   HGB 13.7 07/27/2020   HCT 39.5 07/27/2020   MCV 87.6 07/27/2020   PLT 186 07/27/2020    BMET    Component Value Date/Time   NA 141 09/10/2020 1124   K 4.5 09/10/2020 1124   CL 102 09/10/2020 1124   CO2 21 09/10/2020 1124   GLUCOSE 159 (H) 09/10/2020 1124   GLUCOSE 148 (H) 07/27/2020 0634   BUN 17 07/02/2021 1045   BUN 13 09/10/2020 1124   CREATININE  0.86 07/02/2021 1045   CALCIUM 9.6 09/10/2020 1124   GFRNONAA >60 07/02/2021 1045   GFRAA 102 09/10/2020 1124   CrCl cannot be calculated (Patient's most recent lab result is older than the maximum 21 days allowed.).  COAG No results found for: INR, PROTIME  Radiology VAS [place] ABI WITH/WO TBI  Result Date: 08/05/2021  LOWER EXTREMITY DOPPLER STUDY Patient Name:  Justin Adkins  Date of Exam:   08/05/2021 Medical Rec #: [id number]       Accession #:    [id number] Date of Birth: 05/09/1966       Patient Gender: M Patient Age:   34 years Exam Location:  [facility] Procedure:      VAS [place] ABI WITH/WO TBI Referring Phys: [facility] --------------------------------------------------------------------------------  Indications: Peripheral artery disease.  Vascular Interventions: 05/25/19: Right TP trunk & peroneal thrombectomies with                         right popliteal & anterior tibial PTAs;                         07/02/21: Right popliteal & anterior tibial artery                         PTAs;. Performing Technologist: Jamse Mead RT, RDMS, RVT  Examination Guidelines: A complete evaluation includes at minimum, Doppler waveform signals and systolic blood  pressure reading at the level of bilateral brachial, anterior tibial, and posterior tibial arteries, when vessel segments are accessible. Bilateral testing is considered an integral part of a complete examination. Photoelectric Plethysmograph (PPG) waveforms and toe systolic pressure readings are included as required and additional duplex testing as needed. Limited examinations for reoccurring indications may be performed as noted.  ABI Findings: +---------+------------------+-----+---------+---------------+  Right     Rt Pressure (mmHg) Index Waveform  Comment          +---------+------------------+-----+---------+---------------+  Brachial  121                                                 +---------+------------------+-----+---------+---------------+  Popliteal                          triphasic patent PTA site  +---------+------------------+-----+---------+---------------+  ATA       140                1.13  triphasic                  +---------+------------------+-----+---------+---------------+  PTA       138                1.11  biphasic                   +---------+------------------+-----+---------+---------------+  Great Toe 97                 0.78  Normal                     +---------+------------------+-----+---------+---------------+ +---------+------------------+-----+--------+-------+  Left      Lt Pressure (mmHg) Index Waveform Comment  +---------+------------------+-----+--------+-------+  Brachial  124                                        +---------+------------------+-----+--------+-------+  ATA       147                1.19  biphasic          +---------+------------------+-----+--------+-------+  PTA       148                1.19  biphasic          +---------+------------------+-----+--------+-------+  Great Toe 113                0.91  Normal            +---------+------------------+-----+--------+-------+ +-------+-----------+-----------+------------+------------+  ABI/TBI Today's  ABI Today's TBI Previous ABI Previous TBI  +-------+-----------+-----------+------------+------------+  Right   1.13        0.78        1.13         0.85          +-------+-----------+-----------+------------+------------+  Left    1.19        0.91        1.21         0.94          +-------+-----------+-----------+------------+------------+ Bilateral ABIs and TBIs appear essentially unchanged compared to prior study on 06/10/21.  Summary: Bilateral: Bilateral ankle-brachial indexes are within normal range. No evidence of significant lower extremity arterial disease. Bilateral toe-brachial indexes are within normal range.  *See table(s) above for measurements and observations.  Electronically signed by Levora Dredge MD on 08/05/2021 at 5:08:15 PM.    Final      Assessment/Plan 1. Atherosclerosis of native artery of right lower extremity with intermittent claudication (HCC) Recommend:  The patient is status post successful angiogram with intervention.  The patient reports that the claudication symptoms and leg pain is essentially gone.   The patient denies lifestyle limiting changes at this point in time.  No further invasive studies, angiography or surgery at this time The patient should continue walking and begin a more formal exercise program.  The patient should continue antiplatelet therapy and aggressive treatment of the lipid abnormalities  Smoking cessation was again discussed  The patient should continue wearing graduated compression socks 10-15 mmHg strength to control the mild edema.  Patient should undergo noninvasive studies as ordered. The patient will follow up with me after the studies.   - VAS [place] ABI WITH/WO TBI; Future  2. Essential hypertension Continue antihypertensive medications as already ordered, these medications have been reviewed and there are no changes at this time.   3. Hyperlipidemia associated with type 2 diabetes mellitus (HCC) Continue statin as ordered and  reviewed, no changes at this time   4. Type 2 diabetes mellitus with peripheral neuropathy (HCC) Continue hypoglycemic medications as already ordered, these medications have been reviewed and there are no changes at this time.  Hgb A1C to be monitored as already arranged by primary service    Levora Dredge, MD  08/12/2021 3:33 PM

## 2021-12-09 ENCOUNTER — Ambulatory Visit (INDEPENDENT_AMBULATORY_CARE_PROVIDER_SITE_OTHER): Payer: BC Managed Care – PPO | Admitting: Vascular Surgery

## 2021-12-09 ENCOUNTER — Encounter (INDEPENDENT_AMBULATORY_CARE_PROVIDER_SITE_OTHER): Payer: BC Managed Care – PPO

## 2021-12-30 ENCOUNTER — Ambulatory Visit (INDEPENDENT_AMBULATORY_CARE_PROVIDER_SITE_OTHER): Payer: BC Managed Care – PPO | Admitting: Vascular Surgery

## 2021-12-30 ENCOUNTER — Encounter (INDEPENDENT_AMBULATORY_CARE_PROVIDER_SITE_OTHER): Payer: BC Managed Care – PPO

## 2022-02-10 ENCOUNTER — Ambulatory Visit (INDEPENDENT_AMBULATORY_CARE_PROVIDER_SITE_OTHER): Payer: BC Managed Care – PPO

## 2022-02-10 ENCOUNTER — Ambulatory Visit (INDEPENDENT_AMBULATORY_CARE_PROVIDER_SITE_OTHER): Payer: BC Managed Care – PPO | Admitting: Vascular Surgery

## 2022-02-10 ENCOUNTER — Encounter (INDEPENDENT_AMBULATORY_CARE_PROVIDER_SITE_OTHER): Payer: Self-pay | Admitting: Vascular Surgery

## 2022-02-10 VITALS — BP 123/73 | HR 75 | Resp 17 | Ht 68.0 in | Wt 183.0 lb

## 2022-02-10 DIAGNOSIS — E1142 Type 2 diabetes mellitus with diabetic polyneuropathy: Secondary | ICD-10-CM

## 2022-02-10 DIAGNOSIS — I70211 Atherosclerosis of native arteries of extremities with intermittent claudication, right leg: Secondary | ICD-10-CM

## 2022-02-10 DIAGNOSIS — E782 Mixed hyperlipidemia: Secondary | ICD-10-CM

## 2022-02-10 DIAGNOSIS — I10 Essential (primary) hypertension: Secondary | ICD-10-CM | POA: Diagnosis not present

## 2022-02-24 ENCOUNTER — Encounter (INDEPENDENT_AMBULATORY_CARE_PROVIDER_SITE_OTHER): Payer: Self-pay

## 2022-02-24 ENCOUNTER — Encounter (INDEPENDENT_AMBULATORY_CARE_PROVIDER_SITE_OTHER): Payer: BC Managed Care – PPO

## 2022-02-24 ENCOUNTER — Ambulatory Visit (INDEPENDENT_AMBULATORY_CARE_PROVIDER_SITE_OTHER): Payer: BC Managed Care – PPO | Admitting: Vascular Surgery

## 2022-04-29 ENCOUNTER — Emergency Department
Admission: EM | Admit: 2022-04-29 | Discharge: 2022-04-30 | Disposition: A | Discharge status: Home / Self Care | Payer: BC Managed Care – PPO | Attending: Emergency Medicine | Admitting: Emergency Medicine

## 2022-04-29 ENCOUNTER — Encounter: Payer: Self-pay | Admitting: Emergency Medicine

## 2022-04-29 ENCOUNTER — Other Ambulatory Visit: Payer: Self-pay

## 2022-04-29 DIAGNOSIS — S0081XA Abrasion of other part of head, initial encounter: Secondary | ICD-10-CM | POA: Insufficient documentation

## 2022-04-29 DIAGNOSIS — X58XXXA Exposure to other specified factors, initial encounter: Secondary | ICD-10-CM | POA: Diagnosis not present

## 2022-04-29 DIAGNOSIS — E119 Type 2 diabetes mellitus without complications: Secondary | ICD-10-CM | POA: Insufficient documentation

## 2022-04-29 DIAGNOSIS — S0993XA Unspecified injury of face, initial encounter: Secondary | ICD-10-CM | POA: Diagnosis present

## 2022-04-29 DIAGNOSIS — Z7901 Long term (current) use of anticoagulants: Secondary | ICD-10-CM | POA: Diagnosis not present

## 2022-04-29 NOTE — ED Provider Notes (Signed)
[facility] Provider Note    Event Date/Time   First MD Initiated Contact with Patient 04/29/22 2337     (approximate)   History   Facial Laceration   HPI  Justin BROUILLET is a 56 y.o. male with a history of diabetes hyperlipidemia DVT on Xarelto who comes to the ED due to bleeding from his left cheek.  He reports that he was getting in his car to go home from work when he started having bleeding in the area, not sure if he scratched it or otherwise sustained some sort of laceration to the area.  He was otherwise in his usual state of health prior to this coming.  He kept it covered with paper towels and Band-Aids because it continued to bleed, and now in the treatment room it seems to have stopped.  No other complaints, compliant with medications, no chest pain or shortness of breath.  No fever.     Physical Exam   Triage Vital Signs: ED Triage Vitals  Enc Vitals Group     BP 04/29/22 2335 (!) 155/89     Pulse Rate 04/29/22 2335 81     Resp 04/29/22 2335 18     Temp 04/29/22 2335 98.6 F (37 C)     Temp Source 04/29/22 2335 Oral     SpO2 04/29/22 2335 94 %     Weight 04/29/22 2335 185 lb (83.9 kg)     Height 04/29/22 2335 5\' 8"  (1.727 m)     Head Circumference --      Peak Flow --      Pain Score 04/29/22 2335 0     Pain Loc --      Pain Edu? --      Excl. in GC? --     Most recent vital signs: Vitals:   04/29/22 2335  BP: (!) 155/89  Pulse: 81  Resp: 18  Temp: 98.6 F (37 C)  SpO2: 94%    General: Awake, no distress.  CV:  Good peripheral perfusion.  Resp:  Normal effort.  Abd:  No distention.  Other:  Bandage removed from left face revealing a small cutaneous lesion on the left maxilla consistent with varicosity or small subcentimeter AVM.  No surrounding inflammatory changes, no signs of infection.  There is a pinpoint abrasion at the inferior margin of the lesion which is the apparent source of bleeding but is now hemostatic.   This was dressed with 1 Steri-Strip.   ED Results / Procedures / Treatments   Labs (all labs ordered are listed, but only abnormal results are displayed) Labs Reviewed - No data to display   RADIOLOGY    PROCEDURES:  Procedures   MEDICATIONS ORDERED IN ED: Medications - No data to display   IMPRESSION / MDM / ASSESSMENT AND PLAN / ED COURSE  I reviewed the triage vital signs and the nursing notes.                              Patient presents with facial abrasion which had increased bleeding due to his anticoagulant therapy.  Bleeding is now stopped, and 1 Steri-Strip was applied to help stabilize the abrasion and prevent recurrent bleeding.  No other needs at this time       FINAL CLINICAL IMPRESSION(S) / ED DIAGNOSES   Final diagnoses:  Abrasion of face, initial encounter     Rx / DC Orders  ED Discharge Orders     None        Note:  This document was prepared using Dragon voice recognition software and may include unintentional dictation errors.   Sharman Cheek, MD 04/29/22 [phone]

## 2022-04-29 NOTE — ED Triage Notes (Signed)
Pt to ED from home c/o left cheek bleeding tonight.   States had spot on left cheek for 4 months, has seen dermatologist but wouldn't do anything for because insurance reasons.  Pt states spontaneous bleeding tonight.  Bandage in place in triage.

## 2022-06-16 ENCOUNTER — Encounter (INDEPENDENT_AMBULATORY_CARE_PROVIDER_SITE_OTHER): Payer: Self-pay

## 2022-06-30 ENCOUNTER — Ambulatory Visit (INDEPENDENT_AMBULATORY_CARE_PROVIDER_SITE_OTHER): Payer: BC Managed Care – PPO

## 2022-06-30 ENCOUNTER — Encounter: Payer: Self-pay | Admitting: Emergency Medicine

## 2022-06-30 ENCOUNTER — Ambulatory Visit
Admission: EM | Admit: 2022-06-30 | Discharge: 2022-06-30 | Disposition: A | Discharge status: Home / Self Care | Payer: BC Managed Care – PPO | Attending: Emergency Medicine | Admitting: Emergency Medicine

## 2022-06-30 DIAGNOSIS — R1030 Lower abdominal pain, unspecified: Secondary | ICD-10-CM | POA: Insufficient documentation

## 2022-06-30 DIAGNOSIS — K5900 Constipation, unspecified: Secondary | ICD-10-CM | POA: Insufficient documentation

## 2022-06-30 LAB — URINALYSIS, ROUTINE W REFLEX MICROSCOPIC
Glucose, UA: >1000 mg/dL — AB
Hgb urine dipstick: NEGATIVE
Ketones, ur: 15 mg/dL — AB
Leukocytes,Ua: NEGATIVE
Nitrite: NEGATIVE
Protein, ur: NEGATIVE mg/dL
Specific Gravity, Urine: 1.02 (ref 1.005–1.030)
pH: 5 (ref 5.0–8.0)

## 2022-06-30 LAB — COMPREHENSIVE METABOLIC PANEL
ALT: 16 U/L (ref 0–44)
AST: 16 U/L (ref 15–41)
Albumin: 4.3 g/dL (ref 3.5–5.0)
Alkaline Phosphatase: 94 U/L (ref 38–126)
Anion gap: 6 (ref 5–15)
BUN: 13 mg/dL (ref 6–20)
CO2: 23 mmol/L (ref 22–32)
Calcium: 8.9 mg/dL (ref 8.9–10.3)
Chloride: 105 mmol/L (ref 98–111)
Creatinine, Ser: 0.79 mg/dL (ref 0.61–1.24)
GFR, Estimated: >60 mL/min (ref >60)
Glucose, Bld: 261 mg/dL — ABNORMAL HIGH (ref 70–99)
Potassium: 3.9 mmol/L (ref 3.5–5.1)
Sodium: 134 mmol/L — ABNORMAL LOW (ref 135–145)
Total Bilirubin: 0.8 mg/dL (ref 0.3–1.2)
Total Protein: 8.3 g/dL — ABNORMAL HIGH (ref 6.5–8.1)

## 2022-06-30 LAB — CBC WITH DIFFERENTIAL/PLATELET
Abs Immature Granulocytes: 0.03 10*3/uL (ref 0.00–0.07)
Basophils Absolute: 0.1 10*3/uL (ref 0.0–0.1)
Basophils Relative: 1 %
Eosinophils Absolute: 0.1 10*3/uL (ref 0.0–0.5)
Eosinophils Relative: 1 %
HCT: 45.7 % (ref 39.0–52.0)
Hemoglobin: 15.8 g/dL (ref 13.0–17.0)
Immature Granulocytes: 0 %
Lymphocytes Relative: 10 %
Lymphs Abs: 1.1 10*3/uL (ref 0.7–4.0)
MCH: 30.3 pg (ref 26.0–34.0)
MCHC: 34.6 g/dL (ref 30.0–36.0)
MCV: 87.7 fL (ref 80.0–100.0)
Monocytes Absolute: 0.6 10*3/uL (ref 0.1–1.0)
Monocytes Relative: 5 %
Neutro Abs: 9.2 10*3/uL — ABNORMAL HIGH (ref 1.7–7.7)
Neutrophils Relative %: 83 %
Platelets: 257 10*3/uL (ref 150–400)
RBC: 5.21 MIL/uL (ref 4.22–5.81)
RDW: 13.9 % (ref 11.5–15.5)
WBC: 11 10*3/uL — ABNORMAL HIGH (ref 4.0–10.5)
nRBC: 0 % (ref 0.0–0.2)

## 2022-06-30 NOTE — ED Triage Notes (Signed)
Pt presents with lower abdominal pain and fatigue x 1 week. Pt states he had a bowel movement 2 days ago but still feels like he is backed up.

## 2022-06-30 NOTE — Discharge Instructions (Signed)
Try MiraLAX, Metamucil, or the psyllium husk  supplement pinch.  You need to be drinking 2 L of water a day.  Increase your fruit and vegetable intake.  It may take up to 3 days for the miralax to take effect. may also drink prune and apple juice. Return to the ER if you have a fever, if you start having severe abdominal pain, blood in your stool, a fever >100.4, or any other concerns.  Follow-up with your gastroenterologist if your symptoms do not resolve in a day or 2.   Go to www.goodrx.com  or www.costplusdrugs.com to look up your medications. This will give you a list of where you can find your prescriptions at the most affordable prices. Or ask the pharmacist what the cash price is, or if they have any other discount programs available to help make your medication more affordable. This can be less expensive than what you would pay with insurance.

## 2022-06-30 NOTE — ED Provider Notes (Signed)
HPI  SUBJECTIVE:  Justin Adkins is a 56 y.o. male who presents with 1 week of constipation and intermittent, bilateral, nonradiating, nonmigratory dull lower abdominal pain.  This lasts about 10 minutes and then resolves.  He took MiraLAX for 3 days and had a small, soft, normal colored, nonpainful bowel movement yesterday.  Denies melena, hematochezia, bright red blood per rectum, urinary complaints, nausea, vomiting, fevers, GU complaints, back pain, abdominal distention.  He has not tried anything for his abdominal pain.  It resolves on its own.  No aggravating factors. The pain is not getting worse, but it is not resolving.  It is not associated with eating, urination, defecation, movement, or positional changes.  He drinks about 24 ounces of water a day.  No recent medication changes.  He has not tried anything other than the MiraLAX for 3 days for the constipation.  No aggravating factors.  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  He has had issues with constipation in the past.  No history of gastroparesis, diverticulitis, UTI, nephrolithiasis, atrial fibrillation, mesenteric ischemia.  PCP: [facility] [place].   GI: [facility]  Past Medical History:  Diagnosis Date   Diabetes mellitus without complication (HCC)    GERD (gastroesophageal reflux disease)    History of echocardiogram    a. 07/2020 Echo: EF 60-65%, no rwma, mild LVH. Nl RV fxn. Mildly dil LA.   History of tobacco abuse    Hyperlipidemia    Peripheral vascular disease (HCC)    a. 05/2019 s/p PTA R popliteal/AT, mech thrombectomy to R popliteal/tibioperoneal trunk--> plavix & eliquis 2.5 bid since; 09/2019 LE u/s: near nl study.    Past Surgical History:  Procedure Laterality Date   COLONOSCOPY     COLONOSCOPY N/A 09/24/2020    Procedure: COLONOSCOPY;  Surgeon: Regis Bill, MD;  Location: [facility] ENDOSCOPY;  Service: Endoscopy;  Laterality: N/A;   ESOPHAGOGASTRODUODENOSCOPY (EGD) WITH PROPOFOL N/A 02/28/2020   Procedure: ESOPHAGOGASTRODUODENOSCOPY (EGD) WITH PROPOFOL;  Surgeon: Regis Bill, MD;  Location: [facility] ENDOSCOPY;  Service: Endoscopy;  Laterality: N/A;   FOOT SURGERY Right    HERNIA REPAIR     LOWER EXTREMITY ANGIOGRAPHY Right 05/25/2019   Procedure: LOWER EXTREMITY ANGIOGRAPHY;  Surgeon: Renford Dills, MD;  Location: [facility] INVASIVE CV LAB;  Service: Cardiovascular;  Laterality: Right;   LOWER EXTREMITY ANGIOGRAPHY Right 07/02/2021   Procedure: LOWER EXTREMITY ANGIOGRAPHY;  Surgeon: Renford Dills, MD;  Location: [facility] INVASIVE CV LAB;  Service: Cardiovascular;  Laterality: Right;   TONSILLECTOMY     VASCULAR SURGERY      Family History  Problem Relation Age of Onset   Cancer Father    Heart attack Brother        MI @ 39    Social History   Tobacco Use   Smoking status: Former    Packs/day: 1.00    Years: 35.00    Total pack years: 35.00    Types: Cigarettes   Smokeless tobacco: Never  Vaping Use   Vaping Use: Never used  Substance Use Topics   Alcohol use: Yes    Comment: rarely   Drug use: No    No current facility-administered medications for this encounter.  Current Outpatient Medications:    atorvastatin (LIPITOR) 20 MG tablet, Take 1 tablet (20 mg total) by mouth daily., Disp:  30 tablet, Rfl: 2   clindamycin (CLEOCIN T) 1 % lotion, Apply topically 2 (two) times daily., Disp: , Rfl:    clopidogrel (PLAVIX) 75 MG tablet, TAKE 1 TABLET BY MOUTH EVERY DAY, Disp: 90 tablet, Rfl: 3   clotrimazole-betamethasone (LOTRISONE) cream, Apply topically 2 (two) times daily., Disp: , Rfl:    docusate sodium (COLACE) 100 MG capsule, Take 100 mg by mouth 2 (two) times daily as needed for mild constipation., Disp: , Rfl:    ergocalciferol (VITAMIN D2) 1.25 MG (50000 UT) capsule, Take  50,000 Units by mouth every Saturday., Disp: , Rfl:    FARXIGA 10 MG TABS tablet, Take 10 mg by mouth daily., Disp: , Rfl:    ketoconazole (NIZORAL) 2 % shampoo, Apply topically., Disp: , Rfl:    mesalamine (LIALDA) 1.2 g EC tablet, Take 4.8 g by mouth daily with breakfast., Disp: , Rfl:    NOVOLIN N 100 UNIT/ML injection, Inject 30 Units into the skin 2 (two) times daily before a meal., Disp: , Rfl:    ondansetron (ZOFRAN-ODT) 4 MG disintegrating tablet, Take by mouth., Disp: , Rfl:    OZEMPIC, 0.25 OR 0.5 MG/DOSE, 2 MG/1.5ML SOPN, Inject 0.5 mg into the skin every Saturday., Disp: , Rfl:    pantoprazole (PROTONIX) 40 MG tablet, Take 40 mg by mouth 2 (two) times daily., Disp: , Rfl:    XARELTO 2.5 MG TABS tablet, TAKE 1 TABLET BY MOUTH TWICE A DAY, Disp: 60 tablet, Rfl: 6  Allergies  Allergen Reactions   Naproxen Anaphylaxis   Liraglutide Nausea Only    (Victoza)     ROS  As noted in HPI.   Physical Exam  BP 113/83 (BP Location: Left Arm)   Pulse 96   Temp 98.3 F (36.8 C) (Oral)   Resp 16   SpO2 95%   Constitutional: Well developed, well nourished, no acute distress Eyes:  EOMI, conjunctiva normal bilaterally HENT: Normocephalic, atraumatic,mucus membranes moist Respiratory: Normal inspiratory effort Cardiovascular: Normal rate GI: Normal appearance.  Soft, nontender, nondistended, no rebound, guarding.  Negative Murphy, negative McBurney. Back: No CVAT GU: Normal circumcised male, testes descended bilaterally.  No epididymal's, testicular swelling or tenderness.  No inguinal hernia appreciated.  Patient declined chaperone Rectal: Normal external appearance, good rectal tone, soft stool in vault.  Prostate normal.  Patient declined chaperone. skin: No rash, skin intact Musculoskeletal: no deformities Neurologic: Alert & oriented x 3, no focal neuro deficits Psychiatric: Speech and behavior appropriate   ED Course   Medications - No data to display  Orders Placed  This Encounter  Procedures   DG Abd Acute W/Chest    Standing Status:   Standing    Number of Occurrences:   1    Order Specific Question:   Reason for Exam (SYMPTOM  OR DIAGNOSIS REQUIRED)    Answer:   constipation bilateral low abd pain r/o perforation, obstruction   CBC with Differential    Standing Status:   Standing    Number of Occurrences:   1   Comprehensive metabolic panel    Standing Status:   Standing    Number of Occurrences:   1   Urinalysis, Routine w reflex microscopic Urine, Clean Catch    Standing Status:   Standing    Number of Occurrences:   1    Results for orders placed or performed during the hospital encounter of 06/30/22 (from the past 24 hour(s))  CBC with Differential     Status: Abnormal  Collection Time: 06/30/22  4:10 PM  Result Value Ref Range   WBC 11.0 (H) 4.0 - 10.5 K/uL   RBC 5.21 4.22 - 5.81 MIL/uL   Hemoglobin 15.8 13.0 - 17.0 g/dL   HCT 03.8 88.2 - 80.0 %   MCV 87.7 80.0 - 100.0 fL   MCH 30.3 26.0 - 34.0 pg   MCHC 34.6 30.0 - 36.0 g/dL   RDW 34.9 17.9 - 15.0 %   Platelets 257 150 - 400 K/uL   nRBC 0.0 0.0 - 0.2 %   Neutrophils Relative % 83 %   Neutro Abs 9.2 (H) 1.7 - 7.7 K/uL   Lymphocytes Relative 10 %   Lymphs Abs 1.1 0.7 - 4.0 K/uL   Monocytes Relative 5 %   Monocytes Absolute 0.6 0.1 - 1.0 K/uL   Eosinophils Relative 1 %   Eosinophils Absolute 0.1 0.0 - 0.5 K/uL   Basophils Relative 1 %   Basophils Absolute 0.1 0.0 - 0.1 K/uL   Immature Granulocytes 0 %   Abs Immature Granulocytes 0.03 0.00 - 0.07 K/uL  Comprehensive metabolic panel     Status: Abnormal   Collection Time: 06/30/22  4:10 PM  Result Value Ref Range   Sodium 134 (L) 135 - 145 mmol/L   Potassium 3.9 3.5 - 5.1 mmol/L   Chloride 105 98 - 111 mmol/L   CO2 23 22 - 32 mmol/L   Glucose, Bld 261 (H) 70 - 99 mg/dL   BUN 13 6 - 20 mg/dL   Creatinine, Ser 5.69 0.61 - 1.24 mg/dL   Calcium 8.9 8.9 - 79.4 mg/dL   Total Protein 8.3 (H) 6.5 - 8.1 g/dL   Albumin 4.3 3.5  - 5.0 g/dL   AST 16 15 - 41 U/L   ALT 16 0 - 44 U/L   Alkaline Phosphatase 94 38 - 126 U/L   Total Bilirubin 0.8 0.3 - 1.2 mg/dL   GFR, Estimated >80 >16 mL/min   Anion gap 6 5 - 15  Urinalysis, Routine w reflex microscopic Urine, Clean Catch     Status: Abnormal   Collection Time: 06/30/22  4:10 PM  Result Value Ref Range   Color, Urine YELLOW YELLOW   APPearance CLEAR CLEAR   Specific Gravity, Urine 1.020 1.005 - 1.030   pH 5.0 5.0 - 8.0   Glucose, UA >1,000 (A) NEGATIVE mg/dL   Hgb urine dipstick NEGATIVE NEGATIVE   Bilirubin Urine SMALL (A) NEGATIVE   Ketones, ur 15 (A) NEGATIVE mg/dL   Protein, ur NEGATIVE NEGATIVE mg/dL   Nitrite NEGATIVE NEGATIVE   Leukocytes,Ua NEGATIVE NEGATIVE   DG Abd Acute W/Chest  Result Date: 06/30/2022 CLINICAL DATA:  Lower abdominal pain for the past week. EXAM: DG ABDOMEN ACUTE WITH 1 VIEW CHEST COMPARISON:  Chest x-ray dated July 24, 2020. Abdominal x-ray dated November 01, 2019. FINDINGS: There is no evidence of dilated bowel loops or free intraperitoneal air. No radiopaque calculi or other significant radiographic abnormality is seen. Heart size and mediastinal contours are within normal limits. Both lungs are clear. IMPRESSION: Negative abdominal radiographs.  No acute cardiopulmonary disease. Electronically Signed   By: Obie Dredge M.D.   On: 06/30/2022 16:40    ED Clinical Impression  1. Lower abdominal pain   2. Constipation, unspecified constipation type      ED Assessment/Plan    Outside records, labs extensively reviewed.  Additional medical history obtained.  As noted in HPI.  Patient with constipation and bilateral, intermittent lower abdominal pain.  He has a history of ulcerative colitis, proctitis and is on mesalamine.  I suspect abdominal pain is from constipation.  Bilateral diverticulitis would be unusual.  Could be an ulcerative colitis flare.  His prostate is normal, nontender.  His abdomen is benign here, however, will  check a CBC, CMP, UA and acute abdominal series.  The fatigue noted in triage has been going on for 5 months and has not changed recently.  He will address this with his primary care provider next month.  Pt abd exam is benign, no peritoneal signs. No evidence of surgical abd. Doubt SBO, mesenteric ischemia, appendicitis, hepatitis, cholecystitis, pancreatitis, or perforated viscus. No evidence to suggest testicular source for abdominal pain.   Reviewed imaging independently.  No air-fluid levels.  No dilated bowel loops, free air, nephrolithiasis.  Normal acute abdominal series.  See radiology report for full details.  Patient has glucosuria, but is on Farxiga.  He was some ketones in his urine, but no anion gap, acidosis.  He does not have a UTI.  His glucose is 261.  No concern for DKA at this time.  Borderline elevated leukocytosis, but do not think that this is clinically significant at this time.  Presentation consistent with abdominal pain from constipation.  Advise increased fluid, fiber intake, advised Metamucil or psyllium husk supplements.  He will follow-up with GI if symptoms persist.  ER return precautions given.  Work note.  Discussed labs, imaging, MDM, treatment plan, and plan for follow-up with patient. Discussed sn/sx that should prompt return to the ED. patient agrees with plan.   No orders of the defined types were placed in this encounter.     *This clinic note was created using Dragon dictation software. Therefore, there may be occasional mistakes despite careful proofreading.  ?    Domenick Gong, MD 07/01/22 [phone]

## 2022-07-16 ENCOUNTER — Other Ambulatory Visit: Payer: Self-pay

## 2022-07-16 ENCOUNTER — Emergency Department: Payer: BC Managed Care – PPO

## 2022-07-16 ENCOUNTER — Emergency Department
Admission: EM | Admit: 2022-07-16 | Discharge: 2022-07-16 | Disposition: A | Discharge status: Home / Self Care | Payer: BC Managed Care – PPO | Attending: Emergency Medicine | Admitting: Emergency Medicine

## 2022-07-16 DIAGNOSIS — K625 Hemorrhage of anus and rectum: Secondary | ICD-10-CM | POA: Insufficient documentation

## 2022-07-16 DIAGNOSIS — I10 Essential (primary) hypertension: Secondary | ICD-10-CM | POA: Diagnosis not present

## 2022-07-16 DIAGNOSIS — K529 Noninfective gastroenteritis and colitis, unspecified: Secondary | ICD-10-CM | POA: Insufficient documentation

## 2022-07-16 DIAGNOSIS — Z7901 Long term (current) use of anticoagulants: Secondary | ICD-10-CM | POA: Diagnosis not present

## 2022-07-16 DIAGNOSIS — E119 Type 2 diabetes mellitus without complications: Secondary | ICD-10-CM | POA: Insufficient documentation

## 2022-07-16 LAB — COMPREHENSIVE METABOLIC PANEL
ALT: 12 U/L (ref 0–44)
AST: 12 U/L — ABNORMAL LOW (ref 15–41)
Albumin: 3.9 g/dL (ref 3.5–5.0)
Alkaline Phosphatase: 98 U/L (ref 38–126)
Anion gap: 9 (ref 5–15)
BUN: 13 mg/dL (ref 6–20)
CO2: 25 mmol/L (ref 22–32)
Calcium: 8.4 mg/dL — ABNORMAL LOW (ref 8.9–10.3)
Chloride: 109 mmol/L (ref 98–111)
Creatinine, Ser: 0.8 mg/dL (ref 0.61–1.24)
GFR, Estimated: >60 mL/min (ref >60)
Glucose, Bld: 203 mg/dL — ABNORMAL HIGH (ref 70–99)
Potassium: 3.8 mmol/L (ref 3.5–5.1)
Sodium: 143 mmol/L (ref 135–145)
Total Bilirubin: 1.5 mg/dL — ABNORMAL HIGH (ref 0.3–1.2)
Total Protein: 7.4 g/dL (ref 6.5–8.1)

## 2022-07-16 LAB — CBC
HCT: 48.3 % (ref 39.0–52.0)
Hemoglobin: 15.9 g/dL (ref 13.0–17.0)
MCH: 29.8 pg (ref 26.0–34.0)
MCHC: 32.9 g/dL (ref 30.0–36.0)
MCV: 90.4 fL (ref 80.0–100.0)
Platelets: 295 10*3/uL (ref 150–400)
RBC: 5.34 MIL/uL (ref 4.22–5.81)
RDW: 13.5 % (ref 11.5–15.5)
WBC: 8.8 10*3/uL (ref 4.0–10.5)
nRBC: 0 % (ref 0.0–0.2)

## 2022-07-16 LAB — TYPE AND SCREEN
ABO/RH(D): A POS
Antibody Screen: NEGATIVE

## 2022-07-16 LAB — PROTIME-INR
INR: 1 (ref 0.8–1.2)
Prothrombin Time: 13.5 seconds (ref 11.4–15.2)

## 2022-07-16 MED ORDER — IOHEXOL 300 MG/ML  SOLN
100.0000 mL | Freq: Once | INTRAMUSCULAR | Status: AC | PRN
  Administered 2022-07-16: 100 mL via INTRAVENOUS

## 2022-07-16 NOTE — ED Notes (Signed)
Patient transported to CT 

## 2022-07-16 NOTE — ED Notes (Addendum)
Pt to ED for bright red rectal bleeding that started on Monday after he had been away for Thanksgiving and gotten constipated and taken a Colace on (Friday). Pt also complains of lower abdominal cramping. Denies dizziness. No other complaints. Ambulatory with steady gait.

## 2022-07-16 NOTE — ED Triage Notes (Signed)
Pt here with rectal bleeding that started Monday night. Pt states he is also having lower abd pain, blood is bright red in color. Pt also having weakness. Pt ambulatory to triage.

## 2022-07-16 NOTE — ED Provider Notes (Signed)
[facility] Provider Note    Event Date/Time   First MD Initiated Contact with Patient 07/16/22 1504     (approximate)   History   Chief Complaint Rectal Bleeding   HPI  Justin Adkins is a 56 y.o. male with past medical history of hypertension, hyperlipidemia, diabetes, and PAD on Xarelto who presents to the ED complaining of rectal bleeding.  Patient reports that he has been dealing with constant bilateral lower quadrant abdominal pain for about the past week.  He was initially seen at urgent care and had an x-ray performed, was subsequently told his symptoms were due to constipation.  He states he has been taking a stool softener since then but developed blood in his stool 2 days ago.  He reports bright red blood mixed with light brown stool, also notices bright red blood when he wipes.  He continues to have crampy pain in the bilateral lower quadrants of his abdomen, denies any nausea or vomiting.  He has not had any fevers, dysuria, hematuria, or flank pain.  He reports being seen for rectal bleeding last year by GI, was diagnosed with ulcerative proctitis at that time and continues to take mesalamine.     Physical Exam   Triage Vital Signs: ED Triage Vitals  Enc Vitals Group     BP 07/16/22 1144 (!) 125/92     Pulse Rate 07/16/22 1144 (!) 105     Resp 07/16/22 1144 18     Temp 07/16/22 1144 (!) 97.5 F (36.4 C)     Temp Source 07/16/22 1144 Oral     SpO2 07/16/22 1144 97 %     Weight 07/16/22 1145 184 lb 15.5 oz (83.9 kg)     Height 07/16/22 1145 5\' 8"  (1.727 m)     Head Circumference --      Peak Flow --      Pain Score 07/16/22 1145 8     Pain Loc --      Pain Edu? --      Excl. in GC? --     Most recent vital signs: Vitals:   07/16/22 1730 07/16/22 1746  BP: 133/77   Pulse: 88   Resp: 15   Temp:  98 F (36.7 C)  SpO2: 98%     Constitutional: Alert and oriented. Eyes: Conjunctivae are normal. Head: Atraumatic. Nose: No  congestion/rhinnorhea. Mouth/Throat: Mucous membranes are moist.  Cardiovascular: Normal rate, regular rhythm. Grossly normal heart sounds.  2+ radial pulses bilaterally. Respiratory: Normal respiratory effort.  No retractions. Lungs CTAB. Gastrointestinal: Soft and tender to palpation in the left lower quadrant with no rebound or guarding. No distention.  Rectal exam with nonbleeding hemorrhoids, stool is light brown and guaiac negative. Musculoskeletal: No lower extremity tenderness nor edema.  Neurologic:  Normal speech and language. No gross focal neurologic deficits are appreciated.    ED Results / Procedures / Treatments   Labs (all labs ordered are listed, but only abnormal results are displayed) Labs Reviewed  COMPREHENSIVE METABOLIC PANEL - Abnormal; Notable for the following components:      Result Value   Glucose, Bld 203 (*)    Calcium 8.4 (*)    AST 12 (*)    Total Bilirubin 1.5 (*)    All other components within normal limits  CBC  PROTIME-INR  POC OCCULT BLOOD, ED  TYPE AND SCREEN     EKG  ED ECG REPORT I, 07/18/22, the attending physician, personally viewed and  interpreted this ECG.   Date: 07/16/2022  EKG Time: 11:46  Rate: 102  Rhythm: sinus tachycardia  Axis: Normal  Intervals:none  ST&T Change: None  RADIOLOGY CT abdomen/pelvis reviewed and interpreted by me with inflammatory changes in the area of the colon, no dilated bowel loops or focal fluid collections noted.  PROCEDURES:  Critical Care performed: No  Procedures   MEDICATIONS ORDERED IN ED: Medications  iohexol (OMNIPAQUE) 300 MG/ML solution 100 mL (100 mLs Intravenous Contrast Given 07/16/22 1752)     IMPRESSION / MDM / ASSESSMENT AND PLAN / ED COURSE  I reviewed the triage vital signs and the nursing notes.                              56 y.o. male with past medical history of hypertension, hyperlipidemia, diabetes, and PAD on Xarelto who presents to the ED with 1 week  of bilateral lower quadrant abdominal pain now with rectal bleeding over the past 2 days.  Patient's presentation is most consistent with acute presentation with potential threat to life or bodily function.  Differential diagnosis includes, but is not limited to, upper GI bleed, lower GI bleed, hemorrhoids, recurrent proctitis, anemia, electrolyte abnormality, AKI, diverticulitis, colitis.  Patient well-appearing and in no acute distress, vital signs are unremarkable.  He does have tenderness to the left lower quadrant of his abdomen and we will further assess with CT imaging.  Labs thus far are reassuring with no significant anemia and I doubt significant GI bleeding.  Patient does report a history of hemorrhoids and rectal exam with nonbleeding hemorrhoids, stool is guaiac negative.  Additional labs are remarkable for mild hyperglycemia with no evidence of DKA, LFTs are unremarkable and no significant leukocytosis noted.  CT abdomen/pelvis consistent with colitis, infectious or inflammatory in origin.  Findings reviewed with Dr. Mia Creek of GI, who advises against a course of steroids, recommends patient continues mesalamine and and follows up in the office.  Patient with no further bleeding here in the ED and remains hemodynamically stable.  He was counseled to return to the ED for new or worsening symptoms, patient agrees with plan.      FINAL CLINICAL IMPRESSION(S) / ED DIAGNOSES   Final diagnoses:  Rectal bleeding  Colitis     Rx / DC Orders   ED Discharge Orders     None        Note:  This document was prepared using Dragon voice recognition software and may include unintentional dictation errors.   Chesley Noon, MD 07/16/22 [phone]

## 2022-08-12 ENCOUNTER — Ambulatory Visit
Admission: EM | Admit: 2022-08-12 | Discharge: 2022-08-12 | Disposition: A | Discharge status: Home / Self Care | Payer: BC Managed Care – PPO | Attending: Internal Medicine | Admitting: Internal Medicine

## 2022-08-12 DIAGNOSIS — L03011 Cellulitis of right finger: Secondary | ICD-10-CM | POA: Diagnosis not present

## 2022-08-12 MED ORDER — DOXYCYCLINE HYCLATE 100 MG PO CAPS: 100.0000 mg | ORAL_CAPSULE | Freq: Two times a day (BID) | ORAL | 0 refills | Status: DC

## 2022-08-12 NOTE — ED Provider Notes (Signed)
[facility]    CSN: [id number] Arrival date & time: 08/12/22  1025      History   Chief Complaint Chief Complaint  Patient presents with   Finger Injury    HPI Justin Adkins is a 55 y.o. male who presents due to having swelling and redness of corder of R ring finger since yesterday and denies an injury. Has had this before on the opposite hand but was worse and had to have I&D done.     Past Medical History:  Diagnosis Date   Diabetes mellitus without complication (HCC)    GERD (gastroesophageal reflux disease)    History of echocardiogram    a. 07/2020 Echo: EF 60-65%, no rwma, mild LVH. Nl RV fxn. Mildly dil LA.   History of tobacco abuse    Hyperlipidemia    Peripheral vascular disease (HCC)    a. 05/2019 s/p PTA R popliteal/AT, mech thrombectomy to R popliteal/tibioperoneal trunk--> plavix & eliquis 2.5 bid since; 09/2019 LE u/s: near nl study.    Patient Active Problem List   Diagnosis Date Noted   Atherosclerosis of native arteries of extremity with intermittent claudication (HCC) 06/09/2021   Essential hypertension 06/09/2021   Acute hepatitis 07/24/2020   PVD (peripheral vascular disease) (HCC) 05/31/2019   Popliteal artery occlusion, right (HCC) 05/20/2019   Atherosclerosis of native arteries of extremity with rest pain (HCC) 05/19/2019   Hyperlipidemia 05/19/2019   Hyperlipidemia associated with type 2 diabetes mellitus (HCC) 05/19/2019   Type 2 diabetes mellitus with peripheral neuropathy (HCC) 01/05/2018   Other male erectile dysfunction 01/05/2018   Total or mature cataract 03/05/2017   Tobacco abuse 05/03/2013    Past Surgical History:  Procedure Laterality Date   COLONOSCOPY     COLONOSCOPY N/A 09/24/2020   Procedure: COLONOSCOPY;  Surgeon: Regis Bill, MD;  Location: [facility];  Service: Endoscopy;  Laterality: N/A;   ESOPHAGOGASTRODUODENOSCOPY (EGD) WITH PROPOFOL N/A 02/28/2020   Procedure: ESOPHAGOGASTRODUODENOSCOPY (EGD)  WITH PROPOFOL;  Surgeon: Regis Bill, MD;  Location: [facility];  Service: Endoscopy;  Laterality: N/A;   FOOT SURGERY Right    HERNIA REPAIR     LOWER EXTREMITY ANGIOGRAPHY Right 05/25/2019   Procedure: LOWER EXTREMITY ANGIOGRAPHY;  Surgeon: Renford Dills, MD;  Location: [facility];  Service: Cardiovascular;  Laterality: Right;   LOWER EXTREMITY ANGIOGRAPHY Right 07/02/2021   Procedure: LOWER EXTREMITY ANGIOGRAPHY;  Surgeon: Renford Dills, MD;  Location: [facility];  Service: Cardiovascular;  Laterality: Right;   TONSILLECTOMY     VASCULAR SURGERY         Home Medications    Prior to Admission medications   Medication Sig Start Date End Date Taking? Authorizing Provider  atorvastatin (LIPITOR) 20 MG tablet Take 1 tablet (20 mg total) by mouth daily. 03/11/21 06/28/26 Yes Agbor-Etang, Arlys John, MD  clindamycin (CLEOCIN T) 1 % lotion Apply topically 2 (two) times daily. 11/18/21  Yes [provider]  clopidogrel (PLAVIX) 75 MG tablet TAKE 1 TABLET BY MOUTH EVERY DAY 06/07/20  Yes Schnier, Latina Craver, MD  clotrimazole-betamethasone (LOTRISONE) cream Apply topically 2 (two) times daily. 12/19/21  Yes [provider]  docusate sodium (COLACE) 100 MG capsule Take 100 mg by mouth 2 (two) times daily as needed for mild constipation.   Yes [provider]  doxycycline (VIBRAMYCIN) 100 MG capsule Take 1 capsule (100 mg total) by mouth 2 (two) times daily. 08/12/22  Yes Rodriguez-Southworth, Nettie Elm, PA-C  ergocalciferol (VITAMIN D2) 1.25  MG (50000 UT) capsule Take 50,000 Units by mouth every Saturday.   Yes [provider]  FARXIGA 10 MG TABS tablet Take 10 mg by mouth daily. 07/03/20  Yes [provider]  ketoconazole (NIZORAL) 2 % shampoo Apply topically. 01/19/22  Yes [provider]  mesalamine (LIALDA) 1.2 g EC tablet Take 4.8 g by mouth daily with breakfast. 10/01/20 08/12/22 Yes [provider]   NOVOLIN N 100 UNIT/ML injection Inject 30 Units into the skin 2 (two) times daily before a meal. 05/27/19  Yes [provider]  ondansetron (ZOFRAN-ODT) 4 MG disintegrating tablet Take by mouth. 08/28/21  Yes [provider]  OZEMPIC, 0.25 OR 0.5 MG/DOSE, 2 MG/1.5ML SOPN Inject 0.5 mg into the skin every Saturday. 07/04/20  Yes [provider]  pantoprazole (PROTONIX) 40 MG tablet Take 40 mg by mouth 2 (two) times daily. 07/03/20  Yes [provider]  XARELTO 2.5 MG TABS tablet TAKE 1 TABLET BY MOUTH TWICE A DAY 07/15/21  Yes Georgiana Spinner, NP    Family History Family History  Problem Relation Age of Onset   Cancer Father    Heart attack Brother        MI @ 16    Social History Social History   Tobacco Use   Smoking status: Former    Packs/day: 1.00    Years: 35.00    Total pack years: 35.00    Types: Cigarettes   Smokeless tobacco: Never  Vaping Use   Vaping Use: Never used  Substance Use Topics   Alcohol use: Yes    Comment: rarely   Drug use: No     Allergies   Naproxen and Liraglutide   Review of Systems Review of Systems  Skin:  Positive for color change and wound. Negative for pallor and rash.     Physical Exam Triage Vital Signs ED Triage Vitals  Enc Vitals Group     BP 08/12/22 1154 105/69     Pulse Rate 08/12/22 1154 83     Resp 08/12/22 1154 16     Temp 08/12/22 1154 98.8 F (37.1 C)     Temp Source 08/12/22 1154 Oral     SpO2 08/12/22 1154 94 %     Weight 08/12/22 1153 165 lb (74.8 kg)     Height 08/12/22 1153 5\' 8"  (1.727 m)     Head Circumference --      Peak Flow --      Pain Score 08/12/22 1152 0     Pain Loc --      Pain Edu? --      Excl. in [place]? --    No data found.  Updated Vital Signs BP 105/69 (BP Location: Left Arm)   Pulse 83   Temp 98.8 F (37.1 C) (Oral)   Resp 16   Ht 5\' 8"  (1.727 m)   Wt 165 lb (74.8 kg)   SpO2 94%   BMI 25.09 kg/m   Visual Acuity Right Eye Distance:   Left  Eye Distance:   Bilateral Distance:    Right Eye Near:   Left Eye Near:    Bilateral Near:     Physical Exam Constitutional:      General: He is not in acute distress.    Appearance: He is normal weight. He is not toxic-appearing.  HENT:     Right Ear: External ear normal.     Left Ear: External ear normal.  Eyes:     General:  No scleral icterus.    Conjunctiva/sclera: Conjunctivae normal.  Pulmonary:     Effort: Pulmonary effort is normal.  Musculoskeletal:     Cervical back: Neck supple.  Skin:    Comments: R RING FINGER- corner with small area of pustule at the edge of the nail, there is erythema below it til the first joint line, but no streaking, There is warmth and tenderness.   Neurological:     Mental Status: He is alert and oriented to person, place, and time.     Gait: Gait normal.  Psychiatric:        Mood and Affect: Mood normal.        Behavior: Behavior normal.        Thought Content: Thought content normal.        Judgment: Judgment normal.      UC Treatments / Results  Labs (all labs ordered are listed, but only abnormal results are displayed) Labs Reviewed - No data to display  EKG   Radiology No results found.  Procedures Procedures (including critical care time)  Medications Ordered in UC Medications - No data to display  Initial Impression / Assessment and Plan / UC Course  I have reviewed the triage vital signs and the nursing notes.  R finger perionychia  I placed him on Doxy as noted. See instructions.   Final Clinical Impressions(s) / UC Diagnoses   Final diagnoses:  Paronychia of finger, right     Discharge Instructions      Soak your finger in Epson salts for 20 minutes 2-3 times a day for 4 days     ED Prescriptions     Medication Sig Dispense Auth. Provider   doxycycline (VIBRAMYCIN) 100 MG capsule Take 1 capsule (100 mg total) by mouth 2 (two) times daily. 20 capsule Rodriguez-Southworth, Nettie Elm, PA-C       PDMP not reviewed this encounter.   Garey Ham, PA-C 08/12/22 1206

## 2022-08-12 NOTE — ED Triage Notes (Signed)
Pt c/o R ring finger being swollen x1 day. Denies any trauma or injury.

## 2022-08-12 NOTE — Discharge Instructions (Signed)
Soak your finger in Epson salts for 20 minutes 2-3 times a day for 4 days

## 2022-08-15 ENCOUNTER — Encounter: Payer: Self-pay | Admitting: Emergency Medicine

## 2022-08-15 ENCOUNTER — Ambulatory Visit
Admission: EM | Admit: 2022-08-15 | Discharge: 2022-08-15 | Disposition: A | Discharge status: Home / Self Care | Payer: BC Managed Care – PPO | Attending: Emergency Medicine | Admitting: Emergency Medicine

## 2022-08-15 DIAGNOSIS — L03011 Cellulitis of right finger: Secondary | ICD-10-CM

## 2022-08-15 MED ORDER — AMOXICILLIN-POT CLAVULANATE 875-125 MG PO TABS: 1.0000 | ORAL_TABLET | Freq: Two times a day (BID) | ORAL | 0 refills | Status: AC

## 2022-08-15 NOTE — ED Triage Notes (Signed)
Patient reports redness, pain and swelling in the fingertip of his right 4th finger that has been going on for the past 4 days.

## 2022-08-15 NOTE — Discharge Instructions (Signed)
Take the Augmentin twice daily with food for 7 days.  Stop taking the Doxycyline.  Soak your finger in warm water and Epsom salts 2-3 times a day to help facilitate drainage.  Keep a dressing on your finger until the drainage has stopped.  You can use over-the-counter Tylenol and ibuprofen according to the package instructions as needed for pain.  Return for reevaluation if you develop any increased redness, swelling, drainage, or red streaks going up your finger, or fever.

## 2022-08-15 NOTE — ED Provider Notes (Signed)
[facility]    CSN: [id number] Arrival date & time: 08/15/22  3016      History   Chief Complaint Chief Complaint  Patient presents with   Hand Pain    Right 4th finger    HPI Justin Adkins is a 56 y.o. male.   HPI  56 year old male here for evaluation of finger infection.  Patient reports that he has been experiencing pain, redness, and swelling to the tip of his right fourth finger for the last 4 days.  He was seen in this urgent care 4 days ago and was started on doxycycline 100 mg twice daily for treatment of a paronychia and was also advised to soak his finger in warm water.  He states that he has been doing both but the redness has continued to grow and now there is a visible pus pocket.  He denies any fever or drainage.  No red streaks going up his hand or arm.  Past Medical History:  Diagnosis Date   Diabetes mellitus without complication (HCC)    GERD (gastroesophageal reflux disease)    History of echocardiogram    a. 07/2020 Echo: EF 60-65%, no rwma, mild LVH. Nl RV fxn. Mildly dil LA.   History of tobacco abuse    Hyperlipidemia    Peripheral vascular disease (HCC)    a. 05/2019 s/p PTA R popliteal/AT, mech thrombectomy to R popliteal/tibioperoneal trunk--> plavix & eliquis 2.5 bid since; 09/2019 LE u/s: near nl study.    Patient Active Problem List   Diagnosis Date Noted   Atherosclerosis of native arteries of extremity with intermittent claudication (HCC) 06/09/2021   Essential hypertension 06/09/2021   Acute hepatitis 07/24/2020   PVD (peripheral vascular disease) (HCC) 05/31/2019   Popliteal artery occlusion, right (HCC) 05/20/2019   Atherosclerosis of native arteries of extremity with rest pain (HCC) 05/19/2019   Hyperlipidemia 05/19/2019   Hyperlipidemia associated with type 2 diabetes mellitus (HCC) 05/19/2019   Type 2 diabetes mellitus with peripheral neuropathy (HCC) 01/05/2018   Other male erectile dysfunction 01/05/2018   Total or  mature cataract 03/05/2017   Tobacco abuse 05/03/2013    Past Surgical History:  Procedure Laterality Date   COLONOSCOPY     COLONOSCOPY N/A 09/24/2020   Procedure: COLONOSCOPY;  Surgeon: Regis Bill, MD;  Location: [facility];  Service: Endoscopy;  Laterality: N/A;   ESOPHAGOGASTRODUODENOSCOPY (EGD) WITH PROPOFOL N/A 02/28/2020   Procedure: ESOPHAGOGASTRODUODENOSCOPY (EGD) WITH PROPOFOL;  Surgeon: Regis Bill, MD;  Location: [facility];  Service: Endoscopy;  Laterality: N/A;   FOOT SURGERY Right    HERNIA REPAIR     LOWER EXTREMITY ANGIOGRAPHY Right 05/25/2019   Procedure: LOWER EXTREMITY ANGIOGRAPHY;  Surgeon: Renford Dills, MD;  Location: [facility];  Service: Cardiovascular;  Laterality: Right;   LOWER EXTREMITY ANGIOGRAPHY Right 07/02/2021   Procedure: LOWER EXTREMITY ANGIOGRAPHY;  Surgeon: Renford Dills, MD;  Location: [facility];  Service: Cardiovascular;  Laterality: Right;   TONSILLECTOMY     VASCULAR SURGERY         Home Medications    Prior to Admission medications   Medication Sig Start Date End Date Taking? Authorizing Provider  amoxicillin-clavulanate (AUGMENTIN) 875-125 MG tablet Take 1 tablet by mouth every 12 (twelve) hours for 7 days. 08/15/22 08/22/22 Yes Becky Augusta, NP  atorvastatin (LIPITOR) 20 MG tablet Take 1 tablet (20 mg total) by mouth daily. 03/11/21 06/28/26  Debbe Odea, MD  clindamycin (CLEOCIN T) 1 % lotion  Apply topically 2 (two) times daily. 11/18/21   [provider]  clopidogrel (PLAVIX) 75 MG tablet TAKE 1 TABLET BY MOUTH EVERY DAY 06/07/20   Schnier, Dolores Lory, MD  clotrimazole-betamethasone (LOTRISONE) cream Apply topically 2 (two) times daily. 12/19/21   [provider]  docusate sodium (COLACE) 100 MG capsule Take 100 mg by mouth 2 (two) times daily as needed for mild constipation.    [provider]  ergocalciferol (VITAMIN D2) 1.25 MG (50000 UT) capsule Take  50,000 Units by mouth every Saturday.    [provider]  FARXIGA 10 MG TABS tablet Take 10 mg by mouth daily. 07/03/20   [provider]  ketoconazole (NIZORAL) 2 % shampoo Apply topically. 01/19/22   [provider]  mesalamine (LIALDA) 1.2 g EC tablet Take 4.8 g by mouth daily with breakfast. 10/01/20 08/12/22  [provider]  NOVOLIN N 100 UNIT/ML injection Inject 30 Units into the skin 2 (two) times daily before a meal. 05/27/19   [provider]  ondansetron (ZOFRAN-ODT) 4 MG disintegrating tablet Take by mouth. 08/28/21   [provider]  OZEMPIC, 0.25 OR 0.5 MG/DOSE, 2 MG/1.5ML SOPN Inject 0.5 mg into the skin every Saturday. 07/04/20   [provider]  pantoprazole (PROTONIX) 40 MG tablet Take 40 mg by mouth 2 (two) times daily. 07/03/20   [provider]  XARELTO 2.5 MG TABS tablet TAKE 1 TABLET BY MOUTH TWICE A DAY 07/15/21   Kris Hartmann, NP    Family History Family History  Problem Relation Age of Onset   Cancer Father    Heart attack Brother        MI @ 26    Social History Social History   Tobacco Use   Smoking status: Former    Packs/day: 1.00    Years: 35.00    Total pack years: 35.00    Types: Cigarettes   Smokeless tobacco: Never  Vaping Use   Vaping Use: Never used  Substance Use Topics   Alcohol use: Yes    Comment: rarely   Drug use: No     Allergies   Naproxen and Liraglutide   Review of Systems Review of Systems  Constitutional:  Negative for fever.  Musculoskeletal:  Positive for myalgias.       Redness and swelling to the tip of the right fourth finger.     Physical Exam Triage Vital Signs ED Triage Vitals  Enc Vitals Group     BP 08/15/22 1020 106/76     Pulse Rate 08/15/22 1020 84     Resp 08/15/22 1020 14     Temp 08/15/22 1020 97.7 F (36.5 C)     Temp Source 08/15/22 1020 Oral     SpO2 08/15/22 1020 97 %     Weight 08/15/22 1017 164 lb 14.5 oz (74.8 kg)      Height 08/15/22 1017 5\' 8"  (1.727 m)     Head Circumference --      Peak Flow --      Pain Score 08/15/22 1017 6     Pain Loc --      Pain Edu? --      Excl. in [place]? --    No data found.  Updated Vital Signs BP 106/76 (BP Location: Right Arm)   Pulse 84   Temp 97.7 F (36.5 C) (Oral)   Resp 14   Ht 5\' 8"  (1.727 m)   Wt 164 lb 14.5 oz (74.8 kg)  SpO2 97%   BMI 25.07 kg/m   Visual Acuity Right Eye Distance:   Left Eye Distance:   Bilateral Distance:    Right Eye Near:   Left Eye Near:    Bilateral Near:     Physical Exam Vitals and nursing note reviewed.  Constitutional:      Appearance: Normal appearance. He is not ill-appearing.  HENT:     Head: Normocephalic and atraumatic.  Musculoskeletal:        General: Swelling and tenderness present. No deformity or signs of injury. Normal range of motion.  Skin:    General: Skin is warm and dry.     Capillary Refill: Capillary refill takes less than 2 seconds.     Findings: Erythema present.  Neurological:     General: No focal deficit present.     Mental Status: He is alert and oriented to person, place, and time.  Psychiatric:        Mood and Affect: Mood normal.        Behavior: Behavior normal.        Thought Content: Thought content normal.        Judgment: Judgment normal.      UC Treatments / Results  Labs (all labs ordered are listed, but only abnormal results are displayed) Labs Reviewed - No data to display  EKG   Radiology No results found.  Procedures Procedures (including critical care time)  Medications Ordered in UC Medications - No data to display  Initial Impression / Assessment and Plan / UC Course  I have reviewed the triage vital signs and the nursing notes.  Pertinent labs & imaging results that were available during my care of the patient were reviewed by me and considered in my medical decision making (see chart for details).   Patient is a pleasant, nontoxic-appearing  56 year old male here for reevaluation of a paronychia on the right fourth finger that is been present for last 4 days and worsening.  He has been taking his doxycycline twice daily as prescribed and soaking his finger but states that it has continued to swell, become more red, and now there is visible pus without drainage.  He states that he does bite his fingernails that he thinks that may be what contributed to this infection.  As can be seen in the image above the patient has swelling and redness to the distal phalanx on the dorsal aspect with some redness ascending the lateral side of his finger into the proximal phalanx.  There are no red streaks going up the arm or into the hand.  This is consistent with a paronychia.  I anesthetized the finger with 0.3 mL of 1% lidocaine without epi after cleaning the finger with alcohol.  Once good anesthesia was achieved I cleansed the area with saline and chlorhexidine before making a stab incision with a #11 blade.  Approximately 3 miles of serosanguineous pus drained from the wound.  Patient tolerated the procedure well.  The area was again cleansed with chlorhexidine and saline before being dressed with gauze and Coban.  I will switch the patient to Augmentin from doxycycline since his may be oral bacteria causing the infection.  I have instructed him to stop taking the doxycycline and do not take both of them together.  He should continue the warm water and Epsom salt soaks.  ER and return precautions reviewed.  Patient denies need for work note.  Final Clinical Impressions(s) / UC Diagnoses   Final diagnoses:  Paronychia of finger of right hand     Discharge Instructions      Take the Augmentin twice daily with food for 7 days.  Stop taking the Doxycyline.  Soak your finger in warm water and Epsom salts 2-3 times a day to help facilitate drainage.  Keep a dressing on your finger until the drainage has stopped.  You can use over-the-counter  Tylenol and ibuprofen according to the package instructions as needed for pain.  Return for reevaluation if you develop any increased redness, swelling, drainage, or red streaks going up your finger, or fever.      ED Prescriptions     Medication Sig Dispense Auth. Provider   amoxicillin-clavulanate (AUGMENTIN) 875-125 MG tablet Take 1 tablet by mouth every 12 (twelve) hours for 7 days. 14 tablet Margarette Canada, NP      PDMP not reviewed this encounter.   Margarette Canada, NP 08/15/22 1156

## 2022-08-19 ENCOUNTER — Other Ambulatory Visit (INDEPENDENT_AMBULATORY_CARE_PROVIDER_SITE_OTHER): Payer: Self-pay | Admitting: Vascular Surgery

## 2022-08-19 DIAGNOSIS — Z9889 Other specified postprocedural states: Secondary | ICD-10-CM

## 2022-08-22 IMAGING — MR MR FOOT*R* W/O CM
7 series · 40 of 40 positions shown · non-contrast
Comparison: None.

CLINICAL DATA: Right foot pain along the ball of the foot for 1
year with swelling. Prior removal of a Morton neuroma

EXAM:
MRI OF THE RIGHT FOREFOOT WITHOUT CONTRAST
TECHNIQUE: Multiplanar, multisequence MR imaging of the he right forefoot was
performed. No intravenous contrast was administered.

[Series 3: T1 · coronal · right · 3.0mm · 0.38mm/px · 7 of 47 slices shown (1 of 2)]
[im 1/47]
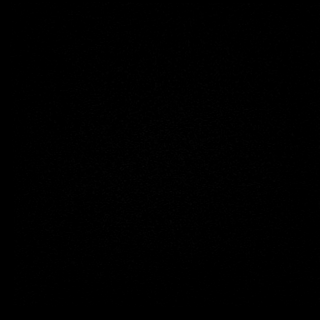
[im 8/47]
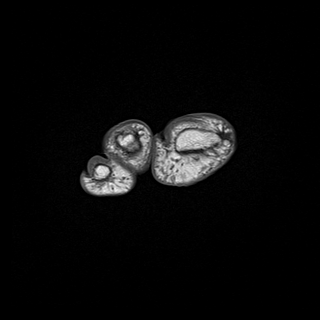
[im 16/47]
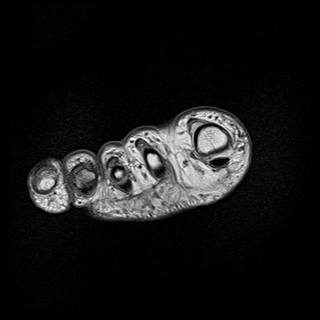
[im 24/47]
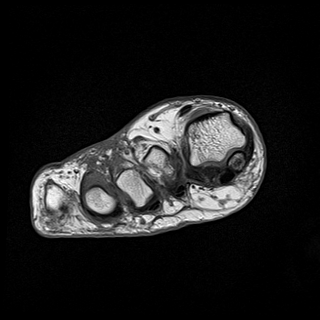
[im 31/47]
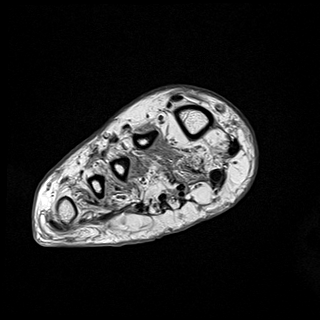
[im 39/47]
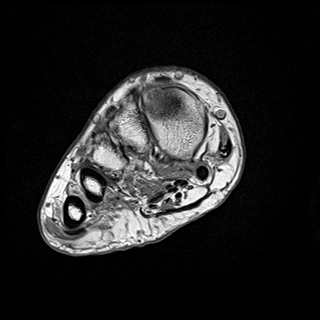
[im 47/47]
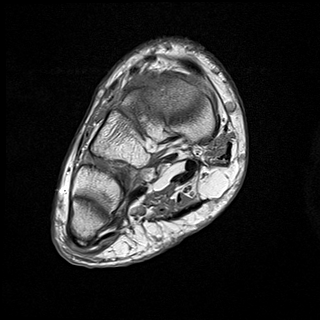

[Series 4: T2 · coronal · right · 3.0mm · 0.50mm/px · 8 of 46 slices shown (1 of 4)]
[im 1/46]
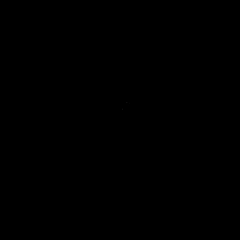
[im 7/46]
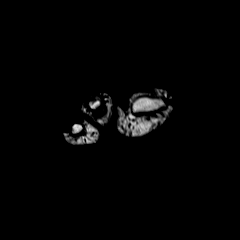
[im 13/46]
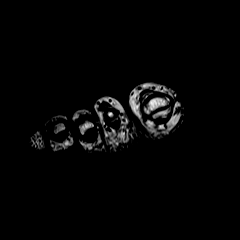
[im 20/46]
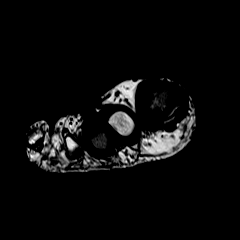
[im 26/46]
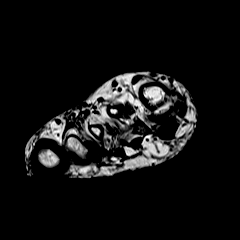
[im 33/46]
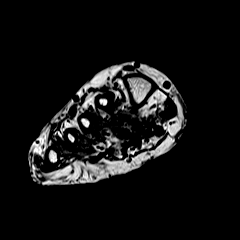
[im 39/46]
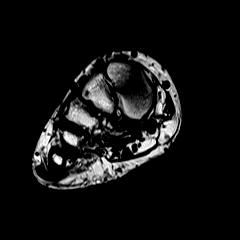
[im 46/46]
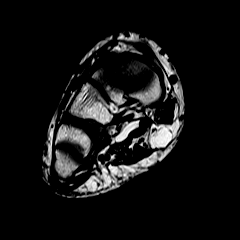

[Series 5: T2 · coronal · right · 3.0mm · 0.50mm/px · 8 of 46 slices shown (2 of 4)]
[im 1/46]
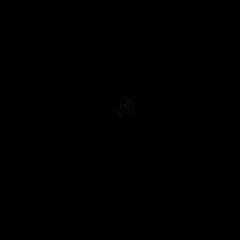
[im 7/46]
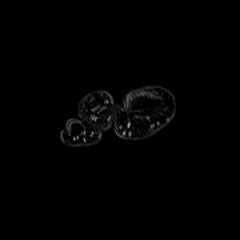
[im 13/46]
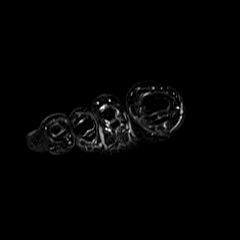
[im 20/46]
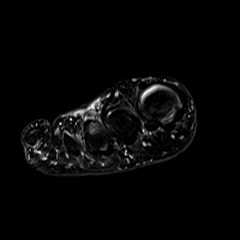
[im 26/46]
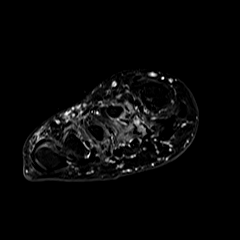
[im 33/46]
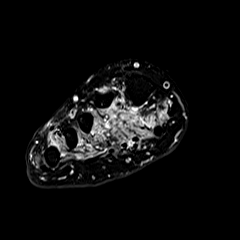
[im 39/46]
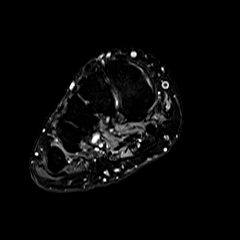
[im 46/46]
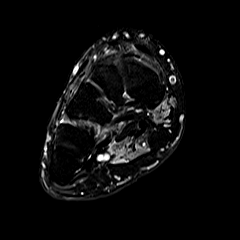

[Series 6: T1 · axial · right · 3.0mm · 0.75mm/px · z∈[-104,-26]mm · 4 of 22 slices shown (2 of 2)]
[im 1/22]
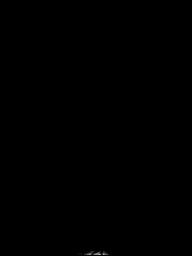
[im 8/22]
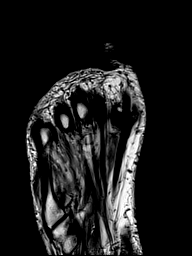
[im 15/22]
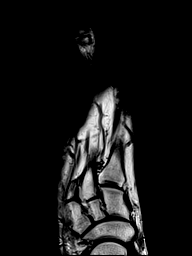
[im 22/22]
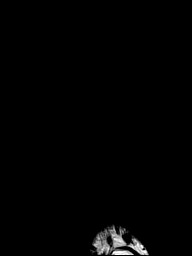

[Series 7: T2 · axial · right · 3.0mm · 0.70mm/px · z∈[-101,-23]mm · 4 of 22 slices shown (3 of 4)]
[im 1/22]
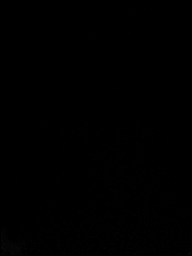
[im 8/22]
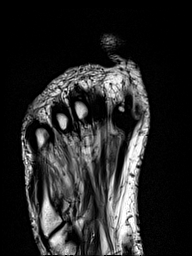
[im 15/22]
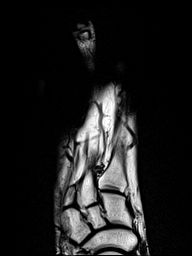
[im 22/22]
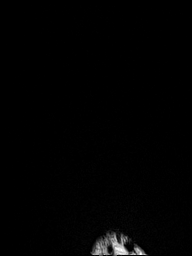

[Series 8: T2 · axial · right · 3.0mm · 0.70mm/px · z∈[-101,-23]mm · 4 of 22 slices shown (4 of 4)]
[im 1/22]
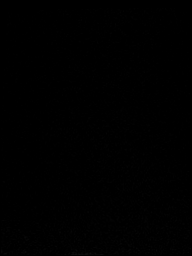
[im 8/22]
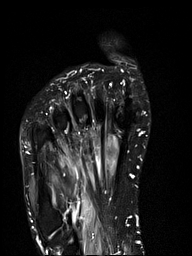
[im 15/22]
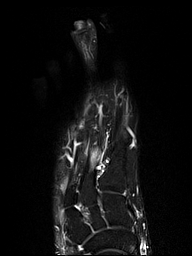
[im 22/22]
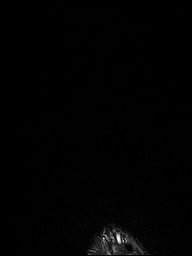

[Series 9: STIR · sagittal · right · 3.0mm · 0.62mm/px · 5 of 27 slices shown]
[im 1/27]
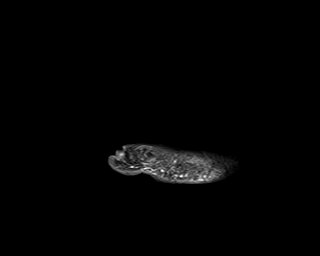
[im 7/27]
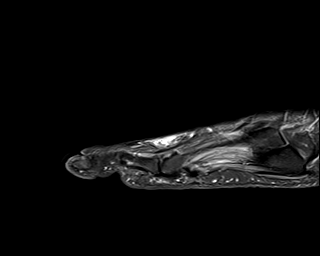
[im 14/27]
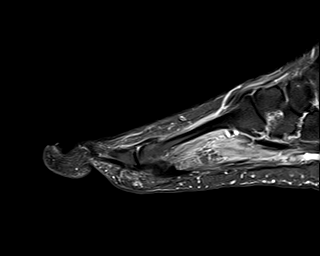
[im 20/27]
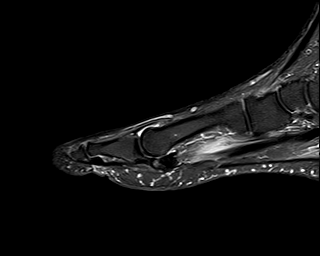
[im 27/27]
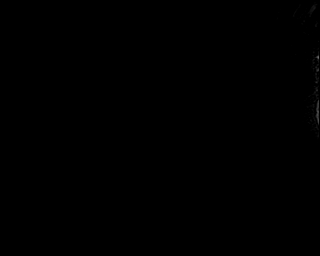

[40 of 40 positions shown; findings below may reference images not displayed]

FINDINGS: Bones/Joint/Cartilage

Mildly scalloped upper margin of the second metatarsal appears
chronic and is likely incidental. Mild spurring in the first MTP
joint. Bifid lateral first digit sesamoid with low signal intensity
suggesting sclerosis in the proximal ossicle, and low-level edema in
the distal ossicle.

Ligaments

The Lisfranc ligament appears intact.

Muscles and Tendons

Equivocal edema signal in regional musculature, possibly neurogenic
given the diffuse appearance.

Soft tissues

Localized infiltrative edema dorsal to the second, third, and fourth
MTP joints as shown on image 23 series 3 and image 23 series 5.
Possibilities include edema or postoperative scarring. Minimal
fullness of soft tissue density between the heads of the second and
third metatarsals, postoperative findings versus conceivably early
recurrence of Morton's neuroma
IMPRESSION: 1. Infiltrative edema signal dorsal to the second, third, and fourth
MTP joints in the subcutaneous tissues. Some of this could be from
postoperative scarring but local inflammation is not excluded.
2. Mild fullness of soft tissue density between the heads of the
second and third metatarsals, postoperative versus very early
recurrence of Morton's neuroma.
3. Potential mild lateral sesamoiditis, bifid lateral sesamoid with
sclerotic proximal ossicle and slightly edematous distal ossicle.
4. Mild degenerative findings at the first MTP joint.
5. Low-level edema in the regional musculature diffusely, probably
neurogenic.
6. Mildly scalloped appearance of the dorsum of the second
metatarsal, without underlying marrow edema, probably chronic and
incidental.

## 2022-08-23 NOTE — Progress Notes (Deleted)
MRN : [id number]  Justin Adkins is a 57 y.o. (07-12-1966) male who presents with chief complaint of check circulation.  History of Present Illness:   The patient returns to the office for followup and review status post angiogram with intervention on 07/02/2021.   Procedure:  Percutaneous atherectomy using the Rota Rex device with transluminal angioplasty right popliteal artery to 4 mm with a Lutonix balloon 2.    Percutaneous transluminal angioplasty right anterior   tibial artery to 2.5 mm with an ultra score balloon    The patient notes improvement in the lower extremity symptoms. No interval shortening of the patient's claudication distance or rest pain symptoms. Previous wounds have now healed.  No new ulcers or wounds have occurred since the last visit.   There have been no significant changes to the patient's overall health care.   The patient denies amaurosis fugax or recent TIA symptoms. There are no recent neurological changes noted. The patient denies history of DVT, PE or superficial thrombophlebitis. The patient denies recent episodes of angina or shortness of breath.    ABI's Rt=0.93 and Lt=0.99  (previous ABI's Rt=1.13 and Lt=1.21)    No outpatient medications have been marked as taking for the 08/25/22 encounter (Appointment) with Gilda Crease, Latina Craver, MD.    Past Medical History:  Diagnosis Date   Diabetes mellitus without complication (HCC)    GERD (gastroesophageal reflux disease)    History of echocardiogram    a. 07/2020 Echo: EF 60-65%, no rwma, mild LVH. Nl RV fxn. Mildly dil LA.   History of tobacco abuse    Hyperlipidemia    Peripheral vascular disease (HCC)    a. 05/2019 s/p PTA R popliteal/AT, mech thrombectomy to R popliteal/tibioperoneal trunk--> plavix & eliquis 2.5 bid since; 09/2019 LE u/s: near nl study.    Past Surgical History:  Procedure Laterality Date   COLONOSCOPY     COLONOSCOPY N/A 09/24/2020   Procedure: COLONOSCOPY;   Surgeon: Regis Bill, MD;  Location: [facility] ENDOSCOPY;  Service: Endoscopy;  Laterality: N/A;   ESOPHAGOGASTRODUODENOSCOPY (EGD) WITH PROPOFOL N/A 02/28/2020   Procedure: ESOPHAGOGASTRODUODENOSCOPY (EGD) WITH PROPOFOL;  Surgeon: Regis Bill, MD;  Location: [facility] ENDOSCOPY;  Service: Endoscopy;  Laterality: N/A;   FOOT SURGERY Right    HERNIA REPAIR     LOWER EXTREMITY ANGIOGRAPHY Right 05/25/2019   Procedure: LOWER EXTREMITY ANGIOGRAPHY;  Surgeon: Renford Dills, MD;  Location: [facility] INVASIVE CV LAB;  Service: Cardiovascular;  Laterality: Right;   LOWER EXTREMITY ANGIOGRAPHY Right 07/02/2021   Procedure: LOWER EXTREMITY ANGIOGRAPHY;  Surgeon: Renford Dills, MD;  Location: [facility] INVASIVE CV LAB;  Service: Cardiovascular;  Laterality: Right;   TONSILLECTOMY     VASCULAR SURGERY      Social History Social History   Tobacco Use   Smoking status: Former    Packs/day: 1.00    Years: 35.00    Total pack years: 35.00    Types: Cigarettes   Smokeless tobacco: Never  Vaping Use   Vaping Use: Never used  Substance Use Topics   Alcohol use: Yes    Comment: rarely   Drug use: No    Family History Family History  Problem Relation Age of Onset   Cancer Father    Heart attack Brother        MI @ 48    Allergies  Allergen Reactions   Naproxen Anaphylaxis   Liraglutide Nausea  Only    (Victoza)     REVIEW OF SYSTEMS (Negative unless checked)  Constitutional: [] Weight loss  [] Fever  [] Chills Cardiac: [] Chest pain   [] Chest pressure   [] Palpitations   [] Shortness of breath when laying flat   [] Shortness of breath with exertion. Vascular:  [x] Pain in legs with walking   [] Pain in legs at rest  [] History of DVT   [] Phlebitis   [] Swelling in legs   [] Varicose veins   [] Non-healing ulcers Pulmonary:   [] Uses home oxygen   [] Productive cough   [] Hemoptysis   [] Wheeze  [] COPD   [] Asthma Neurologic:  [] Dizziness   [] Seizures   [] History of stroke   [] History of TIA   [] Aphasia   [] Vissual changes   [] Weakness or numbness in arm   [] Weakness or numbness in leg Musculoskeletal:   [] Joint swelling   [] Joint pain   [] Low back pain Hematologic:  [] Easy bruising  [] Easy bleeding   [] Hypercoagulable state   [] Anemic Gastrointestinal:  [] Diarrhea   [] Vomiting  [] Gastroesophageal reflux/heartburn   [] Difficulty swallowing. Genitourinary:  [] Chronic kidney disease   [] Difficult urination  [] Frequent urination   [] Blood in urine Skin:  [] Rashes   [] Ulcers  Psychological:  [] History of anxiety   []  History of major depression.  Physical Examination  There were no vitals filed for this visit. There is no height or weight on file to calculate BMI. Gen: WD/WN, NAD Head: [place]/AT, No temporalis wasting.  Ear/Nose/Throat: Hearing grossly intact, nares w/o erythema or drainage Eyes: PER, EOMI, sclera nonicteric.  Neck: Supple, no masses.  No bruit or JVD.  Pulmonary:  Good air movement, no audible wheezing, no use of accessory muscles.  Cardiac: RRR, normal S1, S2, no Murmurs. Vascular:  mild trophic changes, no open wounds Vessel Right Left  Radial Palpable Palpable  PT Not Palpable Not Palpable  DP Not Palpable Not Palpable  Gastrointestinal: soft, non-distended. No guarding/no peritoneal signs.  Musculoskeletal: M/S 5/5 throughout.  No visible deformity.  Neurologic: CN 2-12 intact. Pain and light touch intact in extremities.  Symmetrical.  Speech is fluent. Motor exam as listed above. Psychiatric: Judgment intact, Mood & affect appropriate for pt's clinical situation. Dermatologic: No rashes or ulcers noted.  No changes consistent with cellulitis.   CBC Lab Results  Component Value Date   WBC 8.8 07/16/2022   HGB 15.9 07/16/2022   HCT 48.3 07/16/2022   MCV 90.4 07/16/2022   PLT 295 07/16/2022    BMET    Component Value Date/Time   NA 143 07/16/2022 1147   NA 141 09/10/2020 1124   K 3.8 07/16/2022 1147   CL 109 07/16/2022 1147   CO2 25 07/16/2022  1147   GLUCOSE 203 (H) 07/16/2022 1147   BUN 13 07/16/2022 1147   BUN 13 09/10/2020 1124   CREATININE 0.80 07/16/2022 1147   CALCIUM 8.4 (L) 07/16/2022 1147   GFRNONAA >60 07/16/2022 1147   GFRAA 102 09/10/2020 1124   CrCl cannot be calculated (Patient's most recent lab result is older than the maximum 21 days allowed.).  COAG Lab Results  Component Value Date   INR 1.0 07/16/2022    Radiology No results found.   Assessment/Plan There are no diagnoses linked to this encounter.   Hortencia Pilar, MD  08/23/2022 2:58 PM

## 2022-08-25 ENCOUNTER — Encounter (INDEPENDENT_AMBULATORY_CARE_PROVIDER_SITE_OTHER): Payer: BC Managed Care – PPO

## 2022-08-25 ENCOUNTER — Ambulatory Visit (INDEPENDENT_AMBULATORY_CARE_PROVIDER_SITE_OTHER): Payer: BC Managed Care – PPO | Admitting: Vascular Surgery

## 2022-08-25 DIAGNOSIS — E782 Mixed hyperlipidemia: Secondary | ICD-10-CM

## 2022-08-25 DIAGNOSIS — I10 Essential (primary) hypertension: Secondary | ICD-10-CM

## 2022-08-25 DIAGNOSIS — I70223 Atherosclerosis of native arteries of extremities with rest pain, bilateral legs: Secondary | ICD-10-CM

## 2022-08-25 DIAGNOSIS — E1142 Type 2 diabetes mellitus with diabetic polyneuropathy: Secondary | ICD-10-CM

## 2022-09-03 ENCOUNTER — Encounter (INDEPENDENT_AMBULATORY_CARE_PROVIDER_SITE_OTHER): Payer: Self-pay | Admitting: Nurse Practitioner

## 2022-09-03 ENCOUNTER — Ambulatory Visit (INDEPENDENT_AMBULATORY_CARE_PROVIDER_SITE_OTHER): Payer: BC Managed Care – PPO | Admitting: Nurse Practitioner

## 2022-09-03 ENCOUNTER — Ambulatory Visit (INDEPENDENT_AMBULATORY_CARE_PROVIDER_SITE_OTHER): Payer: BC Managed Care – PPO

## 2022-09-03 VITALS — BP 102/67 | HR 65 | Ht 68.0 in | Wt 164.0 lb

## 2022-09-03 DIAGNOSIS — Z9889 Other specified postprocedural states: Secondary | ICD-10-CM | POA: Diagnosis not present

## 2022-09-03 DIAGNOSIS — H93A9 Pulsatile tinnitus, unspecified ear: Secondary | ICD-10-CM | POA: Diagnosis not present

## 2022-09-03 DIAGNOSIS — I739 Peripheral vascular disease, unspecified: Secondary | ICD-10-CM

## 2022-09-03 DIAGNOSIS — I10 Essential (primary) hypertension: Secondary | ICD-10-CM | POA: Diagnosis not present

## 2022-09-03 DIAGNOSIS — E1142 Type 2 diabetes mellitus with diabetic polyneuropathy: Secondary | ICD-10-CM

## 2022-09-03 MED ORDER — CLOPIDOGREL BISULFATE 75 MG PO TABS: 75.0000 mg | ORAL_TABLET | Freq: Every day | ORAL | 3 refills | Status: DC

## 2022-09-03 MED ORDER — XARELTO 2.5 MG PO TABS: 2.5000 mg | ORAL_TABLET | Freq: Two times a day (BID) | ORAL | 6 refills | Status: AC

## 2022-09-03 NOTE — Progress Notes (Signed)
Subjective:    Patient ID: Justin Adkins, male    DOB: 1966/04/13, 57 y.o.   MRN: [id number] Chief Complaint  Patient presents with   Follow-up    6 month follow up with ABI    The patient returns to the office for followup and review of the noninvasive studies.   There have been no interval changes in lower extremity symptoms. No interval shortening of the patient's claudication distance or development of rest pain symptoms. No new ulcers or wounds have occurred since the last visit.  The patient does endorse having some occasional pulsatile tinnitus.  He notes that has been ongoing for years and it is sporadic in nature.   The patient denies amaurosis fugax or recent TIA symptoms. There are no documented recent neurological changes noted. There is no history of DVT, PE or superficial thrombophlebitis. The patient denies recent episodes of angina or shortness of breath.   ABI Rt=1.20 and Lt=1.24  (previous ABI's Rt=0.93 and Lt=0.99) Duplex ultrasound of the right lower extremity shows primarily biphasic/triphasic waveforms throughout.  There is a 50 to 74% stenosis noted in the popliteal artery    Review of Systems  All other systems reviewed and are negative.      Objective:   Physical Exam Vitals reviewed.  HENT:     Head: Normocephalic.  Neck:     Vascular: No carotid bruit.  Cardiovascular:     Rate and Rhythm: Normal rate and regular rhythm.     Pulses:          Radial pulses are 2+ on the right side and 2+ on the left side.       Dorsalis pedis pulses are detected w/ Doppler on the right side and detected w/ Doppler on the left side.       Posterior tibial pulses are detected w/ Doppler on the right side and detected w/ Doppler on the left side.  Pulmonary:     Effort: Pulmonary effort is normal.  Skin:    General: Skin is warm and dry.  Neurological:     Mental Status: He is alert and oriented to person, place, and time.  Psychiatric:        Mood and  Affect: Mood normal.        Behavior: Behavior normal.        Thought Content: Thought content normal.        Judgment: Judgment normal.     BP 102/67   Pulse 65   Ht 5\' 8"  (1.727 m)   Wt 164 lb (74.4 kg)   BMI 24.94 kg/m   Past Medical History:  Diagnosis Date   Diabetes mellitus without complication (HCC)    GERD (gastroesophageal reflux disease)    History of echocardiogram    a. 07/2020 Echo: EF 60-65%, no rwma, mild LVH. Nl RV fxn. Mildly dil LA.   History of tobacco abuse    Hyperlipidemia    Peripheral vascular disease (HCC)    a. 05/2019 s/p PTA R popliteal/AT, mech thrombectomy to R popliteal/tibioperoneal trunk--> plavix & eliquis 2.5 bid since; 09/2019 LE u/s: near nl study.    Social History   Socioeconomic History   Marital status: Single    Spouse name: Not on file   Number of children: Not on file   Years of education: Not on file   Highest education level: Not on file  Occupational History   Not on file  Tobacco Use   Smoking status: Former  Packs/day: 1.00    Years: 35.00    Total pack years: 35.00    Types: Cigarettes   Smokeless tobacco: Never  Vaping Use   Vaping Use: Never used  Substance and Sexual Activity   Alcohol use: Yes    Comment: rarely   Drug use: No   Sexual activity: Yes    Birth control/protection: None  Other Topics Concern   Not on file  Social History Narrative   Lives in [place] w/ wife and son.  Does not routinely exercise.   Social Determinants of Health   Financial Resource Strain: Not on file  Food Insecurity: Not on file  Transportation Needs: Not on file  Physical Activity: Not on file  Stress: Not on file  Social Connections: Not on file  Intimate Partner Violence: Not on file    Past Surgical History:  Procedure Laterality Date   COLONOSCOPY     COLONOSCOPY N/A 09/24/2020   Procedure: COLONOSCOPY;  Surgeon: Lesly Rubenstein, MD;  Location: [facility] ENDOSCOPY;  Service: Endoscopy;  Laterality: N/A;    ESOPHAGOGASTRODUODENOSCOPY (EGD) WITH PROPOFOL N/A 02/28/2020   Procedure: ESOPHAGOGASTRODUODENOSCOPY (EGD) WITH PROPOFOL;  Surgeon: Lesly Rubenstein, MD;  Location: [facility] ENDOSCOPY;  Service: Endoscopy;  Laterality: N/A;   FOOT SURGERY Right    HERNIA REPAIR     LOWER EXTREMITY ANGIOGRAPHY Right 05/25/2019   Procedure: LOWER EXTREMITY ANGIOGRAPHY;  Surgeon: Katha Cabal, MD;  Location: [place] CV LAB;  Service: Cardiovascular;  Laterality: Right;   LOWER EXTREMITY ANGIOGRAPHY Right 07/02/2021   Procedure: LOWER EXTREMITY ANGIOGRAPHY;  Surgeon: Katha Cabal, MD;  Location: [place] CV LAB;  Service: Cardiovascular;  Laterality: Right;   TONSILLECTOMY     VASCULAR SURGERY      Family History  Problem Relation Age of Onset   Cancer Father    Heart attack Brother        MI @ 64    Allergies  Allergen Reactions   Naproxen Anaphylaxis   Liraglutide Nausea Only    (Victoza)       Latest Ref Rng & Units 07/16/2022   11:47 AM 06/30/2022    4:10 PM 07/27/2020    6:34 AM  CBC  WBC 4.0 - 10.5 K/uL 8.8  11.0  4.8   Hemoglobin 13.0 - 17.0 g/dL 15.9  15.8  13.7   Hematocrit 39.0 - 52.0 % 48.3  45.7  39.5   Platelets 150 - 400 K/uL 295  257  186       CMP     Component Value Date/Time   NA 143 07/16/2022 1147   NA 141 09/10/2020 1124   K 3.8 07/16/2022 1147   CL 109 07/16/2022 1147   CO2 25 07/16/2022 1147   GLUCOSE 203 (H) 07/16/2022 1147   BUN 13 07/16/2022 1147   BUN 13 09/10/2020 1124   CREATININE 0.80 07/16/2022 1147   CALCIUM 8.4 (L) 07/16/2022 1147   PROT 7.4 07/16/2022 1147   ALBUMIN 3.9 07/16/2022 1147   AST 12 (L) 07/16/2022 1147   ALT 12 07/16/2022 1147   ALKPHOS 98 07/16/2022 1147   BILITOT 1.5 (H) 07/16/2022 1147   GFRNONAA >60 07/16/2022 1147   GFRAA 102 09/10/2020 1124     No results found.     Assessment & Plan:   1. Peripheral arterial disease with history of revascularization (HCC)  Recommend:  The patient has evidence  of atherosclerosis of the lower extremities with no claudication.  The patient does not voice  lifestyle limiting changes at this point in time.  Noninvasive studies do not suggest clinically significant change.  No invasive studies, angiography or surgery at this time The patient should continue walking and begin a more formal exercise program.  The patient should continue antiplatelet therapy and aggressive treatment of the lipid abnormalities  No changes in the patient's medications at this time  Continued surveillance is indicated as atherosclerosis is likely to progress with time.    The patient will continue follow up with noninvasive studies as ordered.   2. Essential hypertension Continue antihypertensive medications as already ordered, these medications have been reviewed and there are no changes at this time.  3. Type 2 diabetes mellitus with peripheral neuropathy (HCC) Continue hypoglycemic medications as already ordered, these medications have been reviewed and there are no changes at this time.  Hgb A1C to be monitored as already arranged by primary service  4. Pulsatile tinnitus The patient describes some sporadic pulsatile tinnitus.  Given his history of peripheral arterial disease we will have him undergo a carotid duplex at his next follow-up.  Otherwise the patient does not have any significant concerning symptoms for carotid disease such as amaurosis fugax or TIA-like symptoms.   Current Outpatient Medications on File Prior to Visit  Medication Sig Dispense Refill   atorvastatin (LIPITOR) 20 MG tablet Take 1 tablet (20 mg total) by mouth daily. 30 tablet 2   clindamycin (CLEOCIN T) 1 % lotion Apply topically 2 (two) times daily.     clotrimazole-betamethasone (LOTRISONE) cream Apply topically 2 (two) times daily.     docusate sodium (COLACE) 100 MG capsule Take 100 mg by mouth 2 (two) times daily as needed for mild constipation.     ergocalciferol (VITAMIN D2) 1.25  MG (50000 UT) capsule Take 50,000 Units by mouth every Saturday.     FARXIGA 10 MG TABS tablet Take 10 mg by mouth daily.     ketoconazole (NIZORAL) 2 % shampoo Apply topically.     mesalamine (LIALDA) 1.2 g EC tablet Take 4.8 g by mouth daily with breakfast.     NOVOLIN N 100 UNIT/ML injection Inject 30 Units into the skin 2 (two) times daily before a meal.     ondansetron (ZOFRAN-ODT) 4 MG disintegrating tablet Take by mouth.     OZEMPIC, 0.25 OR 0.5 MG/DOSE, 2 MG/1.5ML SOPN Inject 0.5 mg into the skin every Saturday.     pantoprazole (PROTONIX) 40 MG tablet Take 40 mg by mouth 2 (two) times daily.     No current facility-administered medications on file prior to visit.    There are no Patient Instructions on file for this visit. No follow-ups on file.   Kris Hartmann, NP

## 2022-09-09 LAB — VAS US ABI WITH/WO TBI
Left ABI: 1.24
Right ABI: 1.2

## 2022-10-15 ENCOUNTER — Telehealth (INDEPENDENT_AMBULATORY_CARE_PROVIDER_SITE_OTHER): Payer: Self-pay | Admitting: Vascular Surgery

## 2022-10-15 NOTE — Telephone Encounter (Signed)
Melissa from [facility] called and wanted to know if patient was cleared for surgery on November 07, 2022.  Please advise.

## 2022-10-15 NOTE — Telephone Encounter (Signed)
Clearance will be refax to [facility]

## 2022-10-30 ENCOUNTER — Other Ambulatory Visit (INDEPENDENT_AMBULATORY_CARE_PROVIDER_SITE_OTHER): Payer: Self-pay | Admitting: Nurse Practitioner

## 2022-11-06 ENCOUNTER — Encounter: Payer: Self-pay | Admitting: *Deleted

## 2022-11-07 ENCOUNTER — Ambulatory Visit
Admission: RE | Admit: 2022-11-07 | Discharge: 2022-11-07 | Disposition: A | Discharge status: Home / Self Care | Payer: BC Managed Care – PPO | Attending: Gastroenterology | Admitting: Gastroenterology

## 2022-11-07 ENCOUNTER — Encounter
Admission: RE | Disposition: A | Discharge status: Home / Self Care | Payer: Self-pay | Source: Home / Self Care | Attending: Gastroenterology

## 2022-11-07 ENCOUNTER — Ambulatory Visit: Payer: BC Managed Care – PPO | Admitting: Anesthesiology

## 2022-11-07 DIAGNOSIS — Z7902 Long term (current) use of antithrombotics/antiplatelets: Secondary | ICD-10-CM | POA: Diagnosis not present

## 2022-11-07 DIAGNOSIS — Z7901 Long term (current) use of anticoagulants: Secondary | ICD-10-CM | POA: Diagnosis not present

## 2022-11-07 DIAGNOSIS — I10 Essential (primary) hypertension: Secondary | ICD-10-CM | POA: Diagnosis not present

## 2022-11-07 DIAGNOSIS — Z794 Long term (current) use of insulin: Secondary | ICD-10-CM | POA: Diagnosis not present

## 2022-11-07 DIAGNOSIS — Z7985 Long-term (current) use of injectable non-insulin antidiabetic drugs: Secondary | ICD-10-CM | POA: Insufficient documentation

## 2022-11-07 DIAGNOSIS — K219 Gastro-esophageal reflux disease without esophagitis: Secondary | ICD-10-CM | POA: Insufficient documentation

## 2022-11-07 DIAGNOSIS — K640 First degree hemorrhoids: Secondary | ICD-10-CM | POA: Diagnosis not present

## 2022-11-07 DIAGNOSIS — J449 Chronic obstructive pulmonary disease, unspecified: Secondary | ICD-10-CM | POA: Insufficient documentation

## 2022-11-07 DIAGNOSIS — E1051 Type 1 diabetes mellitus with diabetic peripheral angiopathy without gangrene: Secondary | ICD-10-CM | POA: Diagnosis not present

## 2022-11-07 DIAGNOSIS — Z87891 Personal history of nicotine dependence: Secondary | ICD-10-CM | POA: Diagnosis not present

## 2022-11-07 DIAGNOSIS — K5120 Ulcerative (chronic) proctitis without complications: Secondary | ICD-10-CM | POA: Insufficient documentation

## 2022-11-07 DIAGNOSIS — Z7984 Long term (current) use of oral hypoglycemic drugs: Secondary | ICD-10-CM | POA: Insufficient documentation

## 2022-11-07 HISTORY — PX: COLONOSCOPY WITH PROPOFOL: SHX5780

## 2022-11-07 LAB — GLUCOSE, CAPILLARY: Glucose-Capillary: 192 mg/dL — ABNORMAL HIGH (ref 70–99)

## 2022-11-07 SURGERY — COLONOSCOPY WITH PROPOFOL
Anesthesia: General

## 2022-11-07 MED ORDER — PROPOFOL 10 MG/ML IV BOLUS
INTRAVENOUS | Status: DC | PRN
  Administered 2022-11-07 (×3): 20 mg via INTRAVENOUS
  Administered 2022-11-07: 80 mg via INTRAVENOUS

## 2022-11-07 MED ORDER — PROPOFOL 1000 MG/100ML IV EMUL
INTRAVENOUS | Status: AC
  Filled 2022-11-07: qty 100

## 2022-11-07 MED ORDER — PROPOFOL 500 MG/50ML IV EMUL
INTRAVENOUS | Status: DC | PRN
  Administered 2022-11-07: 75 ug/kg/min via INTRAVENOUS

## 2022-11-07 MED ORDER — SODIUM CHLORIDE 0.9 % IV SOLN
INTRAVENOUS | Status: DC
  Administered 2022-11-07: 20 mL/h via INTRAVENOUS

## 2022-11-07 MED ORDER — LIDOCAINE HCL (PF) 2 % IJ SOLN
INTRAMUSCULAR | Status: AC
  Filled 2022-11-07: qty 5

## 2022-11-07 MED ORDER — LIDOCAINE HCL (CARDIAC) PF 100 MG/5ML IV SOSY
PREFILLED_SYRINGE | INTRAVENOUS | Status: DC | PRN
  Administered 2022-11-07: 50 mg via INTRAVENOUS

## 2022-11-07 NOTE — H&P (Signed)
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 Last took plavix 5 days ago and xarelto on Monday. No significant abdominal surgeries. No family history of GI malignancies.    Current Facility-Administered Medications:    0.9 %  sodium chloride infusion, , Intravenous, Continuous, Dosia Yodice, Hilton Cork, MD, Last Rate: 20 mL/hr at 11/07/22 [id number], Restarted at 11/07/22 0735  Medications Prior to Admission  Medication Sig Dispense Refill Last Dose   atorvastatin (LIPITOR) 20 MG tablet Take 1 tablet (20 mg total) by mouth daily. 30 tablet 2 11/06/2022   clindamycin (CLEOCIN T) 1 % lotion Apply topically 2 (two) times daily.   11/06/2022   clopidogrel (PLAVIX) 75 MG tablet Take 1 tablet (75 mg total) by mouth daily. 90 tablet 3 Past Week   clotrimazole-betamethasone (LOTRISONE) cream Apply topically 2 (two) times daily.   11/06/2022   docusate sodium (COLACE) 100 MG capsule Take 100 mg by mouth 2 (two) times daily as needed for mild constipation.   11/06/2022   ergocalciferol (VITAMIN D2) 1.25 MG (50000 UT) capsule Take 50,000 Units by mouth every Saturday.   11/06/2022   FARXIGA 10 MG TABS tablet Take 10 mg by mouth daily.   11/06/2022   ketoconazole (NIZORAL) 2 % shampoo Apply topically.   11/06/2022   mesalamine (LIALDA) 1.2 g EC tablet Take 4.8 g by mouth daily with breakfast.   11/06/2022   NOVOLIN N 100 UNIT/ML injection Inject 30 Units into the skin 2 (two) times daily before a meal.   11/07/2022 at 0530   ondansetron (ZOFRAN-ODT) 4 MG disintegrating tablet Take by mouth.   11/06/2022   OZEMPIC, 0.25 OR 0.5 MG/DOSE, 2 MG/1.5ML SOPN Inject 0.5 mg into the skin every Saturday.   Past Week   pantoprazole (PROTONIX) 40 MG tablet Take 40 mg by mouth 2 (two) times daily.   11/06/2022    rivaroxaban (XARELTO) 2.5 MG TABS tablet Take 1 tablet (2.5 mg total) by mouth 2 (two) times daily. 60 tablet 6 Past Week     Allergies  Allergen Reactions   Naproxen Anaphylaxis   Liraglutide Nausea Only    (Victoza)     Past Medical History:  Diagnosis Date   Diabetes mellitus without complication (HCC)    GERD (gastroesophageal reflux disease)    History of echocardiogram    a. 07/2020 Echo: EF 60-65%, no rwma, mild LVH. Nl RV fxn. Mildly dil LA.   History of tobacco abuse    Hyperlipidemia    Peripheral vascular disease ([place])    a. 05/2019 s/p PTA R popliteal/AT, mech thrombectomy to R popliteal/tibioperoneal trunk--> plavix & eliquis 2.5 bid since; 09/2019 LE u/s: near nl study.    Review of systems:  Otherwise negative.    Physical Exam  Gen: Alert, oriented. Appears stated age.  HEENT: PERRLA. Lungs: No respiratory distress CV: RRR Abd: soft, benign, no masses Ext: No edema    Planned procedures: Proceed with colonoscopy. The patient understands the nature of the planned procedure, indications, risks, alternatives and potential complications including but not limited to bleeding, infection, perforation, damage to internal organs and possible oversedation/side effects from anesthesia. The patient agrees and gives consent to proceed.  Please refer to procedure notes for findings, recommendations and patient disposition/instructions.     Lesly Rubenstein, MD [facility]ology

## 2022-11-07 NOTE — Anesthesia Postprocedure Evaluation (Signed)
Anesthesia Post Note  Patient: Justin Adkins  Procedure(s) Performed: COLONOSCOPY WITH PROPOFOL  Patient location during evaluation: PACU Anesthesia Type: General Level of consciousness: awake and oriented Pain management: pain level controlled Vital Signs Assessment: post-procedure vital signs reviewed and stable Respiratory status: spontaneous breathing Cardiovascular status: stable Anesthetic complications: no   No notable events documented.   Last Vitals:  Vitals:   11/07/22 0820 11/07/22 0830  BP: 102/66 103/65  Pulse: 80 77  Resp: (!) 21 (!) 21  Temp: (!) 36.1 C   SpO2: 98% 100%    Last Pain:  Vitals:   11/07/22 0830  TempSrc:   PainSc: 0-No pain                 VAN STAVEREN,Dylan Monforte

## 2022-11-07 NOTE — Interval H&P Note (Signed)
History and Physical Interval Note:  11/07/2022 7:56 AM  Justin Adkins  has presented today for surgery, with the diagnosis of ulcerative colitis.  The various methods of treatment have been discussed with the patient and family. After consideration of risks, benefits and other options for treatment, the patient has consented to  Procedure(s): COLONOSCOPY WITH PROPOFOL (N/A) as a surgical intervention.  The patient's history has been reviewed, patient examined, no change in status, stable for surgery.  I have reviewed the patient's chart and labs.  Questions were answered to the patient's satisfaction.     Lesly Rubenstein  Ok to proceed with colonoscopy

## 2022-11-07 NOTE — Anesthesia Preprocedure Evaluation (Signed)
Anesthesia Evaluation  Patient identified by MRN, date of birth, ID band Patient awake    Reviewed: Allergy & Precautions, NPO status , Patient's Chart, lab work & pertinent test results  Airway Mallampati: II  TM Distance: >3 FB Neck ROM: full    Dental  (+) Teeth Intact   Pulmonary neg pulmonary ROS, COPD, Patient abstained from smoking., former smoker   Pulmonary exam normal  + decreased breath sounds      Cardiovascular Exercise Tolerance: Good hypertension, + Peripheral Vascular Disease  negative cardio ROS Normal cardiovascular exam Rhythm:Regular Rate:Normal     Neuro/Psych negative neurological ROS  negative psych ROS   GI/Hepatic negative GI ROS, Neg liver ROS,GERD  Medicated and Controlled,,  Endo/Other  negative endocrine ROSdiabetes, Poorly Controlled, Type 1, Insulin Dependent    Renal/GU negative Renal ROS  negative genitourinary   Musculoskeletal negative musculoskeletal ROS (+)    Abdominal   Peds negative pediatric ROS (+)  Hematology negative hematology ROS (+)   Anesthesia Other Findings Past Medical History: No date: Diabetes mellitus without complication (HCC) No date: GERD (gastroesophageal reflux disease) No date: History of echocardiogram     Comment:  a. 07/2020 Echo: EF 60-65%, no rwma, mild LVH. Nl RV               fxn. Mildly dil LA. No date: History of tobacco abuse No date: Hyperlipidemia No date: Peripheral vascular disease ([place])     Comment:  a. 05/2019 s/p PTA R popliteal/AT, mech thrombectomy to               R popliteal/tibioperoneal trunk--> plavix & eliquis 2.5               bid since; 09/2019 LE u/s: near nl study.  Past Surgical History: No date: COLONOSCOPY 09/24/2020: COLONOSCOPY; N/A     Comment:  Procedure: COLONOSCOPY;  Surgeon: Lesly Rubenstein,               MD;  Location: [facility] ENDOSCOPY;  Service: Endoscopy;                Laterality: N/A; 02/28/2020:  ESOPHAGOGASTRODUODENOSCOPY (EGD) WITH PROPOFOL; N/A     Comment:  Procedure: ESOPHAGOGASTRODUODENOSCOPY (EGD) WITH               PROPOFOL;  Surgeon: Lesly Rubenstein, MD;  Location:               [facility] ENDOSCOPY;  Service: Endoscopy;  Laterality: N/A; No date: FOOT SURGERY; Right No date: HERNIA REPAIR 05/25/2019: LOWER EXTREMITY ANGIOGRAPHY; Right     Comment:  Procedure: LOWER EXTREMITY ANGIOGRAPHY;  Surgeon:               Katha Cabal, MD;  Location: [place] CV LAB;               Service: Cardiovascular;  Laterality: Right; 07/02/2021: LOWER EXTREMITY ANGIOGRAPHY; Right     Comment:  Procedure: LOWER EXTREMITY ANGIOGRAPHY;  Surgeon:               Katha Cabal, MD;  Location: [place] CV LAB;               Service: Cardiovascular;  Laterality: Right; No date: TONSILLECTOMY No date: VASCULAR SURGERY  BMI    Body Mass Index: 23.56 kg/m      Reproductive/Obstetrics negative OB ROS  Anesthesia Physical Anesthesia Plan  ASA: 3  Anesthesia Plan: General   Post-op Pain Management:    Induction:   PONV Risk Score and Plan: Propofol infusion and TIVA  Airway Management Planned: Natural Airway  Additional Equipment:   Intra-op Plan:   Post-operative Plan:   Informed Consent: I have reviewed the patients History and Physical, chart, labs and discussed the procedure including the risks, benefits and alternatives for the proposed anesthesia with the patient or authorized representative who has indicated his/her understanding and acceptance.     Dental Advisory Given  Plan Discussed with: CRNA and Surgeon  Anesthesia Plan Comments:        Anesthesia Quick Evaluation

## 2022-11-07 NOTE — Transfer of Care (Signed)
Immediate Anesthesia Transfer of Care Note  Patient: Justin Adkins  Procedure(s) Performed: COLONOSCOPY WITH PROPOFOL  Patient Location: Endoscopy Unit  Anesthesia Type:General  Level of Consciousness: responds to stimulation  Airway & Oxygen Therapy: Patient Spontanous Breathing  Post-op Assessment: Report given to RN and Post -op Vital signs reviewed and stable  Post vital signs: Reviewed and stable  Last Vitals:  Vitals Value Taken Time  BP 102/66 0820   Temp    Pulse 78   Resp 17   SpO2 100%     Last Pain:  Vitals:   11/07/22 0656  TempSrc: Temporal  PainSc: 2          Complications: No notable events documented.

## 2022-11-07 NOTE — Op Note (Signed)
[facility] Patient Name: Justin Adkins Procedure Date: 11/07/2022 7:16 AM MRN: [id number] Account #: [id number] Date of Birth: [birth year] Admit Type: Outpatient Age: 57 Room: [facility] ENDO ROOM 3 Gender: Male Note Status: Finalized Instrument Name: Jasper Riling [id number] Procedure:             Colonoscopy Indications:           Chronic ulcerative proctitis Providers:             Andrey Farmer MD, MD Medicines:             Monitored Anesthesia Care Complications:         No immediate complications. Estimated blood loss:                         Minimal. Procedure:             Pre-Anesthesia Assessment:                        - Prior to the procedure, a History and Physical was                         performed, and patient medications and allergies were                         reviewed. The patient is competent. The risks and                         benefits of the procedure and the sedation options and                         risks were discussed with the patient. All questions                         were answered and informed consent was obtained.                         Patient identification and proposed procedure were                         verified by the physician, the nurse, the                         anesthesiologist, the anesthetist and the technician                         in the endoscopy suite. Mental Status Examination:                         alert and oriented. Airway Examination: normal                         oropharyngeal airway and neck mobility. Respiratory                         Examination: clear to auscultation. CV Examination:                         normal. Prophylactic Antibiotics: The patient does not  require prophylactic antibiotics. Prior                         Anticoagulants: The patient has taken Plavix                         (clopidogrel), last dose was 5 days prior to                          procedure. ASA Grade Assessment: III - A patient with                         severe systemic disease. After reviewing the risks and                         benefits, the patient was deemed in satisfactory                         condition to undergo the procedure. The anesthesia                         plan was to use monitored anesthesia care (MAC).                         Immediately prior to administration of medications,                         the patient was re-assessed for adequacy to receive                         sedatives. The heart rate, respiratory rate, oxygen                         saturations, blood pressure, adequacy of pulmonary                         ventilation, and response to care were monitored                         throughout the procedure. The physical status of the                         patient was re-assessed after the procedure.                        After obtaining informed consent, the colonoscope was                         passed under direct vision. Throughout the procedure,                         the patient's blood pressure, pulse, and oxygen                         saturations were monitored continuously. The                         Colonoscope was introduced through the anus and  advanced to the the cecum, identified by appendiceal                         orifice and ileocecal valve. The colonoscopy was                         performed without difficulty. The patient tolerated                         the procedure well. The quality of the bowel                         preparation was fair. The ileocecal valve, appendiceal                         orifice, and rectum were photographed. Findings:      The perianal and digital rectal examinations were normal.      Normal mucosa was found in the entire colon. Biopsies were taken with a       cold forceps for histology (just rectum). Estimated blood loss was        minimal.      Internal hemorrhoids were found during retroflexion. The hemorrhoids       were Grade I (internal hemorrhoids that do not prolapse).      The exam was otherwise without abnormality on direct and retroflexion       views. Impression:            - Preparation of the colon was fair.                        - Normal mucosa in the entire examined colon. Biopsied                         (just rectum).                        - Internal hemorrhoids.                        - The examination was otherwise normal on direct and                         retroflexion views. Recommendation:        - Discharge patient to home.                        - Resume previous diet.                        - Resume Plavix (clopidogrel) today and Xarelto                         (rivaroxaban) today at prior doses.                        - Await pathology results.                        - Repeat colonoscopy for surveillance based on  pathology results.                        - Return to referring physician as previously                         scheduled. Procedure Code(s):     --- Professional ---                        [phone], Colonoscopy, flexible; with biopsy, single or                         multiple Diagnosis Code(s):     --- Professional ---                        K64.0, First degree hemorrhoids                        K51.20, Ulcerative (chronic) proctitis without                         complications CPT copyright 2022 American [employer] Association. All rights reserved. The codes documented in this report are preliminary and upon coder review may  be revised to meet current compliance requirements. Andrey Farmer MD, MD 11/07/2022 8:22:46 AM Number of Addenda: 0 Note Initiated On: 11/07/2022 7:16 AM Scope Withdrawal Time: 0 hours 8 minutes 1 second  Total Procedure Duration: 0 hours 14 minutes 30 seconds  Estimated Blood Loss:  Estimated blood loss was minimal.       [facility]

## 2022-11-10 ENCOUNTER — Encounter: Payer: Self-pay | Admitting: Gastroenterology

## 2022-11-10 LAB — SURGICAL PATHOLOGY

## 2022-12-04 ENCOUNTER — Other Ambulatory Visit: Payer: Self-pay

## 2022-12-04 ENCOUNTER — Emergency Department
Admission: EM | Admit: 2022-12-04 | Discharge: 2022-12-04 | Disposition: A | Discharge status: Home / Self Care | Payer: BC Managed Care – PPO | Attending: Emergency Medicine | Admitting: Emergency Medicine

## 2022-12-04 ENCOUNTER — Emergency Department: Payer: BC Managed Care – PPO

## 2022-12-04 DIAGNOSIS — K2900 Acute gastritis without bleeding: Secondary | ICD-10-CM | POA: Insufficient documentation

## 2022-12-04 DIAGNOSIS — R1013 Epigastric pain: Secondary | ICD-10-CM | POA: Diagnosis present

## 2022-12-04 DIAGNOSIS — K8020 Calculus of gallbladder without cholecystitis without obstruction: Secondary | ICD-10-CM | POA: Diagnosis not present

## 2022-12-04 DIAGNOSIS — E119 Type 2 diabetes mellitus without complications: Secondary | ICD-10-CM | POA: Diagnosis not present

## 2022-12-04 LAB — COMPREHENSIVE METABOLIC PANEL
ALT: 23 U/L (ref 0–44)
AST: 19 U/L (ref 15–41)
Albumin: 4.4 g/dL (ref 3.5–5.0)
Alkaline Phosphatase: 90 U/L (ref 38–126)
Anion gap: 10 (ref 5–15)
BUN: 15 mg/dL (ref 6–20)
CO2: 23 mmol/L (ref 22–32)
Calcium: 9.2 mg/dL (ref 8.9–10.3)
Chloride: 103 mmol/L (ref 98–111)
Creatinine, Ser: 0.7 mg/dL (ref 0.61–1.24)
GFR, Estimated: >60 mL/min (ref >60)
Glucose, Bld: 311 mg/dL — ABNORMAL HIGH (ref 70–99)
Potassium: 4.3 mmol/L (ref 3.5–5.1)
Sodium: 136 mmol/L (ref 135–145)
Total Bilirubin: 1.4 mg/dL — ABNORMAL HIGH (ref 0.3–1.2)
Total Protein: 7.7 g/dL (ref 6.5–8.1)

## 2022-12-04 LAB — CBC
HCT: 49.7 % (ref 39.0–52.0)
Hemoglobin: 16.5 g/dL (ref 13.0–17.0)
MCH: 30.2 pg (ref 26.0–34.0)
MCHC: 33.2 g/dL (ref 30.0–36.0)
MCV: 90.9 fL (ref 80.0–100.0)
Platelets: 206 10*3/uL (ref 150–400)
RBC: 5.47 MIL/uL (ref 4.22–5.81)
RDW: 13.6 % (ref 11.5–15.5)
WBC: 10.4 10*3/uL (ref 4.0–10.5)
nRBC: 0 % (ref 0.0–0.2)

## 2022-12-04 LAB — TROPONIN I (HIGH SENSITIVITY): Troponin I (High Sensitivity): 5 ng/L (ref <18)

## 2022-12-04 LAB — LIPASE, BLOOD: Lipase: 29 U/L (ref 11–51)

## 2022-12-04 MED ORDER — LIDOCAINE VISCOUS HCL 2 % MT SOLN
15.0000 mL | Freq: Once | OROMUCOSAL | Status: AC
  Administered 2022-12-04: 15 mL via ORAL
  Filled 2022-12-04: qty 15

## 2022-12-04 MED ORDER — PANTOPRAZOLE SODIUM 40 MG PO TBEC: 40.0000 mg | DELAYED_RELEASE_TABLET | Freq: Every day | ORAL | 1 refills | Status: AC

## 2022-12-04 MED ORDER — TRAMADOL HCL 50 MG PO TABS: 50.0000 mg | ORAL_TABLET | Freq: Four times a day (QID) | ORAL | 0 refills | Status: AC | PRN

## 2022-12-04 MED ORDER — ALUM & MAG HYDROXIDE-SIMETH 200-200-20 MG/5ML PO SUSP
30.0000 mL | Freq: Once | ORAL | Status: AC
  Administered 2022-12-04: 30 mL via ORAL
  Filled 2022-12-04: qty 30

## 2022-12-04 NOTE — ED Provider Notes (Signed)
[facility] Provider Note    Event Date/Time   First MD Initiated Contact with Patient 12/04/22 [phone]     (approximate)   History   Abdominal Pain   HPI  Justin Adkins is a 57 y.o. male with history of diabetes, acid reflux who presents with epigastric discomfort which started today.  He reports that his improved somewhat.  Did have some nausea, now improved, some soft stools, now improved as well.  No history of abdominal surgery.  Does have a history of hepatitis in the past     Physical Exam   Triage Vital Signs: ED Triage Vitals  Enc Vitals Group     BP 12/04/22 0731 (!) 147/94     Pulse Rate 12/04/22 0731 87     Resp 12/04/22 0731 16     Temp 12/04/22 0737 97.6 F (36.4 C)     Temp Source 12/04/22 1046 Oral     SpO2 12/04/22 0731 95 %     Weight 12/04/22 0732 74.8 kg (165 lb)     Height 12/04/22 0732 1.727 m ( )     Head Circumference --      Peak Flow --      Pain Score 12/04/22 0732 8     Pain Loc --      Pain Edu? --      Excl. in GC? --     Most recent vital signs: Vitals:   12/04/22 0737 12/04/22 1046  BP:  (!) 140/84  Pulse:  79  Resp:  18  Temp: 97.6 F (36.4 C) 97.8 F (36.6 C)  SpO2:  99%     General: Awake, no distress.  CV:  Good peripheral perfusion.  Resp:  Normal effort.  Abd:  No distention.  Mild epigastric tenderness, otherwise reassuring exam Other:     ED Results / Procedures / Treatments   Labs (all labs ordered are listed, but only abnormal results are displayed) Labs Reviewed  COMPREHENSIVE METABOLIC PANEL - Abnormal; Notable for the following components:      Result Value   Glucose, Bld 311 (*)    Total Bilirubin 1.4 (*)    All other components within normal limits  CBC  LIPASE, BLOOD  TROPONIN I (HIGH SENSITIVITY)     EKG  ED ECG REPORT I, Jene Every, the attending physician, personally viewed and interpreted this ECG.  Date: 12/04/2022  Rhythm: normal sinus rhythm QRS  Axis: normal Intervals: normal ST/T Wave abnormalities: normal Narrative Interpretation: no evidence of acute ischemia    RADIOLOGY Ultrasound viewed interpret by me, cholelithiasis noted, no evidence of cholecystitis    PROCEDURES:  Critical Care performed:   Procedures   MEDICATIONS ORDERED IN ED: Medications  alum & mag hydroxide-simeth (MAALOX/MYLANTA) 200-200-20 MG/5ML suspension 30 mL (30 mLs Oral Given 12/04/22 0926)    And  lidocaine (XYLOCAINE) 2 % viscous mouth solution 15 mL (15 mLs Oral Given 12/04/22 0926)     IMPRESSION / MDM / ASSESSMENT AND PLAN / ED COURSE  I reviewed the triage vital signs and the nursing notes. Patient's presentation is most consistent with acute presentation with potential threat to life or bodily function.  Patient presents with epigastric pain as detailed above.  Differential includes gastritis, cholelithiasis, pancreatitis, hepatitis, does not appear cardiac in nature.  EKG is reassuring.  High sensitive troponin is normal  Lab work reviewed and is otherwise unremarkable, LFTs normal, lipase normal,  Ultrasound obtained which demonstrates cholelithiasis  Patient  feeling better after GI cocktail, not consistent with cholecystitis, will refer for outpatient follow-up with surgery as needed, discussed return precautions, no indication for admission, patient agrees with this plan      FINAL CLINICAL IMPRESSION(S) / ED DIAGNOSES   Final diagnoses:  Acute gastritis without hemorrhage, unspecified gastritis type  Calculus of gallbladder without cholecystitis without obstruction     Rx / DC Orders   ED Discharge Orders          Ordered    pantoprazole (PROTONIX) 40 MG tablet  Daily        12/04/22 1027    traMADol (ULTRAM) 50 MG tablet  Every 6 hours PRN        12/04/22 1027             Note:  This document was prepared using Dragon voice recognition software and may include unintentional dictation errors.   Jene Every, MD 12/04/22 1151

## 2022-12-04 NOTE — ED Notes (Signed)
AVS with prescriptions provided to and discussed with patient. Pt verbalizes understanding of discharge instructions and denies any questions or concerns at this time. Pt ambulated out of department independently with steady gait. ? ?

## 2022-12-04 NOTE — ED Triage Notes (Signed)
Pt to ED for epigastric pain that started at midnight. Denies n/v/shob.  States has hx of high liver enzymes when this happened before

## 2022-12-15 ENCOUNTER — Telehealth: Payer: Self-pay | Admitting: Surgery

## 2022-12-15 ENCOUNTER — Encounter: Payer: Self-pay | Admitting: Surgery

## 2022-12-15 ENCOUNTER — Ambulatory Visit: Payer: BC Managed Care – PPO | Admitting: Surgery

## 2022-12-15 VITALS — BP 109/66 | HR 74 | Temp 98.2°F | Ht 68.0 in | Wt 165.8 lb

## 2022-12-15 DIAGNOSIS — K8020 Calculus of gallbladder without cholecystitis without obstruction: Secondary | ICD-10-CM | POA: Diagnosis not present

## 2022-12-15 DIAGNOSIS — K4091 Unilateral inguinal hernia, without obstruction or gangrene, recurrent: Secondary | ICD-10-CM

## 2022-12-15 NOTE — Telephone Encounter (Signed)
Patient has been advised of Pre-Admission date/time, and Surgery date at [facility].  Surgery Date: 12/25/22 Preadmission Testing Date: 12/19/22 (phone 8a-1p)  Patient has been made aware to call [phone], between 1-3:00pm the day before surgery, to find out what time to arrive for surgery.

## 2022-12-15 NOTE — Patient Instructions (Signed)
You have requested to have your gallbladder removed. This will be done at [facility] with Dr. Pabon.  You will most likely be out of work 1-2 weeks for this surgery.  If you have FMLA or disability paperwork that needs filled out you may drop this off at our office or this can be faxed to [phone].  You will return after your post-op appointment with a lifting restriction for approximately 4 more weeks.  You will be able to eat anything you would like to following surgery. But, start by eating a bland diet and advance this as tolerated. The Gallbladder diet is below, please go as closely by this diet as possible prior to surgery to avoid any further attacks.  Please see the (blue)pre-care form that you have been given today. Our surgery scheduler will call you to verify surgery date and to go over information.   If you have any questions, please call our office.  Laparoscopic Cholecystectomy Laparoscopic cholecystectomy is surgery to remove the gallbladder. The gallbladder is located in the upper right part of the abdomen, behind the liver. It is a storage sac for bile, which is produced in the liver. Bile aids in the digestion and absorption of fats. Cholecystectomy is often done for inflammation of the gallbladder (cholecystitis). This condition is usually caused by a buildup of gallstones (cholelithiasis) in the gallbladder. Gallstones can block the flow of bile, and that can result in inflammation and pain. In severe cases, emergency surgery may be required. If emergency surgery is not required, you will have time to prepare for the procedure. Laparoscopic surgery is an alternative to open surgery. Laparoscopic surgery has a shorter recovery time. Your common bile duct may also need to be examined during the procedure. If stones are found in the common bile duct, they may be removed. LET YOUR HEALTH CARE PROVIDER KNOW ABOUT: Any allergies you have. All medicines you are taking,  including vitamins, herbs, eye drops, creams, and over-the-counter medicines. Previous problems you or members of your family have had with the use of anesthetics. Any blood disorders you have. Previous surgeries you have had.  Any medical conditions you have. RISKS AND COMPLICATIONS Generally, this is a safe procedure. However, problems may occur, including: Infection. Bleeding. Allergic reactions to medicines. Damage to other structures or organs. A stone remaining in the common bile duct. A bile leak from the cyst duct that is clipped when your gallbladder is removed. The need to convert to open surgery, which requires a larger incision in the abdomen. This may be necessary if your surgeon thinks that it is not safe to continue with a laparoscopic procedure. BEFORE THE PROCEDURE Ask your health care provider about: Changing or stopping your regular medicines. This is especially important if you are taking diabetes medicines or blood thinners. Taking medicines such as aspirin and ibuprofen. These medicines can thin your blood. Do not take these medicines before your procedure if your health care provider instructs you not to. Follow instructions from your health care provider about eating or drinking restrictions. Let your health care provider know if you develop a cold or an infection before surgery. Plan to have someone take you home after the procedure. Ask your health care provider how your surgical site will be marked or identified. You may be given antibiotic medicine to help prevent infection. PROCEDURE To reduce your risk of infection: Your health care team will wash or sanitize their hands. Your skin will be washed with soap. An IV   tube may be inserted into one of your veins. You will be given a medicine to make you fall asleep (general anesthetic). A breathing tube will be placed in your mouth. The surgeon will make several small cuts (incisions) in your abdomen. A thin,  lighted tube (laparoscope) that has a tiny camera on the end will be inserted through one of the small incisions. The camera on the laparoscope will send a picture to a TV screen (monitor) in the operating room. This will give the surgeon a good view inside your abdomen. A gas will be pumped into your abdomen. This will expand your abdomen to give the surgeon more room to perform the surgery. Other tools that are needed for the procedure will be inserted through the other incisions. The gallbladder will be removed through one of the incisions. After your gallbladder has been removed, the incisions will be closed with stitches (sutures), staples, or skin glue. Your incisions may be covered with a bandage (dressing). The procedure may vary among health care providers and hospitals. AFTER THE PROCEDURE Your blood pressure, heart rate, breathing rate, and blood oxygen level will be monitored often until the medicines you were given have worn off. You will be given medicines as needed to control your pain.   This information is not intended to replace advice given to you by your health care provider. Make sure you discuss any questions you have with your health care provider.   Document Released: 08/04/2005 Document Revised: 04/25/2015 Document Reviewed: 03/16/2013 Elsevier Interactive Patient Education 2016 [employer]   Low-Fat Diet for Gallbladder Conditions A low-fat diet can be helpful if you have pancreatitis or a gallbladder condition. With these conditions, your pancreas and gallbladder have trouble digesting fats. A healthy eating plan with less fat will help rest your pancreas and gallbladder and reduce your symptoms. WHAT DO I NEED TO KNOW ABOUT THIS DIET? Eat a low-fat diet. Reduce your fat intake to less than 20-30% of your total daily calories. This is less than 50-60 g of fat per day. Remember that you need some fat in your diet. Ask your dietician what your daily goal should  be. Choose nonfat and low-fat healthy foods. Look for the words "nonfat," "low fat," or "fat free." As a guide, look on the label and choose foods with less than 3 g of fat per serving. Eat only one serving. Avoid alcohol. Do not smoke. If you need help quitting, talk with your health care provider. Eat small frequent meals instead of three large heavy meals. WHAT FOODS CAN I EAT? Grains Include healthy grains and starches such as potatoes, wheat bread, fiber-rich cereal, and brown rice. Choose whole grain options whenever possible. In adults, whole grains should account for 45-65% of your daily calories.  Fruits and Vegetables Eat plenty of fruits and vegetables. Fresh fruits and vegetables add fiber to your diet. Meats and Other Protein Sources Eat lean meat such as chicken and pork. Trim any fat off of meat before cooking it. Eggs, fish, and beans are other sources of protein. In adults, these foods should account for 10-35% of your daily calories. Dairy Choose low-fat milk and dairy options. Dairy includes fat and protein, as well as calcium.  Fats and Oils Limit high-fat foods such as fried foods, sweets, baked goods, sugary drinks.  Other Creamy sauces and condiments, such as mayonnaise, can add extra fat. Think about whether or not you need to use them, or use smaller amounts or low fat options.   WHAT FOODS ARE NOT RECOMMENDED? High fat foods, such as: Baked goods. Ice cream. French toast. Sweet rolls. Pizza. Cheese bread. Foods covered with batter, butter, creamy sauces, or cheese. Fried foods. Sugary drinks and desserts. Foods that cause gas or bloating   This information is not intended to replace advice given to you by your health care provider. Make sure you discuss any questions you have with your health care provider.   Document Released: 08/09/2013 Document Reviewed: 08/09/2013 Elsevier Interactive Patient Education 2016 [employer] Inc.   

## 2022-12-17 ENCOUNTER — Encounter: Payer: Self-pay | Admitting: Surgery

## 2022-12-17 NOTE — Progress Notes (Signed)
Outpatient Surgical Follow Up  12/17/2022  Justin Adkins is an 57 y.o. male.   Chief Complaint  Patient presents with   New Patient (Initial Visit)    Gallbladder     HPI: 57 year old male seen in consultation at the request of Dr. Cyril Loosen.  He recently went to the emergency room due to epigastric and right upper quadrant pain.  Did have associated nausea but no vomiting.  Currently he feels better he continues to have this episode to a lesser degree.  Reports that the pain is dull seems to be worsening with certain meals.   He denies any fevers any chills no evidence of biliary obstruction.  No jaundice Does have evidence of diabetes at that time appropriate workup was performed to include ultrasound  that have personally reviewed showing evidence of cholelithiasis with abnormal wall gallstone.  Normal 3D.  Had a CT scan last year and also evidence of cholelithiasis and a right inguinal hernia.  LFTs were normal as well as his CBC.  He did have increase in the glucose He does have a prior history of open right inguinal hernia repair several years ago He takes Plavix and Xarelto. To perform more than 4 METS of activity without any shortness of breath or chest pain.   Past Medical History:  Diagnosis Date   Diabetes mellitus without complication (HCC)    GERD (gastroesophageal reflux disease)    History of echocardiogram    a. 07/2020 Echo: EF 60-65%, no rwma, mild LVH. Nl RV fxn. Mildly dil LA.   History of tobacco abuse    Hyperlipidemia    Peripheral vascular disease (HCC)    a. 05/2019 s/p PTA R popliteal/AT, mech thrombectomy to R popliteal/tibioperoneal trunk--> plavix & eliquis 2.5 bid since; 09/2019 LE u/s: near nl study.    Past Surgical History:  Procedure Laterality Date   COLONOSCOPY     COLONOSCOPY N/A 09/24/2020   Procedure: COLONOSCOPY;  Surgeon: Regis Bill, MD;  Location: [facility] ENDOSCOPY;  Service: Endoscopy;  Laterality: N/A;   COLONOSCOPY WITH PROPOFOL N/A  11/07/2022   Procedure: COLONOSCOPY WITH PROPOFOL;  Surgeon: Regis Bill, MD;  Location: [facility] ENDOSCOPY;  Service: Endoscopy;  Laterality: N/A;   ESOPHAGOGASTRODUODENOSCOPY (EGD) WITH PROPOFOL N/A 02/28/2020   Procedure: ESOPHAGOGASTRODUODENOSCOPY (EGD) WITH PROPOFOL;  Surgeon: Regis Bill, MD;  Location: [facility] ENDOSCOPY;  Service: Endoscopy;  Laterality: N/A;   FOOT SURGERY Right    HERNIA REPAIR     LOWER EXTREMITY ANGIOGRAPHY Right 05/25/2019   Procedure: LOWER EXTREMITY ANGIOGRAPHY;  Surgeon: Renford Dills, MD;  Location: [facility] INVASIVE CV LAB;  Service: Cardiovascular;  Laterality: Right;   LOWER EXTREMITY ANGIOGRAPHY Right 07/02/2021   Procedure: LOWER EXTREMITY ANGIOGRAPHY;  Surgeon: Renford Dills, MD;  Location: [facility] INVASIVE CV LAB;  Service: Cardiovascular;  Laterality: Right;   TONSILLECTOMY     VASCULAR SURGERY      Family History  Problem Relation Age of Onset   Cancer Father    Heart attack Brother        MI @ 77    Social History:  reports that he has quit smoking. His smoking use included cigarettes. He has a 35.00 pack-year smoking history. He has never used smokeless tobacco. He reports current alcohol use. He reports that he does not use drugs.  Allergies:  Allergies  Allergen Reactions   Naproxen Anaphylaxis   Liraglutide Nausea Only    (Victoza)    Medications reviewed.    ROS Full ROS  performed and is otherwise negative other than what is stated in HPI   BP 109/66   Pulse 74   Temp 98.2 F (36.8 C) (Oral)   Ht 5\' 8"  (1.727 m)   Wt 165 lb 12.8 oz (75.2 kg)   SpO2 96%   BMI 25.21 kg/m   Physical Exam Vitals and nursing note reviewed. Exam conducted with a chaperone present.  Constitutional:      General: He is not in acute distress.    Appearance: Normal appearance. He is normal weight. He is not ill-appearing.  Eyes:     General: No scleral icterus.       Right eye: No discharge.        Left eye: No discharge.   Cardiovascular:     Rate and Rhythm: Normal rate and regular rhythm.     Heart sounds: No murmur heard.    No friction rub.  Pulmonary:     Effort: Pulmonary effort is normal. No respiratory distress.     Breath sounds: Normal breath sounds. No stridor. No wheezing or rhonchi.  Abdominal:     General: Abdomen is flat. There is no distension.     Palpations: Abdomen is soft. There is no mass.     Tenderness: There is no abdominal tenderness. There is no guarding or rebound.     Hernia: A hernia is present.     Comments: Does have a recurrent right inguinal hernia that is reducible on the right side  Musculoskeletal:        General: Normal range of motion.     Cervical back: Normal range of motion and neck supple. No rigidity or tenderness.  Lymphadenopathy:     Cervical: No cervical adenopathy.  Skin:    General: Skin is warm and dry.     Capillary Refill: Capillary refill takes less than 2 seconds.  Neurological:     General: No focal deficit present.     Mental Status: He is alert and oriented to person, place, and time.  Psychiatric:        Mood and Affect: Mood normal.        Behavior: Behavior normal.        Thought Content: Thought content normal.        Judgment: Judgment normal.    Assessment/Plan: 57 year old male with symptomatic cholelithiasis with recurrent and progressive symptoms.  I Discussed with the patient in detail about his disease process.  I definitely recommend cholecystectomy and I do think he is a good candidate for robotic approach. The risks, benefits, complications, treatment options, and expected outcomes were discussed with the patient. The possibilities of bleeding, recurrent infection, finding a normal gallbladder, perforation of viscus organs, damage to surrounding structures, bile leak, abscess formation, needing a drain placed, the need for additional procedures, reaction to medication, pulmonary aspiration,  failure to diagnose a condition, the  possible need to convert to an open procedure, and creating a complication requiring transfusion or operation were discussed with the patient. The patient and/or family concurred with the proposed plan, giving informed consent.  Also talk about the recurrent right inguinal hernia.  I do think first order of business is to address his biliary disease.  He will likely benefit from repair of the recurrent inguinal hernia Also make sure we will hold anticoagulation and Plavix as well. I spent 55 minutes in this encounter including personally reviewing imaging studies, coordination of his care, placing orders performing appropriate documentation. Copy of this report was  sent to the referring provider    Sterling Big, MD [facility] General Surgeon

## 2022-12-17 NOTE — H&P (View-Only) (Signed)
Outpatient Surgical Follow Up  12/17/2022  Justin Adkins is an 57 y.o. male.   Chief Complaint  Patient presents with   New Patient (Initial Visit)    Gallbladder     HPI: 57-year-old male seen in consultation at the request of Dr. Kinner.  He recently went to the emergency room due to epigastric and right upper quadrant pain.  Did have associated nausea but no vomiting.  Currently he feels better he continues to have this episode to a lesser degree.  Reports that the pain is dull seems to be worsening with certain meals.   He denies any fevers any chills no evidence of biliary obstruction.  No jaundice Does have evidence of diabetes at that time appropriate workup was performed to include ultrasound  that have personally reviewed showing evidence of cholelithiasis with abnormal wall gallstone.  Normal 3D.  Had a CT scan last year and also evidence of cholelithiasis and a right inguinal hernia.  LFTs were normal as well as his CBC.  He did have increase in the glucose He does have a prior history of open right inguinal hernia repair several years ago He takes Plavix and Xarelto. To perform more than 4 METS of activity without any shortness of breath or chest pain.   Past Medical History:  Diagnosis Date   Diabetes mellitus without complication (HCC)    GERD (gastroesophageal reflux disease)    History of echocardiogram    a. 07/2020 Echo: EF 60-65%, no rwma, mild LVH. Nl RV fxn. Mildly dil LA.   History of tobacco abuse    Hyperlipidemia    Peripheral vascular disease (HCC)    a. 05/2019 s/p PTA R popliteal/AT, mech thrombectomy to R popliteal/tibioperoneal trunk--> plavix & eliquis 2.5 bid since; 09/2019 LE u/s: near nl study.    Past Surgical History:  Procedure Laterality Date   COLONOSCOPY     COLONOSCOPY N/A 09/24/2020   Procedure: COLONOSCOPY;  Surgeon: Locklear, Cameron T, MD;  Location: [facility] ENDOSCOPY;  Service: Endoscopy;  Laterality: N/A;   COLONOSCOPY WITH PROPOFOL N/A  11/07/2022   Procedure: COLONOSCOPY WITH PROPOFOL;  Surgeon: Locklear, Cameron T, MD;  Location: [facility] ENDOSCOPY;  Service: Endoscopy;  Laterality: N/A;   ESOPHAGOGASTRODUODENOSCOPY (EGD) WITH PROPOFOL N/A 02/28/2020   Procedure: ESOPHAGOGASTRODUODENOSCOPY (EGD) WITH PROPOFOL;  Surgeon: Locklear, Cameron T, MD;  Location: [facility] ENDOSCOPY;  Service: Endoscopy;  Laterality: N/A;   FOOT SURGERY Right    HERNIA REPAIR     LOWER EXTREMITY ANGIOGRAPHY Right 05/25/2019   Procedure: LOWER EXTREMITY ANGIOGRAPHY;  Surgeon: Schnier, Gregory G, MD;  Location: [facility] INVASIVE CV LAB;  Service: Cardiovascular;  Laterality: Right;   LOWER EXTREMITY ANGIOGRAPHY Right 07/02/2021   Procedure: LOWER EXTREMITY ANGIOGRAPHY;  Surgeon: Schnier, Gregory G, MD;  Location: [facility] INVASIVE CV LAB;  Service: Cardiovascular;  Laterality: Right;   TONSILLECTOMY     VASCULAR SURGERY      Family History  Problem Relation Age of Onset   Cancer Father    Heart attack Brother        MI @ 38    Social History:  reports that he has quit smoking. His smoking use included cigarettes. He has a 35.00 pack-year smoking history. He has never used smokeless tobacco. He reports current alcohol use. He reports that he does not use drugs.  Allergies:  Allergies  Allergen Reactions   Naproxen Anaphylaxis   Liraglutide Nausea Only    (Victoza)    Medications reviewed.    ROS Full ROS   performed and is otherwise negative other than what is stated in HPI   BP 109/66   Pulse 74   Temp 98.2 F (36.8 C) (Oral)   Ht 5' 8" (1.727 m)   Wt 165 lb 12.8 oz (75.2 kg)   SpO2 96%   BMI 25.21 kg/m   Physical Exam Vitals and nursing note reviewed. Exam conducted with a chaperone present.  Constitutional:      General: He is not in acute distress.    Appearance: Normal appearance. He is normal weight. He is not ill-appearing.  Eyes:     General: No scleral icterus.       Right eye: No discharge.        Left eye: No discharge.   Cardiovascular:     Rate and Rhythm: Normal rate and regular rhythm.     Heart sounds: No murmur heard.    No friction rub.  Pulmonary:     Effort: Pulmonary effort is normal. No respiratory distress.     Breath sounds: Normal breath sounds. No stridor. No wheezing or rhonchi.  Abdominal:     General: Abdomen is flat. There is no distension.     Palpations: Abdomen is soft. There is no mass.     Tenderness: There is no abdominal tenderness. There is no guarding or rebound.     Hernia: A hernia is present.     Comments: Does have a recurrent right inguinal hernia that is reducible on the right side  Musculoskeletal:        General: Normal range of motion.     Cervical back: Normal range of motion and neck supple. No rigidity or tenderness.  Lymphadenopathy:     Cervical: No cervical adenopathy.  Skin:    General: Skin is warm and dry.     Capillary Refill: Capillary refill takes less than 2 seconds.  Neurological:     General: No focal deficit present.     Mental Status: He is alert and oriented to person, place, and time.  Psychiatric:        Mood and Affect: Mood normal.        Behavior: Behavior normal.        Thought Content: Thought content normal.        Judgment: Judgment normal.    Assessment/Plan: 57-year-old male with symptomatic cholelithiasis with recurrent and progressive symptoms.  I Discussed with the patient in detail about his disease process.  I definitely recommend cholecystectomy and I do think he is a good candidate for robotic approach. The risks, benefits, complications, treatment options, and expected outcomes were discussed with the patient. The possibilities of bleeding, recurrent infection, finding a normal gallbladder, perforation of viscus organs, damage to surrounding structures, bile leak, abscess formation, needing a drain placed, the need for additional procedures, reaction to medication, pulmonary aspiration,  failure to diagnose a condition, the  possible need to convert to an open procedure, and creating a complication requiring transfusion or operation were discussed with the patient. The patient and/or family concurred with the proposed plan, giving informed consent.  Also talk about the recurrent right inguinal hernia.  I do think first order of business is to address his biliary disease.  He will likely benefit from repair of the recurrent inguinal hernia Also make sure we will hold anticoagulation and Plavix as well. I spent 55 minutes in this encounter including personally reviewing imaging studies, coordination of his care, placing orders performing appropriate documentation. Copy of this report was   sent to the referring provider    Jamaine Quintin, MD [facility] General Surgeon 

## 2022-12-19 ENCOUNTER — Encounter
Admission: RE | Admit: 2022-12-19 | Discharge: 2022-12-19 | Disposition: A | Discharge status: Home / Self Care | Payer: BC Managed Care – PPO | Source: Ambulatory Visit | Attending: Surgery | Admitting: Surgery

## 2022-12-19 DIAGNOSIS — Z01818 Encounter for other preprocedural examination: Secondary | ICD-10-CM

## 2022-12-19 HISTORY — DX: Male erectile dysfunction, unspecified: N52.9

## 2022-12-19 HISTORY — DX: Ulcerative colitis, unspecified, without complications: K51.90

## 2022-12-19 HISTORY — DX: Unspecified atherosclerosis of native arteries of extremities, right leg: I70.201

## 2022-12-19 HISTORY — DX: Atherosclerosis of native arteries of extremities with intermittent claudication, unspecified extremity: I70.219

## 2022-12-19 HISTORY — DX: Abnormal levels of other serum enzymes: R74.8

## 2022-12-19 HISTORY — DX: Type 2 diabetes mellitus without complications: E11.9

## 2022-12-19 HISTORY — DX: Essential (primary) hypertension: I10

## 2022-12-19 NOTE — Patient Instructions (Addendum)
Your procedure is scheduled on:12-25-22 Thursday Report to the Registration Desk on the 1st floor of the Medical Mall.Then proceed to the 2nd floor Surgery Desk To find out your arrival time, please call [phone] between 1PM - 3PM on:12-24-22 Wednesday If your arrival time is 6:00 am, do not arrive before that time as the Medical Mall entrance doors do not open until 6:00 am.  REMEMBER: Instructions that are not followed completely may result in serious medical risk, up to and including death; or upon the discretion of your surgeon and anesthesiologist your surgery may need to be rescheduled.  Do not eat food after midnight the night before surgery.  No gum chewing or hard candies.  You may however, drink Water up to 2 hours before you are scheduled to arrive for your surgery. Do not drink anything within 2 hours of your scheduled arrival time.  One week prior to surgery: Stop Anti-inflammatories (NSAIDS) such as Advil, Aleve, Ibuprofen, Motrin, Naproxen, Naprosyn and Aspirin based products such as Excedrin, Goody's Powder, BC Powder.You may however, continue to take Tylenol/Tramadol if needed for pain up until the day of surgery.  Stop ANY OVER THE COUNTER supplements/vitamins NOW (12-19-22) until after surgery.  Stop your rivaroxaban (XARELTO) 2 days prior to surgery-Last dose will be on 12-22-22 Monday  Stop your FARXIGA 3 days prior to surgery-Last dose will be on 12-21-22 Sunday  Stop your clopidogrel (PLAVIX) 6 days prior to surgery-Last dose was on 12-18-22 Thursday  Stop your OZEMPIC 7 days prior to surgery-Last dose was on 12-13-22-Do NOT take again until AFTER your surgery  Do NOT take any Insulin the morning of surgery  TAKE ONLY THESE MEDICATIONS THE MORNING OF SURGERY WITH A SIP OF WATER: -atorvastatin (LIPITOR)  -pantoprazole (PROTONIX) -take one the night before your surgery and one the morning of surgery  No Alcohol for 24 hours before or after surgery.  No Smoking  including e-cigarettes for 24 hours before surgery.  No chewable tobacco products for at least 6 hours before surgery.  No nicotine patches on the day of surgery.  Do not use any "recreational" drugs for at least a week (preferably 2 weeks) before your surgery.  Please be advised that the combination of cocaine and anesthesia may have negative outcomes, up to and including death. If you test positive for cocaine, your surgery will be cancelled.  On the morning of surgery brush your teeth with toothpaste and water, you may rinse your mouth with mouthwash if you wish. Do not swallow any toothpaste or mouthwash.  Use CHG Soap as directed on instruction sheet.  Do not wear jewelry, make-up, hairpins, clips or nail polish.  Do not wear lotions, powders, or perfumes.   Do not shave body hair from the neck down 48 hours before surgery.  Contact lenses, hearing aids and dentures may not be worn into surgery.  Do not bring valuables to the hospital. [facility] is not responsible for any missing/lost belongings or valuables.   Notify your doctor if there is any change in your medical condition (cold, fever, infection).  Wear comfortable clothing (specific to your surgery type) to the hospital.  After surgery, you can help prevent lung complications by doing breathing exercises.  Take deep breaths and cough every 1-2 hours. Your doctor may order a device called an Incentive Spirometer to help you take deep breaths. When coughing or sneezing, hold a pillow firmly against your incision with both hands. This is called "splinting." Doing this helps protect  your incision. It also decreases belly discomfort.  If you are being admitted to the hospital overnight, leave your suitcase in the car. After surgery it may be brought to your room.  In case of increased patient census, it may be necessary for you, the patient, to continue your postoperative care in the Same Day Surgery department.  If you  are being discharged the day of surgery, you will not be allowed to drive home. You will need a responsible individual to drive you home and stay with you for 24 hours after surgery.   If you are taking public transportation, you will need to have a responsible individual with you.  Please call the Pre-admissions Testing Dept. at [phone] if you have any questions about these instructions.  Surgery Visitation Policy:  Patients having surgery or a procedure may have two visitors.  Children under the age of 39 must have an adult with them who is not the patient.     Preparing for Surgery with CHLORHEXIDINE GLUCONATE (CHG) Soap  Chlorhexidine Gluconate (CHG) Soap  o An antiseptic cleaner that kills germs and bonds with the skin to continue killing germs even after washing  o Used for showering the night before surgery and morning of surgery  Before surgery, you can play an important role by reducing the number of germs on your skin.  CHG (Chlorhexidine gluconate) soap is an antiseptic cleanser which kills germs and bonds with the skin to continue killing germs even after washing.  Please do not use if you have an allergy to CHG or antibacterial soaps. If your skin becomes reddened/irritated stop using the CHG.  1. Shower the NIGHT BEFORE SURGERY and the MORNING OF SURGERY with CHG soap.  2. If you choose to wash your hair, wash your hair first as usual with your normal shampoo.  3. After shampooing, rinse your hair and body thoroughly to remove the shampoo.  4. Use CHG as you would any other liquid soap. You can apply CHG directly to the skin and wash gently with a scrungie or a clean washcloth.  5. Apply the CHG soap to your body only from the neck down. Do not use on open wounds or open sores. Avoid contact with your eyes, ears, mouth, and genitals (private parts). Wash face and genitals (private parts) with your normal soap.  6. Wash thoroughly, paying special attention to  the area where your surgery will be performed.  7. Thoroughly rinse your body with warm water.  8. Do not shower/wash with your normal soap after using and rinsing off the CHG soap.  9. Pat yourself dry with a clean towel.  10. Wear clean pajamas to bed the night before surgery.  12. Place clean sheets on your bed the night of your first shower and do not sleep with pets.  13. Shower again with the CHG soap on the day of surgery prior to arriving at the hospital.  14. Do not apply any deodorants/lotions/powders.  15. Please wear clean clothes to the hospital.

## 2022-12-25 ENCOUNTER — Ambulatory Visit
Admission: RE | Admit: 2022-12-25 | Discharge: 2022-12-25 | Disposition: A | Discharge status: Home / Self Care | Payer: BC Managed Care – PPO | Attending: Surgery | Admitting: Surgery

## 2022-12-25 ENCOUNTER — Encounter
Admission: RE | Disposition: A | Discharge status: Home / Self Care | Payer: Self-pay | Source: Home / Self Care | Attending: Surgery

## 2022-12-25 ENCOUNTER — Other Ambulatory Visit: Payer: Self-pay

## 2022-12-25 ENCOUNTER — Encounter: Payer: Self-pay | Admitting: Surgery

## 2022-12-25 ENCOUNTER — Ambulatory Visit: Payer: BC Managed Care – PPO | Admitting: Urgent Care

## 2022-12-25 DIAGNOSIS — Z7901 Long term (current) use of anticoagulants: Secondary | ICD-10-CM | POA: Insufficient documentation

## 2022-12-25 DIAGNOSIS — E1151 Type 2 diabetes mellitus with diabetic peripheral angiopathy without gangrene: Secondary | ICD-10-CM | POA: Diagnosis not present

## 2022-12-25 DIAGNOSIS — Z794 Long term (current) use of insulin: Secondary | ICD-10-CM | POA: Insufficient documentation

## 2022-12-25 DIAGNOSIS — Z87891 Personal history of nicotine dependence: Secondary | ICD-10-CM | POA: Diagnosis not present

## 2022-12-25 DIAGNOSIS — Z01818 Encounter for other preprocedural examination: Secondary | ICD-10-CM

## 2022-12-25 DIAGNOSIS — Z09 Encounter for follow-up examination after completed treatment for conditions other than malignant neoplasm: Secondary | ICD-10-CM | POA: Insufficient documentation

## 2022-12-25 DIAGNOSIS — K4091 Unilateral inguinal hernia, without obstruction or gangrene, recurrent: Secondary | ICD-10-CM | POA: Diagnosis not present

## 2022-12-25 DIAGNOSIS — K219 Gastro-esophageal reflux disease without esophagitis: Secondary | ICD-10-CM | POA: Insufficient documentation

## 2022-12-25 DIAGNOSIS — K8064 Calculus of gallbladder and bile duct with chronic cholecystitis without obstruction: Secondary | ICD-10-CM | POA: Insufficient documentation

## 2022-12-25 DIAGNOSIS — K8020 Calculus of gallbladder without cholecystitis without obstruction: Secondary | ICD-10-CM

## 2022-12-25 DIAGNOSIS — Z7902 Long term (current) use of antithrombotics/antiplatelets: Secondary | ICD-10-CM | POA: Insufficient documentation

## 2022-12-25 DIAGNOSIS — K8010 Calculus of gallbladder with chronic cholecystitis without obstruction: Secondary | ICD-10-CM

## 2022-12-25 LAB — GLUCOSE, CAPILLARY
Glucose-Capillary: 277 mg/dL — ABNORMAL HIGH (ref 70–99)
Glucose-Capillary: 244 mg/dL — ABNORMAL HIGH (ref 70–99)

## 2022-12-25 SURGERY — CHOLECYSTECTOMY, ROBOT-ASSISTED, LAPAROSCOPIC
Anesthesia: General | Site: Abdomen

## 2022-12-25 MED ORDER — SEVOFLURANE IN SOLN
RESPIRATORY_TRACT | Status: AC
  Filled 2022-12-25: qty 250

## 2022-12-25 MED ORDER — HYDROMORPHONE HCL 1 MG/ML IJ SOLN
INTRAMUSCULAR | Status: DC | PRN
  Administered 2022-12-25 (×2): .5 mg via INTRAVENOUS

## 2022-12-25 MED ORDER — OXYCODONE HCL 5 MG/5ML PO SOLN: 5.0000 mg | Freq: Once | ORAL | Status: AC | PRN

## 2022-12-25 MED ORDER — FENTANYL CITRATE (PF) 100 MCG/2ML IJ SOLN
INTRAMUSCULAR | Status: AC
  Filled 2022-12-25: qty 2

## 2022-12-25 MED ORDER — PROPOFOL 10 MG/ML IV BOLUS
INTRAVENOUS | Status: DC | PRN
  Administered 2022-12-25: 200 mg via INTRAVENOUS

## 2022-12-25 MED ORDER — DEXAMETHASONE SODIUM PHOSPHATE 10 MG/ML IJ SOLN
INTRAMUSCULAR | Status: DC | PRN
  Administered 2022-12-25: 4 mg via INTRAVENOUS

## 2022-12-25 MED ORDER — EPINEPHRINE PF 1 MG/ML IJ SOLN
INTRAMUSCULAR | Status: AC
  Filled 2022-12-25: qty 1

## 2022-12-25 MED ORDER — DEXAMETHASONE SODIUM PHOSPHATE 10 MG/ML IJ SOLN
INTRAMUSCULAR | Status: AC
  Filled 2022-12-25: qty 1

## 2022-12-25 MED ORDER — PROPOFOL 10 MG/ML IV BOLUS
INTRAVENOUS | Status: AC
  Filled 2022-12-25: qty 20

## 2022-12-25 MED ORDER — SODIUM CHLORIDE 0.9 % IV SOLN: INTRAVENOUS | Status: DC

## 2022-12-25 MED ORDER — LIDOCAINE HCL (CARDIAC) PF 100 MG/5ML IV SOSY
PREFILLED_SYRINGE | INTRAVENOUS | Status: DC | PRN
  Administered 2022-12-25: 60 mg via INTRAVENOUS

## 2022-12-25 MED ORDER — HYDROMORPHONE HCL 1 MG/ML IJ SOLN
INTRAMUSCULAR | Status: AC
  Filled 2022-12-25: qty 1

## 2022-12-25 MED ORDER — ONDANSETRON HCL 4 MG/2ML IJ SOLN
INTRAMUSCULAR | Status: AC
  Filled 2022-12-25: qty 2

## 2022-12-25 MED ORDER — CEFAZOLIN SODIUM-DEXTROSE 2-4 GM/100ML-% IV SOLN
2.0000 g | INTRAVENOUS | Status: AC
  Administered 2022-12-25: 2 g via INTRAVENOUS

## 2022-12-25 MED ORDER — INSULIN ASPART 100 UNIT/ML IJ SOLN
INTRAMUSCULAR | Status: AC
  Filled 2022-12-25: qty 1

## 2022-12-25 MED ORDER — CHLORHEXIDINE GLUCONATE CLOTH 2 % EX PADS
6.0000 | MEDICATED_PAD | Freq: Once | CUTANEOUS | Status: AC
  Administered 2022-12-25: 6 via TOPICAL

## 2022-12-25 MED ORDER — GABAPENTIN 300 MG PO CAPS
300.0000 mg | ORAL_CAPSULE | ORAL | Status: AC
  Administered 2022-12-25: 300 mg via ORAL

## 2022-12-25 MED ORDER — MIDAZOLAM HCL 2 MG/2ML IJ SOLN
INTRAMUSCULAR | Status: DC | PRN
  Administered 2022-12-25: 2 mg via INTRAVENOUS

## 2022-12-25 MED ORDER — BUPIVACAINE-EPINEPHRINE (PF) 0.25% -1:200000 IJ SOLN
INTRAMUSCULAR | Status: DC | PRN
  Administered 2022-12-25: 50 mL via INTRAMUSCULAR

## 2022-12-25 MED ORDER — DROPERIDOL 2.5 MG/ML IJ SOLN
0.6250 mg | Freq: Once | INTRAMUSCULAR | Status: AC
  Administered 2022-12-25: 0.625 mg via INTRAVENOUS

## 2022-12-25 MED ORDER — GABAPENTIN 300 MG PO CAPS
ORAL_CAPSULE | ORAL | Status: AC
  Filled 2022-12-25: qty 1

## 2022-12-25 MED ORDER — HYDROCODONE-ACETAMINOPHEN 5-325 MG PO TABS
1.0000 | ORAL_TABLET | Freq: Four times a day (QID) | ORAL | 0 refills | Status: DC | PRN
Start: 2022-12-25 — End: 2023-01-06

## 2022-12-25 MED ORDER — SUGAMMADEX SODIUM 200 MG/2ML IV SOLN
INTRAVENOUS | Status: DC | PRN
  Administered 2022-12-25: 200 mg via INTRAVENOUS

## 2022-12-25 MED ORDER — LABETALOL HCL 5 MG/ML IV SOLN
INTRAVENOUS | Status: AC
  Filled 2022-12-25: qty 4

## 2022-12-25 MED ORDER — BUPIVACAINE LIPOSOME 1.3 % IJ SUSP
INTRAMUSCULAR | Status: AC
  Filled 2022-12-25: qty 20

## 2022-12-25 MED ORDER — STERILE WATER FOR IRRIGATION IR SOLN
Status: DC | PRN
  Administered 2022-12-25: 1500 mL

## 2022-12-25 MED ORDER — INSULIN ASPART 100 UNIT/ML IJ SOLN
6.0000 [IU] | Freq: Once | INTRAMUSCULAR | Status: AC
  Administered 2022-12-25: 6 [IU] via SUBCUTANEOUS

## 2022-12-25 MED ORDER — DROPERIDOL 2.5 MG/ML IJ SOLN
INTRAMUSCULAR | Status: AC
  Filled 2022-12-25: qty 2

## 2022-12-25 MED ORDER — FENTANYL CITRATE (PF) 100 MCG/2ML IJ SOLN
INTRAMUSCULAR | Status: DC | PRN
  Administered 2022-12-25 (×2): 50 ug via INTRAVENOUS

## 2022-12-25 MED ORDER — PHENYLEPHRINE 80 MCG/ML (10ML) SYRINGE FOR IV PUSH (FOR BLOOD PRESSURE SUPPORT)
PREFILLED_SYRINGE | INTRAVENOUS | Status: AC
  Filled 2022-12-25: qty 10

## 2022-12-25 MED ORDER — CHLORHEXIDINE GLUCONATE 0.12 % MT SOLN
OROMUCOSAL | Status: AC
  Filled 2022-12-25: qty 15

## 2022-12-25 MED ORDER — LABETALOL HCL 5 MG/ML IV SOLN
INTRAVENOUS | Status: DC | PRN
  Administered 2022-12-25: 5 mg via INTRAVENOUS

## 2022-12-25 MED ORDER — CEFAZOLIN SODIUM-DEXTROSE 2-4 GM/100ML-% IV SOLN
INTRAVENOUS | Status: AC
  Filled 2022-12-25: qty 100

## 2022-12-25 MED ORDER — MIDAZOLAM HCL 2 MG/2ML IJ SOLN
INTRAMUSCULAR | Status: AC
  Filled 2022-12-25: qty 2

## 2022-12-25 MED ORDER — PHENYLEPHRINE HCL (PRESSORS) 10 MG/ML IV SOLN
INTRAVENOUS | Status: DC | PRN
  Administered 2022-12-25 (×3): 100 ug via INTRAVENOUS

## 2022-12-25 MED ORDER — OXYCODONE HCL 5 MG PO TABS
5.0000 mg | ORAL_TABLET | Freq: Once | ORAL | Status: AC | PRN
  Administered 2022-12-25: 5 mg via ORAL

## 2022-12-25 MED ORDER — ACETAMINOPHEN 500 MG PO TABS
1000.0000 mg | ORAL_TABLET | ORAL | Status: AC
  Administered 2022-12-25: 1000 mg via ORAL

## 2022-12-25 MED ORDER — ORAL CARE MOUTH RINSE: 15.0000 mL | Freq: Once | OROMUCOSAL | Status: AC

## 2022-12-25 MED ORDER — 0.9 % SODIUM CHLORIDE (POUR BTL) OPTIME
TOPICAL | Status: DC | PRN
  Administered 2022-12-25: 500 mL

## 2022-12-25 MED ORDER — ACETAMINOPHEN 500 MG PO TABS
ORAL_TABLET | ORAL | Status: AC
  Filled 2022-12-25: qty 2

## 2022-12-25 MED ORDER — CHLORHEXIDINE GLUCONATE 0.12 % MT SOLN
15.0000 mL | Freq: Once | OROMUCOSAL | Status: AC
  Administered 2022-12-25: 15 mL via OROMUCOSAL

## 2022-12-25 MED ORDER — LACTATED RINGERS IV SOLN: INTRAVENOUS | Status: DC | PRN

## 2022-12-25 MED ORDER — INDOCYANINE GREEN 25 MG IV SOLR
2.5000 mg | Freq: Once | INTRAVENOUS | Status: AC
  Administered 2022-12-25: 2.5 mg via INTRAVENOUS
  Filled 2022-12-25: qty 1

## 2022-12-25 MED ORDER — OXYCODONE HCL 5 MG PO TABS
ORAL_TABLET | ORAL | Status: AC
  Filled 2022-12-25: qty 1

## 2022-12-25 MED ORDER — ONDANSETRON HCL 4 MG/2ML IJ SOLN
INTRAMUSCULAR | Status: DC | PRN
  Administered 2022-12-25: 4 mg via INTRAVENOUS

## 2022-12-25 MED ORDER — CHLORHEXIDINE GLUCONATE CLOTH 2 % EX PADS: 6.0000 | MEDICATED_PAD | Freq: Once | CUTANEOUS | Status: DC

## 2022-12-25 MED ORDER — ROCURONIUM BROMIDE 100 MG/10ML IV SOLN
INTRAVENOUS | Status: DC | PRN
  Administered 2022-12-25: 20 mg via INTRAVENOUS
  Administered 2022-12-25: 10 mg via INTRAVENOUS
  Administered 2022-12-25: 50 mg via INTRAVENOUS

## 2022-12-25 MED ORDER — BUPIVACAINE HCL (PF) 0.25 % IJ SOLN
INTRAMUSCULAR | Status: AC
  Filled 2022-12-25: qty 30

## 2022-12-25 MED ORDER — FENTANYL CITRATE (PF) 100 MCG/2ML IJ SOLN: 25.0000 ug | INTRAMUSCULAR | Status: DC | PRN

## 2022-12-25 SURGICAL SUPPLY — 47 items
ADH SKN CLS APL DERMABOND .7 (GAUZE/BANDAGES/DRESSINGS) ×2
BULB RESERV EVAC DRAIN JP 100C (MISCELLANEOUS) IMPLANT
CANNULA REDUCER 12-8 DVNC XI (CANNULA) ×2 IMPLANT
CAUTERY HOOK MNPLR 1.6 DVNC XI (INSTRUMENTS) ×2 IMPLANT
CLIP LIGATING HEM O LOK PURPLE (MISCELLANEOUS) IMPLANT
CLIP LIGATING HEMO O LOK GREEN (MISCELLANEOUS) ×2 IMPLANT
DERMABOND ADVANCED .7 DNX12 (GAUZE/BANDAGES/DRESSINGS) ×2 IMPLANT
DRAIN CHANNEL JP 19F (MISCELLANEOUS) IMPLANT
DRAPE ARM DVNC X/XI (DISPOSABLE) ×8 IMPLANT
DRAPE COLUMN DVNC XI (DISPOSABLE) ×2 IMPLANT
ELECT CAUTERY BLADE 6.4 (BLADE) ×2 IMPLANT
ELECT REM PT RETURN 9FT ADLT (ELECTROSURGICAL) ×2
ELECTRODE REM PT RTRN 9FT ADLT (ELECTROSURGICAL) ×2 IMPLANT
FORCEPS BPLR R/ABLATION 8 DVNC (INSTRUMENTS) ×2 IMPLANT
FORCEPS PROGRASP DVNC XI (FORCEP) ×2 IMPLANT
GLOVE BIO SURGEON STRL SZ7 (GLOVE) ×4 IMPLANT
GOWN STRL REUS W/ TWL LRG LVL3 (GOWN DISPOSABLE) ×8 IMPLANT
GOWN STRL REUS W/TWL LRG LVL3 (GOWN DISPOSABLE) ×8
IRRIGATION STRYKERFLOW (MISCELLANEOUS) IMPLANT
IRRIGATOR STRYKERFLOW (MISCELLANEOUS) ×2
KIT PINK PAD W/HEAD ARE REST (MISCELLANEOUS) ×2
KIT PINK PAD W/HEAD ARM REST (MISCELLANEOUS) ×2 IMPLANT
LABEL OR SOLS (LABEL) ×2 IMPLANT
MANIFOLD NEPTUNE II (INSTRUMENTS) ×2 IMPLANT
NDL HYPO 22X1.5 SAFETY MO (MISCELLANEOUS) ×2 IMPLANT
NEEDLE HYPO 22X1.5 SAFETY MO (MISCELLANEOUS) ×2 IMPLANT
NS IRRIG 500ML POUR BTL (IV SOLUTION) ×2 IMPLANT
OBTURATOR OPTICAL STND 8 DVNC (TROCAR) ×2
OBTURATOR OPTICALSTD 8 DVNC (TROCAR) ×2 IMPLANT
PACK LAP CHOLECYSTECTOMY (MISCELLANEOUS) ×2 IMPLANT
PENCIL SMOKE EVACUATOR (MISCELLANEOUS) ×2 IMPLANT
SEAL UNIV 5-12 XI (MISCELLANEOUS) ×8 IMPLANT
SET TUBE SMOKE EVAC HIGH FLOW (TUBING) ×2 IMPLANT
SOL ELECTROSURG ANTI STICK (MISCELLANEOUS) ×2
SOLUTION ELECTROSURG ANTI STCK (MISCELLANEOUS) ×2 IMPLANT
SPONGE DRAIN TRACH 4X4 STRL 2S (GAUZE/BANDAGES/DRESSINGS) IMPLANT
SPONGE T-LAP 18X18 ~~LOC~~+RFID (SPONGE) ×2 IMPLANT
SPONGE T-LAP 4X18 ~~LOC~~+RFID (SPONGE) IMPLANT
SUT ETHILON 3-0 FS-10 30 BLK (SUTURE) ×2
SUT MNCRL AB 4-0 PS2 18 (SUTURE) ×2 IMPLANT
SUT VICRYL 0 UR6 27IN ABS (SUTURE) ×4 IMPLANT
SUTURE EHLN 3-0 FS-10 30 BLK (SUTURE) IMPLANT
SYR 20ML LL LF (SYRINGE) IMPLANT
SYS BAG RETRIEVAL 10MM (BASKET) ×2
SYSTEM BAG RETRIEVAL 10MM (BASKET) ×2 IMPLANT
TRAP FLUID SMOKE EVACUATOR (MISCELLANEOUS) ×2 IMPLANT
WATER STERILE IRR 3000ML UROMA (IV SOLUTION) IMPLANT

## 2022-12-25 NOTE — Transfer of Care (Signed)
Immediate Anesthesia Transfer of Care Note  Patient: Justin Adkins  Procedure(s) Performed: XI ROBOTIC ASSISTED LAPAROSCOPIC CHOLECYSTECTOMY (Abdomen) INDOCYANINE GREEN FLUORESCENCE IMAGING (ICG)  Patient Location: PACU  Anesthesia Type:General  Level of Consciousness: sedated  Airway & Oxygen Therapy: Patient Spontanous Breathing and Patient connected to face mask oxygen  Post-op Assessment: Report given to RN and Post -op Vital signs reviewed and stable  Post vital signs: Reviewed and stable  Last Vitals:  Vitals Value Taken Time  BP 173/91 12/25/22 0937  Temp 36.8 C 12/25/22 0935  Pulse 82 12/25/22 0940  Resp 22 12/25/22 0940  SpO2 96 % 12/25/22 0940  Vitals shown include unvalidated device data.  Last Pain:  Vitals:   12/25/22 0635  TempSrc: Temporal  PainSc: 0-No pain         Complications: No notable events documented.

## 2022-12-25 NOTE — Anesthesia Procedure Notes (Signed)
Procedure Name: Intubation Date/Time: 12/25/2022 7:36 AM  Performed by: Monico Hoar, CRNAPre-anesthesia Checklist: Patient identified, Patient being monitored, Timeout performed, Emergency Drugs available and Suction available Patient Re-evaluated:Patient Re-evaluated prior to induction Oxygen Delivery Method: Circle system utilized Preoxygenation: Pre-oxygenation with 100% oxygen Induction Type: IV induction Ventilation: Mask ventilation without difficulty Laryngoscope Size: Mac and 4 Grade View: Grade I Tube type: Oral Tube size: 7.5 mm Number of attempts: 1 Airway Equipment and Method: Stylet Placement Confirmation: ETT inserted through vocal cords under direct vision, positive ETCO2 and breath sounds checked- equal and bilateral Secured at: 22 cm Tube secured with: Tape Dental Injury: Teeth and Oropharynx as per pre-operative assessment

## 2022-12-25 NOTE — Discharge Instructions (Addendum)
Laparoscopic Cholecystectomy, Care After   These instructions give you information on caring for yourself after your procedure. Your doctor may also give you more specific instructions. Call your doctor if you have any problems or questions after your procedure.  HOME CARE  Change your bandages (dressings) as told by your doctor.  Keep the wound dry and clean. Wash the wound gently with soap and water. Pat the wound dry with a clean towel.  Do not take baths, swim, or use hot tubs for 2 weeks, or as told by your doctor.  Only take medicine as told by your doctor.  Eat a normal diet as told by your doctor.  Do not lift anything heavier than 10 pounds (4.5 kg) until your doctor says it is okay.  Do not play contact sports for 1 week, or as told by your doctor. GET HELP IF:  Your wound is red, puffy (swollen), or painful.  You have yellowish-white fluid (pus) coming from the wound.  You have fluid draining from the wound for more than 1 day.  You have a bad smell coming from the wound.  Your wound breaks open. GET HELP RIGHT AWAY IF:  You have trouble breathing.  You have chest pain.  You have a fever >101  You have pain in the shoulders (shoulder strap areas) that is getting worse.  You feel dizzy or pass out (faint).  You have severe belly (abdominal) pain.  You feel sick to your stomach (nauseous) or throw up (vomit) for more than 1 day.   AMBULATORY SURGERY  DISCHARGE INSTRUCTIONS   The drugs that you were given will stay in your system until tomorrow so for the next 24 hours you should not:  Drive an automobile Make any legal decisions Drink any alcoholic beverage   You may resume regular meals tomorrow.  Today it is better to start with liquids and gradually work up to solid foods.  You may eat anything you prefer, but it is better to start with liquids, then soup and crackers, and gradually work up to solid foods.   Please notify your doctor immediately if you have any  unusual bleeding, trouble breathing, redness and pain at the surgery site, drainage, fever, or pain not relieved by medication.   Information for Discharge Teaching:  DO NOT TAKE OFF TEAL EXPAREL BRACELET FOR 4 DAYS (96 hours) 12/29/2022 EXPAREL (bupivacaine liposome injectable suspension)   Your surgeon or anesthesiologist gave you EXPAREL(bupivacaine) to help control your pain after surgery.  EXPAREL is a local anesthetic that provides pain relief by numbing the tissue around the surgical site. EXPAREL is designed to release pain medication over time and can control pain for up to 72 hours. Depending on how you respond to EXPAREL, you may require less pain medication during your recovery.  Possible side effects: Temporary loss of sensation or ability to move in the area where bupivacaine was injected. Nausea, vomiting, constipation Rarely, numbness and tingling in your mouth or lips, lightheadedness, or anxiety may occur. Call your doctor right away if you think you may be experiencing any of these sensations, or if you have other questions regarding possible side effects.  Follow all other discharge instructions given to you by your surgeon or nurse. Eat a healthy diet and drink plenty of water or other fluids.  If you return to the hospital for any reason within 96 hours following the administration of EXPAREL, it is important for health care providers to know that you have received  this anesthetic. A teal colored band has been placed on your arm with the date, time and amount of EXPAREL you have received in order to alert and inform your health care providers. Please leave this armband in place for the full 96 hours following administration, and then you may remove the band.    Please contact your physician with any problems or Same Day Surgery at [phone], Monday through Friday 6 am to 4 pm, or [place] at [facility] number at [phone].

## 2022-12-25 NOTE — Op Note (Signed)
Robotic assisted laparoscopic Cholecystectomy  Pre-operative Diagnosis: chronic cholecystitis  Post-operative Diagnosis: same  Procedure:  Robotic assisted laparoscopic Cholecystectomy  Surgeon: Sterling Big, MD FACS  Anesthesia: Gen. with endotracheal tube  Findings: Chronic severe Cholecystitis  Very difficult case with lysis of adhesion taking about 40 minutes, modifier 22 applied due to the severity of the disease and complexity of the case.  Estimated Blood Loss: 20 cc       Specimens: Gallbladder           Complications: none   Procedure Details  The patient was seen again in the Holding Room. The benefits, complications, treatment options, and expected outcomes were discussed with the patient. The risks of bleeding, infection, recurrence of symptoms, failure to resolve symptoms, bile duct damage, bile duct leak, retained common bile duct stone, bowel injury, any of which could require further surgery and/or ERCP, stent, or papillotomy were reviewed with the patient. The likelihood of improving the patient's symptoms with return to their baseline status is good.  The patient and/or family concurred with the proposed plan, giving informed consent.  The patient was taken to Operating Room, identified  and the procedure verified as Laparoscopic Cholecystectomy.  A Time Out was held and the above information confirmed.  Prior to the induction of general anesthesia, antibiotic prophylaxis was administered. VTE prophylaxis was in place. General endotracheal anesthesia was then administered and tolerated well. After the induction, the abdomen was prepped with Chloraprep and draped in the sterile fashion. The patient was positioned in the supine position.  Cut down technique was used to enter the abdominal cavity and a Hasson trochar was placed after two vicryl stitches were anchored to the fascia. Pneumoperitoneum was then created with CO2 and tolerated well without any adverse changes in  the patient's vital signs.  Three 8-mm ports were placed under direct vision. All skin incisions  were infiltrated with a local anesthetic agent before making the incision and placing the trocars.   The patient was positioned  in reverse Trendelenburg, robot was brought to the surgical field and docked in the standard fashion.  We made sure all the instrumentation was kept indirect view at all times and that there were no collision between the arms. I scrubbed out and went to the console.  The gallbladder was identified, it was distended and found to have a large stone stuck to the neck and infundibulum. The fundus grasped and retracted cephalad. Adhesions were lysed bluntly.  Severe adhesions encountered, those were lysed sharply and where possible with cautery.   The infundibulum was grasped and retracted laterally, exposing the peritoneum overlying the triangle of Calot. Due to the severe inflammatory response I had to open the body of the GB to be able to define the anatomy. Bile was drained. This maneuver allow [place] to determine the walls of the GB. We also had to do dome down technique to delineate the anatomy.  An extended critical view of the cystic duct and cystic artery was obtained.  The cystic duct was clearly identified and bluntly dissected.   Artery and duct were double clipped and divided. The remaining  gallbladder was taken from the gallbladder fossa in  with the electrocautery.  Hemostasis was achieved with the electrocautery. Inspection of the right upper quadrant was performed. No bleeding, bile duct injury or leak, or bowel injury was noted. I felt that due to the severe inflammation I needed to place a drain just in case there was a duct of [place] that  was not identified due to the distortion of his anatomy. 19 FR blake placed and secured in the standard fashion. Abdominal cavity was irrigated with saline. Robotic instruments and robotic arms were undocked in the standard fashion.  I  scrubbed back in.  The gallbladder was removed and placed in an Endocatch bag.   Pneumoperitoneum was released.  The periumbilical port site was closed with interrumpted 0 Vicryl sutures. 4-0 subcuticular Monocryl was used to close the skin. Dermabond was  applied.  The patient was then extubated and brought to the recovery room in stable condition. Sponge, lap, and needle counts were correct at closure and at the conclusion of the case.               Sterling Big, MD, FACS

## 2022-12-25 NOTE — Anesthesia Preprocedure Evaluation (Signed)
Anesthesia Evaluation  Patient identified by MRN, date of birth, ID band Patient awake    Reviewed: Allergy & Precautions, NPO status , Patient's Chart, lab work & pertinent test results  History of Anesthesia Complications Negative for: history of anesthetic complications  Airway Mallampati: III  TM Distance: <3 FB Neck ROM: full    Dental  (+) Chipped, Poor Dentition   Pulmonary neg shortness of breath, Patient abstained from smoking., former smoker   Pulmonary exam normal        Cardiovascular Exercise Tolerance: Good hypertension, (-) angina (-) Past MI Normal cardiovascular exam     Neuro/Psych  Neuromuscular disease  negative psych ROS   GI/Hepatic PUD,GERD  Controlled,,  Endo/Other  diabetes, Type 2, Insulin Dependent    Renal/GU      Musculoskeletal   Abdominal   Peds  Hematology negative hematology ROS (+)   Anesthesia Other Findings Past Medical History: No date: Atherosclerosis of native arteries of extremity with  intermittent claudication (HCC) No date: ED (erectile dysfunction) No date: Elevated liver enzymes No date: GERD (gastroesophageal reflux disease) No date: History of echocardiogram     Comment:  a. 07/2020 Echo: EF 60-65%, no rwma, mild LVH. Nl RV               fxn. Mildly dil LA. No date: History of tobacco abuse No date: Hyperlipidemia No date: Hypertension No date: Peripheral vascular disease (HCC)     Comment:  a. 05/2019 s/p PTA R popliteal/AT, mech thrombectomy to               R popliteal/tibioperoneal trunk--> plavix & eliquis 2.5               bid since; 09/2019 LE u/s: near nl study. No date: Popliteal artery occlusion, right (HCC) No date: Type 2 diabetes mellitus (HCC) No date: Ulcerative colitis (HCC)  Past Surgical History: No date: COLONOSCOPY 09/24/2020: COLONOSCOPY; N/A     Comment:  Procedure: COLONOSCOPY;  Surgeon: Regis Bill,               MD;   Location: [facility] ENDOSCOPY;  Service: Endoscopy;                Laterality: N/A; 11/07/2022: COLONOSCOPY WITH PROPOFOL; N/A     Comment:  Procedure: COLONOSCOPY WITH PROPOFOL;  Surgeon:               Regis Bill, MD;  Location: [facility] ENDOSCOPY;                Service: Endoscopy;  Laterality: N/A; 02/28/2020: ESOPHAGOGASTRODUODENOSCOPY (EGD) WITH PROPOFOL; N/A     Comment:  Procedure: ESOPHAGOGASTRODUODENOSCOPY (EGD) WITH               PROPOFOL;  Surgeon: Regis Bill, MD;  Location:               [facility] ENDOSCOPY;  Service: Endoscopy;  Laterality: N/A; No date: FOOT SURGERY; Right No date: HERNIA REPAIR     Comment:  inguinal hernia 05/25/2019: LOWER EXTREMITY ANGIOGRAPHY; Right     Comment:  Procedure: LOWER EXTREMITY ANGIOGRAPHY;  Surgeon:               Renford Dills, MD;  Location: [facility] INVASIVE CV [facility];               Service: Cardiovascular;  Laterality: Right; 07/02/2021: LOWER EXTREMITY ANGIOGRAPHY; Right     Comment:  Procedure: LOWER EXTREMITY ANGIOGRAPHY;  Surgeon:  Schnier, Latina Craver, MD;  Location: [facility] INVASIVE CV LAB;               Service: Cardiovascular;  Laterality: Right; No date: TONSILLECTOMY No date: VASCULAR SURGERY  BMI    Body Mass Index: 25.09 kg/m      Reproductive/Obstetrics negative OB ROS                             Anesthesia Physical Anesthesia Plan  ASA: 3  Anesthesia Plan: General ETT   Post-op Pain Management:    Induction: Intravenous  PONV Risk Score and Plan: Ondansetron, Dexamethasone, Midazolam and Treatment may vary due to age or medical condition  Airway Management Planned: Oral ETT  Additional Equipment:   Intra-op Plan:   Post-operative Plan: Extubation in OR  Informed Consent: I have reviewed the patients History and Physical, chart, labs and discussed the procedure including the risks, benefits and alternatives for the proposed anesthesia with the patient or authorized  representative who has indicated his/her understanding and acceptance.     Dental Advisory Given  Plan Discussed with: Anesthesiologist, CRNA and Surgeon  Anesthesia Plan Comments: (Patient consented for risks of anesthesia including but not limited to:  - adverse reactions to medications - damage to eyes, teeth, lips or other oral mucosa - nerve damage due to positioning  - sore throat or hoarseness - Damage to heart, brain, nerves, lungs, other parts of body or loss of life  Patient voiced understanding.)       Anesthesia Quick Evaluation

## 2022-12-25 NOTE — Interval H&P Note (Signed)
History and Physical Interval Note:  12/25/2022 7:13 AM  Justin Adkins  has presented today for surgery, with the diagnosis of biliary colic.  The various methods of treatment have been discussed with the patient and family. After consideration of risks, benefits and other options for treatment, the patient has consented to  Procedure(s): XI ROBOTIC ASSISTED LAPAROSCOPIC CHOLECYSTECTOMY (N/A) INDOCYANINE GREEN FLUORESCENCE IMAGING (ICG) (N/A) as a surgical intervention.  The patient's history has been reviewed, patient examined, no change in status, stable for surgery.  I have reviewed the patient's chart and labs.  Questions were answered to the patient's satisfaction.     Herschell Virani F Ariza Evans

## 2022-12-25 NOTE — Anesthesia Postprocedure Evaluation (Signed)
Anesthesia Post Note  Patient: Justin Adkins  Procedure(s) Performed: XI ROBOTIC ASSISTED LAPAROSCOPIC CHOLECYSTECTOMY (Abdomen) INDOCYANINE GREEN FLUORESCENCE IMAGING (ICG)  Patient location during evaluation: PACU Anesthesia Type: General Level of consciousness: awake and alert Pain management: pain level controlled Vital Signs Assessment: post-procedure vital signs reviewed and stable Respiratory status: spontaneous breathing, nonlabored ventilation, respiratory function stable and patient connected to nasal cannula oxygen Cardiovascular status: blood pressure returned to baseline and stable Postop Assessment: no apparent nausea or vomiting Anesthetic complications: no   No notable events documented.   Last Vitals:  Vitals:   12/25/22 1045 12/25/22 1105  BP: 125/74 (!) 141/100  Pulse: 75 90  Resp: 18 18  Temp: 36.7 C   SpO2: 91% 92%    Last Pain:  Vitals:   12/25/22 1045  TempSrc:   PainSc: 6                  Cleda Mccreedy Coda Filler

## 2022-12-26 ENCOUNTER — Telehealth: Payer: Self-pay | Admitting: Surgery

## 2022-12-26 LAB — SURGICAL PATHOLOGY

## 2022-12-26 NOTE — Telephone Encounter (Signed)
This patient had robotic chole done on 12/25/22 with Dr. Everlene Farrier.  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  He does do a lot of lifting, bending and stooping. Wants to know when he can return back to work and what his limitations will be.  Needs a work note for when please.  Call patient when work note ready and he will have a family member come by to pick up for him.  Thank you.

## 2022-12-31 ENCOUNTER — Ambulatory Visit (INDEPENDENT_AMBULATORY_CARE_PROVIDER_SITE_OTHER): Payer: BC Managed Care – PPO | Admitting: Surgery

## 2022-12-31 ENCOUNTER — Encounter: Payer: Self-pay | Admitting: Surgery

## 2022-12-31 ENCOUNTER — Other Ambulatory Visit: Payer: Self-pay

## 2022-12-31 VITALS — BP 148/94 | HR 102 | Temp 98.0°F | Ht 68.0 in | Wt 147.0 lb

## 2022-12-31 DIAGNOSIS — Z09 Encounter for follow-up examination after completed treatment for conditions other than malignant neoplasm: Secondary | ICD-10-CM

## 2022-12-31 DIAGNOSIS — K8010 Calculus of gallbladder with chronic cholecystitis without obstruction: Secondary | ICD-10-CM

## 2022-12-31 DIAGNOSIS — K810 Acute cholecystitis: Secondary | ICD-10-CM

## 2022-12-31 DIAGNOSIS — G8918 Other acute postprocedural pain: Secondary | ICD-10-CM

## 2022-12-31 DIAGNOSIS — R1084 Generalized abdominal pain: Secondary | ICD-10-CM

## 2022-12-31 MED ORDER — OXYCODONE HCL 5 MG PO TABS: 5.0000 mg | ORAL_TABLET | ORAL | 0 refills | Status: DC | PRN

## 2022-12-31 NOTE — Patient Instructions (Addendum)
We will get you scheduled for a CT scan to better assess the surgical area.   You are scheduled for a CT scan at [facility] on Friday May 17th. You will need to arrive there at 2:00 pm. No food 4 hours prior to your exam, but you may have water.  We will call you with your results.   You may take Tylenol 2 tablets every 6 hours. You may take this along with the prescription pain medication. Use ice to the area for 15 minutes on and 2 hours off. You can also use heat if that is better for you.   Follow up here in 2 weeks.  GENERAL POST-OPERATIVE PATIENT INSTRUCTIONS   WOUND CARE INSTRUCTIONS: Try to keep the wound dry and avoid ointments on the wound unless directed to do so.  If the wound becomes bright red and painful or starts to drain infected material that is not clear, please contact your physician immediately.  If the wound is mildly pink and has a thick firm ridge underneath it, this is normal, and is referred to as a healing ridge.  This will resolve over the next 4-6 weeks.  BATHING: You may shower if you have been informed of this by your surgeon. However, Please do not submerge in a tub, hot tub, or pool until incisions are completely sealed or have been told by your surgeon that you may do so.  DIET:  You may eat any foods that you can tolerate.  It is a good idea to eat a high fiber diet and take in plenty of fluids to prevent constipation.  If you do become constipated you may want to take a mild laxative or take ducolax tablets on a daily basis until your bowel habits are regular.  Constipation can be very uncomfortable, along with straining, after recent surgery.  ACTIVITY:  You may want to hug a pillow when coughing and sneezing to add additional support to the surgical area, if you had abdominal or chest surgery, which will decrease pain during these times.  You are encouraged to walk and engage in light activity for the next two weeks.  You should not lift  more than 20 pounds for 6 weeks total after surgery as it could put you at increased risk for complications.  Twenty pounds is roughly equivalent to a plastic bag of groceries. At that time- Listen to your body when lifting, if you have pain when lifting, stop and then try again in a few days. Soreness after doing exercises or activities of daily living is normal as you get back in to your normal routine.  MEDICATIONS:  Try to take narcotic medications and anti-inflammatory medications, such as tylenol, ibuprofen, naprosyn, etc., with food.  This will minimize stomach upset from the medication.  Should you develop nausea and vomiting from the pain medication, or develop a rash, please discontinue the medication and contact your physician.  You should not drive, make important decisions, or operate machinery when taking narcotic pain medication.  SUNBLOCK Use sun block to incision area over the next year if this area will be exposed to sun. This helps decrease scarring and will allow you avoid a permanent darkened area over your incision.  QUESTIONS:  Please feel free to call our office if you have any questions, and we will be glad to assist you. [phone]

## 2023-01-02 ENCOUNTER — Inpatient Hospital Stay
Admission: EM | Admit: 2023-01-02 | Discharge: 2023-01-06 | DRG: 637 | Disposition: A | Discharge status: Home / Self Care | Payer: BC Managed Care – PPO | Attending: Internal Medicine | Admitting: Internal Medicine

## 2023-01-02 ENCOUNTER — Other Ambulatory Visit: Payer: Self-pay

## 2023-01-02 ENCOUNTER — Ambulatory Visit
Admission: RE | Admit: 2023-01-02 | Discharge: 2023-01-02 | Disposition: A | Discharge status: Home / Self Care | Payer: BC Managed Care – PPO | Source: Ambulatory Visit | Attending: Surgery | Admitting: Surgery

## 2023-01-02 DIAGNOSIS — K5190 Ulcerative colitis, unspecified, without complications: Secondary | ICD-10-CM | POA: Insufficient documentation

## 2023-01-02 DIAGNOSIS — Z794 Long term (current) use of insulin: Secondary | ICD-10-CM

## 2023-01-02 DIAGNOSIS — K651 Peritoneal abscess: Secondary | ICD-10-CM

## 2023-01-02 DIAGNOSIS — Z809 Family history of malignant neoplasm, unspecified: Secondary | ICD-10-CM | POA: Diagnosis not present

## 2023-01-02 DIAGNOSIS — E876 Hypokalemia: Secondary | ICD-10-CM | POA: Diagnosis present

## 2023-01-02 DIAGNOSIS — Z7901 Long term (current) use of anticoagulants: Secondary | ICD-10-CM

## 2023-01-02 DIAGNOSIS — Z79899 Other long term (current) drug therapy: Secondary | ICD-10-CM | POA: Diagnosis not present

## 2023-01-02 DIAGNOSIS — E1151 Type 2 diabetes mellitus with diabetic peripheral angiopathy without gangrene: Secondary | ICD-10-CM | POA: Diagnosis present

## 2023-01-02 DIAGNOSIS — D75839 Thrombocytosis, unspecified: Secondary | ICD-10-CM | POA: Diagnosis present

## 2023-01-02 DIAGNOSIS — Z886 Allergy status to analgesic agent status: Secondary | ICD-10-CM | POA: Diagnosis not present

## 2023-01-02 DIAGNOSIS — E785 Hyperlipidemia, unspecified: Secondary | ICD-10-CM | POA: Insufficient documentation

## 2023-01-02 DIAGNOSIS — Z87891 Personal history of nicotine dependence: Secondary | ICD-10-CM | POA: Diagnosis not present

## 2023-01-02 DIAGNOSIS — Z8249 Family history of ischemic heart disease and other diseases of the circulatory system: Secondary | ICD-10-CM

## 2023-01-02 DIAGNOSIS — E1142 Type 2 diabetes mellitus with diabetic polyneuropathy: Secondary | ICD-10-CM

## 2023-01-02 DIAGNOSIS — Z7902 Long term (current) use of antithrombotics/antiplatelets: Secondary | ICD-10-CM | POA: Diagnosis not present

## 2023-01-02 DIAGNOSIS — T383X6A Underdosing of insulin and oral hypoglycemic [antidiabetic] drugs, initial encounter: Secondary | ICD-10-CM | POA: Diagnosis present

## 2023-01-02 DIAGNOSIS — I10 Essential (primary) hypertension: Secondary | ICD-10-CM | POA: Diagnosis present

## 2023-01-02 DIAGNOSIS — E1110 Type 2 diabetes mellitus with ketoacidosis without coma: Secondary | ICD-10-CM | POA: Diagnosis present

## 2023-01-02 DIAGNOSIS — E871 Hypo-osmolality and hyponatremia: Secondary | ICD-10-CM | POA: Diagnosis not present

## 2023-01-02 DIAGNOSIS — K5900 Constipation, unspecified: Secondary | ICD-10-CM | POA: Diagnosis present

## 2023-01-02 DIAGNOSIS — Z7985 Long-term (current) use of injectable non-insulin antidiabetic drugs: Secondary | ICD-10-CM | POA: Diagnosis not present

## 2023-01-02 DIAGNOSIS — T8143XA Infection following a procedure, organ and space surgical site, initial encounter: Secondary | ICD-10-CM | POA: Diagnosis not present

## 2023-01-02 DIAGNOSIS — E118 Type 2 diabetes mellitus with unspecified complications: Secondary | ICD-10-CM | POA: Diagnosis not present

## 2023-01-02 DIAGNOSIS — Z888 Allergy status to other drugs, medicaments and biological substances status: Secondary | ICD-10-CM | POA: Diagnosis not present

## 2023-01-02 DIAGNOSIS — R188 Other ascites: Secondary | ICD-10-CM

## 2023-01-02 DIAGNOSIS — K668 Other specified disorders of peritoneum: Secondary | ICD-10-CM

## 2023-01-02 DIAGNOSIS — Z91128 Patient's intentional underdosing of medication regimen for other reason: Secondary | ICD-10-CM

## 2023-01-02 DIAGNOSIS — R935 Abnormal findings on diagnostic imaging of other abdominal regions, including retroperitoneum: Secondary | ICD-10-CM | POA: Diagnosis not present

## 2023-01-02 DIAGNOSIS — K219 Gastro-esophageal reflux disease without esophagitis: Secondary | ICD-10-CM | POA: Insufficient documentation

## 2023-01-02 DIAGNOSIS — R1084 Generalized abdominal pain: Secondary | ICD-10-CM

## 2023-01-02 DIAGNOSIS — K51919 Ulcerative colitis, unspecified with unspecified complications: Secondary | ICD-10-CM | POA: Diagnosis not present

## 2023-01-02 LAB — COMPREHENSIVE METABOLIC PANEL
ALT: 12 U/L (ref 0–44)
AST: 11 U/L — ABNORMAL LOW (ref 15–41)
Albumin: 3.6 g/dL (ref 3.5–5.0)
Alkaline Phosphatase: 129 U/L — ABNORMAL HIGH (ref 38–126)
Anion gap: 24 — ABNORMAL HIGH (ref 5–15)
BUN: 23 mg/dL — ABNORMAL HIGH (ref 6–20)
CO2: 11 mmol/L — ABNORMAL LOW (ref 22–32)
Calcium: 9.4 mg/dL (ref 8.9–10.3)
Chloride: 94 mmol/L — ABNORMAL LOW (ref 98–111)
Creatinine, Ser: 0.94 mg/dL (ref 0.61–1.24)
GFR, Estimated: >60 mL/min (ref >60)
Glucose, Bld: 366 mg/dL — ABNORMAL HIGH (ref 70–99)
Potassium: 4.1 mmol/L (ref 3.5–5.1)
Sodium: 129 mmol/L — ABNORMAL LOW (ref 135–145)
Total Bilirubin: 2.1 mg/dL — ABNORMAL HIGH (ref 0.3–1.2)
Total Protein: 8.1 g/dL (ref 6.5–8.1)

## 2023-01-02 LAB — CBC WITH DIFFERENTIAL/PLATELET
Abs Immature Granulocytes: 0.08 10*3/uL — ABNORMAL HIGH (ref 0.00–0.07)
Basophils Absolute: 0.1 10*3/uL (ref 0.0–0.1)
Basophils Relative: 0 %
Eosinophils Absolute: 0.1 10*3/uL (ref 0.0–0.5)
Eosinophils Relative: 1 %
HCT: 49.5 % (ref 39.0–52.0)
Hemoglobin: 16.5 g/dL (ref 13.0–17.0)
Immature Granulocytes: 1 %
Lymphocytes Relative: 6 %
Lymphs Abs: 0.8 10*3/uL (ref 0.7–4.0)
MCH: 30.1 pg (ref 26.0–34.0)
MCHC: 33.3 g/dL (ref 30.0–36.0)
MCV: 90.3 fL (ref 80.0–100.0)
Monocytes Absolute: 0.6 10*3/uL (ref 0.1–1.0)
Monocytes Relative: 4 %
Neutro Abs: 12.1 10*3/uL — ABNORMAL HIGH (ref 1.7–7.7)
Neutrophils Relative %: 88 %
Platelets: 423 10*3/uL — ABNORMAL HIGH (ref 150–400)
RBC: 5.48 MIL/uL (ref 4.22–5.81)
RDW: 13 % (ref 11.5–15.5)
WBC: 13.7 10*3/uL — ABNORMAL HIGH (ref 4.0–10.5)
nRBC: 0 % (ref 0.0–0.2)

## 2023-01-02 LAB — URINALYSIS, ROUTINE W REFLEX MICROSCOPIC
Bacteria, UA: NONE SEEN
Bilirubin Urine: NEGATIVE
Glucose, UA: >=500 mg/dL — AB
Ketones, ur: 80 mg/dL — AB
Leukocytes,Ua: NEGATIVE
Nitrite: NEGATIVE
Protein, ur: NEGATIVE mg/dL
Specific Gravity, Urine: 1.039 — ABNORMAL HIGH (ref 1.005–1.030)
pH: 5 (ref 5.0–8.0)

## 2023-01-02 LAB — MAGNESIUM: Magnesium: 1.8 mg/dL (ref 1.7–2.4)

## 2023-01-02 LAB — LIPASE, BLOOD: Lipase: 26 U/L (ref 11–51)

## 2023-01-02 LAB — BETA-HYDROXYBUTYRIC ACID: Beta-Hydroxybutyric Acid: 6.3 mmol/L — ABNORMAL HIGH (ref 0.05–0.27)

## 2023-01-02 LAB — PHOSPHORUS: Phosphorus: 3.5 mg/dL (ref 2.5–4.6)

## 2023-01-02 LAB — LACTIC ACID, PLASMA: Lactic Acid, Venous: 1.2 mmol/L (ref 0.5–1.9)

## 2023-01-02 MED ORDER — POTASSIUM CHLORIDE 10 MEQ/100ML IV SOLN
10.0000 meq | INTRAVENOUS | Status: DC
Start: 2023-01-02 — End: 2023-01-02

## 2023-01-02 MED ORDER — LACTATED RINGERS IV SOLN: INTRAVENOUS | Status: DC

## 2023-01-02 MED ORDER — MESALAMINE 1.2 G PO TBEC
4.8000 g | DELAYED_RELEASE_TABLET | Freq: Every day | ORAL | Status: DC
  Administered 2023-01-03 – 2023-01-06 (×4): 4.8 g via ORAL
  Filled 2023-01-02 (×5): qty 4

## 2023-01-02 MED ORDER — PIPERACILLIN-TAZOBACTAM 3.375 G IVPB
3.3750 g | Freq: Three times a day (TID) | INTRAVENOUS | Status: DC
  Administered 2023-01-03 – 2023-01-05 (×7): 3.375 g via INTRAVENOUS
  Filled 2023-01-02 (×7): qty 50

## 2023-01-02 MED ORDER — ONDANSETRON HCL 4 MG/2ML IJ SOLN: 4.0000 mg | Freq: Four times a day (QID) | INTRAMUSCULAR | Status: DC | PRN

## 2023-01-02 MED ORDER — LACTATED RINGERS IV BOLUS
1000.0000 mL | Freq: Once | INTRAVENOUS | Status: AC
  Administered 2023-01-02: 1000 mL via INTRAVENOUS

## 2023-01-02 MED ORDER — ATORVASTATIN CALCIUM 20 MG PO TABS
20.0000 mg | ORAL_TABLET | Freq: Every day | ORAL | Status: DC
  Administered 2023-01-03 – 2023-01-06 (×4): 20 mg via ORAL
  Filled 2023-01-02 (×4): qty 1

## 2023-01-02 MED ORDER — INSULIN REGULAR(HUMAN) IN NACL 100-0.9 UT/100ML-% IV SOLN
INTRAVENOUS | Status: DC
  Administered 2023-01-02: 16 [IU]/h via INTRAVENOUS
  Filled 2023-01-02: qty 100

## 2023-01-02 MED ORDER — IOHEXOL 300 MG/ML  SOLN
80.0000 mL | Freq: Once | INTRAMUSCULAR | Status: AC | PRN
  Administered 2023-01-02: 80 mL via INTRAVENOUS

## 2023-01-02 MED ORDER — LACTATED RINGERS IV SOLN
INTRAVENOUS | Status: DC
Start: 2023-01-02 — End: 2023-01-02

## 2023-01-02 MED ORDER — INSULIN REGULAR(HUMAN) IN NACL 100-0.9 UT/100ML-% IV SOLN: INTRAVENOUS | Status: DC

## 2023-01-02 MED ORDER — LACTATED RINGERS IV BOLUS: 20.0000 mL/kg | Freq: Once | INTRAVENOUS | Status: DC

## 2023-01-02 MED ORDER — ACETAMINOPHEN 650 MG RE SUPP: 650.0000 mg | Freq: Four times a day (QID) | RECTAL | Status: DC | PRN

## 2023-01-02 MED ORDER — PANTOPRAZOLE SODIUM 40 MG IV SOLR
40.0000 mg | INTRAVENOUS | Status: DC
  Administered 2023-01-02 – 2023-01-04 (×3): 40 mg via INTRAVENOUS
  Filled 2023-01-02 (×3): qty 10

## 2023-01-02 MED ORDER — DEXTROSE 50 % IV SOLN
0.0000 mL | INTRAVENOUS | Status: DC | PRN
Start: 2023-01-02 — End: 2023-01-02

## 2023-01-02 MED ORDER — ENOXAPARIN SODIUM 40 MG/0.4ML IJ SOSY: 40.0000 mg | PREFILLED_SYRINGE | INTRAMUSCULAR | Status: DC

## 2023-01-02 MED ORDER — DEXTROSE 50 % IV SOLN: 0.0000 mL | INTRAVENOUS | Status: DC | PRN

## 2023-01-02 MED ORDER — DEXTROSE IN LACTATED RINGERS 5 % IV SOLN: INTRAVENOUS | Status: DC

## 2023-01-02 MED ORDER — DOCUSATE SODIUM 100 MG PO CAPS: 100.0000 mg | ORAL_CAPSULE | Freq: Two times a day (BID) | ORAL | Status: DC | PRN

## 2023-01-02 MED ORDER — ACETAMINOPHEN 325 MG PO TABS
650.0000 mg | ORAL_TABLET | Freq: Four times a day (QID) | ORAL | Status: DC | PRN
  Administered 2023-01-03: 650 mg via ORAL
  Filled 2023-01-02: qty 2

## 2023-01-02 MED ORDER — SODIUM CHLORIDE 0.9 % IV SOLN: INTRAVENOUS | Status: DC

## 2023-01-02 MED ORDER — ONDANSETRON HCL 4 MG PO TABS: 4.0000 mg | ORAL_TABLET | Freq: Four times a day (QID) | ORAL | Status: DC | PRN

## 2023-01-02 MED ORDER — PIPERACILLIN-TAZOBACTAM 3.375 G IVPB 30 MIN
3.3750 g | Freq: Once | INTRAVENOUS | Status: AC
  Administered 2023-01-02: 3.375 g via INTRAVENOUS
  Filled 2023-01-02: qty 50

## 2023-01-02 MED ORDER — DEXTROSE IN LACTATED RINGERS 5 % IV SOLN
INTRAVENOUS | Status: DC
Start: 2023-01-02 — End: 2023-01-02

## 2023-01-02 MED ORDER — TRAMADOL HCL 50 MG PO TABS: 50.0000 mg | ORAL_TABLET | Freq: Four times a day (QID) | ORAL | Status: DC | PRN

## 2023-01-02 MED ORDER — POTASSIUM CHLORIDE 10 MEQ/100ML IV SOLN
10.0000 meq | INTRAVENOUS | Status: AC
  Administered 2023-01-02: 10 meq via INTRAVENOUS
  Filled 2023-01-02: qty 100

## 2023-01-02 MED ORDER — OXYCODONE HCL 5 MG PO TABS
5.0000 mg | ORAL_TABLET | ORAL | Status: DC | PRN
  Administered 2023-01-06: 5 mg via ORAL
  Filled 2023-01-02: qty 1

## 2023-01-02 MED ORDER — IOHEXOL 9 MG/ML PO SOLN
500.0000 mL | ORAL | Status: AC
  Administered 2023-01-02: 1000 mL via ORAL

## 2023-01-02 MED ORDER — TRAZODONE HCL 50 MG PO TABS: 25.0000 mg | ORAL_TABLET | Freq: Every evening | ORAL | Status: DC | PRN

## 2023-01-02 MED ORDER — MAGNESIUM HYDROXIDE 400 MG/5ML PO SUSP: 30.0000 mL | Freq: Every day | ORAL | Status: DC | PRN

## 2023-01-02 MED ORDER — PANTOPRAZOLE SODIUM 40 MG PO TBEC: 40.0000 mg | DELAYED_RELEASE_TABLET | Freq: Every day | ORAL | Status: DC

## 2023-01-02 NOTE — Progress Notes (Signed)
Pharmacy Antibiotic Note  Justin Adkins is a 56 y.o. male admitted on 01/02/2023 with {Indications:3041527}.  Pharmacy has been consulted for *** dosing ***.  Plan: *** *** q***hr per indication & renal fxn. {Assessment:21075}  Pt given Vancomycin *** mg once. Vancomycin *** mg IV Q *** hrs. Goal AUC 400-550. Expected AUC: *** SCr used: ***, Vd used: ***, BMI: ***, TBW *** kg < IBW *** kg  Pharmacy will continue to follow and will adjust abx dosing whenever warranted.  Temp (24hrs), Avg:98 F (36.7 C), Min:97.6 F (36.4 C), Max:98.3 F (36.8 C)   Recent Labs  Lab 01/02/23 1857 01/02/23 2127  WBC 13.7*  --   CREATININE 0.94  --   LATICACIDVEN  --  1.2    Estimated Creatinine Clearance: 81.8 mL/min (by C-G formula based on SCr of 0.94 mg/dL).    Allergies  Allergen Reactions   Naproxen Anaphylaxis   Liraglutide Nausea Only    (Victoza)    Antimicrobials this admission: *** *** >> *** *** *** >> *** *** *** >> ***  Microbiology results: *** BCx: *** *** UCx: ***  *** ***Cx: *** *** Sputum: ***  *** MRSA PCR: *** No lab cx currently ordered or pending at this time.  Thank you for allowing pharmacy to be a part of this patient's care.  Otelia Sergeant, PharmD, [facility] 01/02/2023 11:04 PM

## 2023-01-02 NOTE — H&P (Signed)
[place]   PATIENT NAME: Tymier Arave    MR#:  [id number]  DATE OF BIRTH:  [birth year]  DATE OF ADMISSION:  01/02/2023  PRIMARY CARE PHYSICIAN: [place], [facility]   Patient is coming from: Home  REQUESTING/REFERRING PHYSICIAN: Georga Hacking, MD  CHIEF COMPLAINT:   Chief Complaint  Patient presents with   Abdominal Pain    HISTORY OF PRESENT ILLNESS:  JAYC GOLASZEWSKI is a 57 y.o. male with medical history significant for, type diabetes mellitus, hypertension, dyslipidemia, peripheral vascular disease, ulcerative colitis and GERD who underwent recent laparoscopic cholecystectomy on 4/9 by Dr. Everlene Farrier and was seen today in follow-up.  He has been having residual abdominal pain and given concern about increased intraperitoneal air and abdominal pelvic CT scan was ordered.  The patient admits to nausea without vomiting for the last few days.  Denies any diarrhea.  No fever or chills.  No chest pain or dyspnea or cough.  No dysuria, oliguria or hematuria or flank pain.  His laparoscopic wounds have been healing well.  ED Course: When the patient came to the ER, BP was 190/100 with otherwise normal vital signs.  Labs revealed a sodium 129 with chloride of 94 with a CO2 of 11 and blood glucose of 366 with BUN of 23 and anion gap of 24 with alk phos 129 and total bili 2.1.  Serum lipase was 26.  Lactic acid was 1.2.  CBC showed leukocytosis 13.7 with neutrophilia and thrombocytosis of 423.  Per hydroxybutyrate was 6.3 and UA came back with more than 500 glucose and 80 ketones with specific gravity 1039.  Imaging: Abdominal pelvic CT scan revealed the following: 1. Moderate amount of scattered free intraperitoneal gas primarily along the liver edge and in the upper abdomen. This is somewhat more gas than I would typically expect on postoperative day 8 status post CO2 laparoscopy (conventionally, open surgical free air can last up to 10 days postoperatively but usually CO2  following laparoscopy is absorbed more quickly). This raises the possibility of a perforated viscus, but I do not see a specific site of perforation, and no spillage of the oral contrast into the abdominal cavity is observed. 2. Diffuse colon wall thickening along with some mild wall thickening of the terminal ileum. Appearance favors colitis. Some degree of colitis was also shown on the prior CT scan from 07/16/2022. I do not see any pneumatosis, portal venous gas, or abnormal narrowing/filling defect in the superior mesenteric artery or its branches to indicate a specific vascular/ischemic cause. The IMA and celiac trunk likewise appear patent. 3. 110 cc fluid collection tracking below the right hepatic lobe with enhancing margins, cannot exclude abscess or biloma. 4. Small type 1 hiatal hernia. 5. Subsegmental atelectasis in the posterior basal segment right lower lobe. 6. Mild aortoiliac atherosclerosis.  Dr. Aleen Campi was notified and evaluated the patient.  The patient was given 2 L bolus of IV lactated Ringer followed by 125 mill per hour as well as IV Zosyn.  He was started on IV insulin drip  per Endo [facility].  He will be admitted to a stepdown unit bed for further evaluation and management. PAST MEDICAL HISTORY:   Past Medical History:  Diagnosis Date   Atherosclerosis of native arteries of extremity with intermittent claudication [facility])    ED (erectile dysfunction)    Elevated liver enzymes    GERD (gastroesophageal reflux disease)    History of echocardiogram    a. 07/2020 Echo:  EF 60-65%, no rwma, mild LVH. Nl RV fxn. Mildly dil LA.   History of tobacco abuse    Hyperlipidemia    Hypertension    Peripheral vascular disease (HCC)    a. 05/2019 s/p PTA R popliteal/AT, mech thrombectomy to R popliteal/tibioperoneal trunk--> plavix & eliquis 2.5 bid since; 09/2019 LE u/s: near nl study.   Popliteal artery occlusion, right (HCC)    Type 2 diabetes mellitus (HCC)    Ulcerative  colitis (HCC)     PAST SURGICAL HISTORY:   Past Surgical History:  Procedure Laterality Date   COLONOSCOPY     COLONOSCOPY N/A 09/24/2020   Procedure: COLONOSCOPY;  Surgeon: Regis Bill, MD;  Location: [facility] ENDOSCOPY;  Service: Endoscopy;  Laterality: N/A;   COLONOSCOPY WITH PROPOFOL N/A 11/07/2022   Procedure: COLONOSCOPY WITH PROPOFOL;  Surgeon: Regis Bill, MD;  Location: [facility] ENDOSCOPY;  Service: Endoscopy;  Laterality: N/A;   ESOPHAGOGASTRODUODENOSCOPY (EGD) WITH PROPOFOL N/A 02/28/2020   Procedure: ESOPHAGOGASTRODUODENOSCOPY (EGD) WITH PROPOFOL;  Surgeon: Regis Bill, MD;  Location: [facility] ENDOSCOPY;  Service: Endoscopy;  Laterality: N/A;   FOOT SURGERY Right    HERNIA REPAIR     inguinal hernia   LOWER EXTREMITY ANGIOGRAPHY Right 05/25/2019   Procedure: LOWER EXTREMITY ANGIOGRAPHY;  Surgeon: Renford Dills, MD;  Location: [facility] INVASIVE CV LAB;  Service: Cardiovascular;  Laterality: Right;   LOWER EXTREMITY ANGIOGRAPHY Right 07/02/2021   Procedure: LOWER EXTREMITY ANGIOGRAPHY;  Surgeon: Renford Dills, MD;  Location: [facility] INVASIVE CV LAB;  Service: Cardiovascular;  Laterality: Right;   TONSILLECTOMY     VASCULAR SURGERY      SOCIAL HISTORY:   Social History   Tobacco Use   Smoking status: Former    Packs/day: 1.00    Years: 35.00    Additional pack years: 0.00    Total pack years: 35.00    Types: Cigarettes    Quit date: 2021    Years since quitting: 3.3    Passive exposure: Past   Smokeless tobacco: Never  Substance Use Topics   Alcohol use: Yes    Comment: rarely    FAMILY HISTORY:   Family History  Problem Relation Age of Onset   Cancer Father    Heart attack Brother        MI @ 34    DRUG ALLERGIES:   Allergies  Allergen Reactions   Naproxen Anaphylaxis   Liraglutide Nausea Only    ([place])    REVIEW OF SYSTEMS:   ROS As per history of present illness. All pertinent systems were reviewed above. Constitutional,  HEENT, cardiovascular, respiratory, GI, GU, musculoskeletal, neuro, psychiatric, endocrine, integumentary and hematologic systems were reviewed and are otherwise negative/unremarkable except for positive findings mentioned above in the HPI.   MEDICATIONS AT HOME:   Prior to Admission medications   Medication Sig Start Date End Date Taking? Authorizing Provider  atorvastatin (LIPITOR) 20 MG tablet Take 1 tablet (20 mg total) by mouth daily. Patient taking differently: Take 20 mg by mouth every morning. 03/11/21 06/28/26  Debbe Odea, MD  clopidogrel (PLAVIX) 75 MG tablet Take 1 tablet (75 mg total) by mouth daily. Patient taking differently: Take 75 mg by mouth every morning. 09/03/22   Georgiana Spinner, NP  docusate sodium (COLACE) 100 MG capsule Take 100 mg by mouth 2 (two) times daily as needed for mild constipation.    [provider]  ergocalciferol (VITAMIN D2) 1.25 MG (50000 UT) capsule Take 50,000 Units by mouth every Saturday.  [provider]  FARXIGA 10 MG TABS tablet Take 10 mg by mouth daily. 07/03/20   [provider]  HYDROcodone-acetaminophen (NORCO/VICODIN) 5-325 MG tablet Take 1-2 tablets by mouth every 6 (six) hours as needed for moderate pain. 12/25/22   Pabon, Diego F, MD  ketoconazole (NIZORAL) 2 % shampoo Apply 1 Application topically 3 (three) times a week. 01/19/22   [provider]  mesalamine (LIALDA) 1.2 g EC tablet Take 4.8 g by mouth daily with breakfast. 10/01/20   [provider]  NOVOLIN N 100 UNIT/ML injection Inject 40 Units into the skin 2 (two) times daily before a meal. 05/27/19   [provider]  ondansetron (ZOFRAN-ODT) 4 MG disintegrating tablet Take by mouth. 08/28/21   [provider]  oxyCODONE (ROXICODONE) 5 MG immediate release tablet Take 1 tablet (5 mg total) by mouth every 4 (four) hours as needed for severe pain. 12/31/22   Pabon, Diego F, MD  OZEMPIC, 1 MG/DOSE, 4 MG/3ML SOPN Inject 1 mg  into the skin every 7 (seven) days.    [provider]  pantoprazole (PROTONIX) 40 MG tablet Take 1 tablet (40 mg total) by mouth daily. Patient taking differently: Take 40 mg by mouth as needed. 12/04/22 12/04/23  Jene Every, MD  rivaroxaban (XARELTO) 2.5 MG TABS tablet Take 1 tablet (2.5 mg total) by mouth 2 (two) times daily. 09/03/22   Georgiana Spinner, NP  Semaglutide, 1 MG/DOSE, (OZEMPIC, 1 MG/DOSE,) 2 MG/1.5ML SOPN Inject 1 mg into the skin once a week. On Saturdays    [provider]  traMADol (ULTRAM) 50 MG tablet Take 1 tablet (50 mg total) by mouth every 6 (six) hours as needed. 12/04/22 12/04/23  Jene Every, MD      VITAL SIGNS:  Blood pressure (!) 179/89, pulse 94, temperature 98.1 F (36.7 C), temperature source Oral, resp. rate 19, SpO2 98 %.  PHYSICAL EXAMINATION:  Physical Exam  GENERAL:  57 y.o.-year-old patient lying in the bed with no acute distress.  EYES: Pupils equal, round, reactive to light and accommodation. No scleral icterus. Extraocular muscles intact.  HEENT: Head atraumatic, normocephalic. Oropharynx and nasopharynx clear.  NECK:  Supple, no jugular venous distention. No thyroid enlargement, no tenderness.  LUNGS: Normal breath sounds bilaterally, no wheezing, rales,rhonchi or crepitation. No use of accessory muscles of respiration.  CARDIOVASCULAR: Regular rate and rhythm, S1, S2 normal. No murmurs, rubs, or gallops.  ABDOMEN: Soft, nondistended, nontender. Bowel sounds present. No organomegaly or mass.  EXTREMITIES: No pedal edema, cyanosis, or clubbing.  NEUROLOGIC: Cranial nerves II through XII are intact. Muscle strength 5/5 in all extremities. Sensation intact. Gait not checked.  PSYCHIATRIC: The patient is alert and oriented x 3.  Normal affect and good eye contact. SKIN: No obvious rash, lesion, or ulcer.   LABORATORY PANEL:   CBC Recent Labs  Lab 01/02/23 1857  WBC 13.7*  HGB 16.5  HCT 49.5  PLT 423*    ------------------------------------------------------------------------------------------------------------------  Chemistries  Recent Labs  Lab 01/02/23 1857  NA 129*  K 4.1  CL 94*  CO2 11*  GLUCOSE 366*  BUN 23*  CREATININE 0.94  CALCIUM 9.4  MG 1.8  AST 11*  ALT 12  ALKPHOS 129*  BILITOT 2.1*   ------------------------------------------------------------------------------------------------------------------  Cardiac Enzymes No results for input(s): "TROPONINI" in the last 168 hours. ------------------------------------------------------------------------------------------------------------------  RADIOLOGY:  CT Abdomen Pelvis W Contrast  Addendum Date: 01/02/2023   ADDENDUM REPORT: 01/02/2023 17:20 ADDENDUM: The original report was by Dr. Gaylyn Rong.  The following addendum is by Dr. Gaylyn Rong: Radiology personnel were unable to reach Dr. Everlene Farrier by telephone, possibly related to being after hours. We were directed to Dr. Aleen Campi who is on-call, but was in surgery. I discussed the case briefly with Dr. Aleen Campi from the operating room, and he directed [place] to have the patient proceed to the emergency room. Accordingly, I called Mr. Woitas at 5:05 p.m. and after discussing some of the findings on the CT scan he agreed to proceed to the [facility] emergency room for further assessment. I telephoned the emergency department and spoke to attending physician Dr. Derrill Kay. We discussed the case in brief so that he would be oriented to the patient. We also left a voicemail on Dr. Hurman Horn phone indicating this series of events along with the patient's contact information. Electronically Signed   By: Gaylyn Rong M.D.   On: 01/02/2023 17:20   Result Date: 01/02/2023 CLINICAL DATA:  Postoperative abdominal pain. Cholecystectomy eight days ago. Weight loss. Reduced appetite. EXAM: CT ABDOMEN AND PELVIS WITH CONTRAST TECHNIQUE: Multidetector CT imaging of  the abdomen and pelvis was performed using the standard protocol following bolus administration of intravenous contrast. RADIATION DOSE REDUCTION: This exam was performed according to the departmental dose-optimization program which includes automated exposure control, adjustment of the mA and/or kV according to patient size and/or use of iterative reconstruction technique. CONTRAST:  80mL OMNIPAQUE IOHEXOL 300 MG/ML  SOLN COMPARISON:  07/16/2022 and ultrasound 12/04/2022 FINDINGS: Lower chest: Subsegmental atelectasis in the posterior basal segment right lower lobe. Hepatobiliary: Trace fluid along the gallbladder fossa as can commonly be encountered at this stage status post cholecystectomy. However, there is additional fluid tracking below the right hepatic lobe with enhancing margins measuring up to about 11.3 by 4.5 by 4.0 cm (volume = 110 cm^3), cannot exclude abscess or biloma. There is no gas in this collection. Mild focal steatosis in the liver adjacent to the falciform ligament. No biliary dilatation. Pancreas: Unremarkable Spleen: Unremarkable Adrenals/Urinary Tract: Unremarkable Stomach/Bowel: Small type 1 hiatal hernia. Diffuse colon wall thickening along with some mild wall thickening of the terminal ileum. The rectal wall does not appear thickened. Vascular/Lymphatic: Primarily soft plaque in the abdominal aorta and iliac vessels without substantial narrowing or specific abnormality in the proximal celiac trunk or SMA. I do not see any portal venous gas. Reproductive: Unremarkable Other: There is a moderate amount of scattered free intraperitoneal gas primarily along the liver edge and in the upper abdomen. This is somewhat more gas than I would typically expect on postoperative day 8 status post CO2 laparoscopy. This raises the possibility of a perforated viscus, but I do not see a specific site of perforation, and no spillage of the oral contrast into the abdominal cavity is observed, in particular  I see no definite spilled contrast in the vicinity of the descending duodenum closest to the residual fluid along the gallbladder fossa. Musculoskeletal: Unremarkable IMPRESSION: 1. Moderate amount of scattered free intraperitoneal gas primarily along the liver edge and in the upper abdomen. This is somewhat more gas than I would typically expect on postoperative day 8 status post CO2 laparoscopy (conventionally, open surgical free air can last up to 10 days postoperatively but usually CO2 following laparoscopy is absorbed more quickly). This raises the possibility of a perforated viscus, but I do not see a specific site of perforation, and no spillage of the oral contrast into the abdominal cavity is observed. 2. Diffuse colon wall thickening along with  some mild wall thickening of the terminal ileum. Appearance favors colitis. Some degree of colitis was also shown on the prior CT scan from 07/16/2022. I do not see any pneumatosis, portal venous gas, or abnormal narrowing/filling defect in the superior mesenteric artery or its branches to indicate a specific vascular/ischemic cause. The IMA and celiac trunk likewise appear patent. 3. 110 cc fluid collection tracking below the right hepatic lobe with enhancing margins, cannot exclude abscess or biloma. 4. Small type 1 hiatal hernia. 5. Subsegmental atelectasis in the posterior basal segment right lower lobe. 6. Mild aortoiliac atherosclerosis. Aortic Atherosclerosis (ICD10-I70.0). Radiology assistant personnel have been notified to put me in telephone contact with the referring physician or the referring physician's clinical representative in order to discuss these findings. Once this communication is established I will issue an addendum to this report for documentation purposes. Electronically Signed: By: Gaylyn Rong M.D. On: 01/02/2023 16:34      IMPRESSION AND PLAN:  Assessment and Plan: * DKA, type 2 (HCC) - The patient will be admitted to a  stepdown bed. - We will continue her on IV insulin drip per [facility] DKA protocol. - The patient will be aggressively hydrated with IV lactated ringer. - Will follow serial BMPs.   Abnormal abdominal CT scan - This is concerning for right upper abdominal abscess versus biloma. - Dr. Aleen Campi was consulted and evaluated the patient. - HIDA scan will be obtained in a.m. - He highly doubt that there is a perforation causing the free air noted on CT.  He believes this is just residual but since the patient does have a good size fluid collection in the RUQ that could be an abscess or biloma he ordered a HIDA scan for tomorrow morning to evaluate. - He recommended adding IV Zosyn and holding off anticoagulation and Lovenox for potential procedure whether IR drainage or possibly ERCP. - Pain management will be provided.  Hyponatremia - This is likely pseudohyponatremia from DKA. - Will follow sodium level with hydration.  Dyslipidemia - We will continue PPI therapy.  GERD without esophagitis - PPI therapy will be continued.  Ulcerative colitis (HCC) - We will continue mesalamine.   DVT prophylaxis: SCDs Advanced Care Planning:  Code Status: full code.  Family Communication:  The plan of care was discussed in details with the patient (and family). I answered all questions. The patient agreed to proceed with the above mentioned plan. Further management will depend upon hospital course. Disposition Plan: Back to previous home environment Consults called: General surgery. All the records are reviewed and case discussed with ED provider.  Status is: Inpatient    At the time of the admission, it appears that the appropriate admission status for this patient is inpatient.  This is judged to be reasonable and necessary in order to provide the required intensity of service to ensure the patient's safety given the presenting symptoms, physical exam findings and initial radiographic and  laboratory data in the context of comorbid conditions.  The patient requires inpatient status due to high intensity of service, high risk of further deterioration and high frequency of surveillance required.  I certify that at the time of admission, it is my clinical judgment that the patient will require inpatient hospital care extending more than 2 midnights.                            Dispo: The patient is from: Home  Anticipated d/c is to: Home              Patient currently is not medically stable to d/c.              Difficult to place patient: No Authorized and performed by: Valente David, MD Total critical care time: Approximately 55    minutes. Due to a high probability of clinically significant, life-threatening deterioration, the patient required my highest level of preparedness to intervene emergently and I personally spent this critical care time directly and personally managing the patient.  This critical care time included obtaining a history, examining the patient, pulse oximetry, ordering and review of studies, arranging urgent treatment with development of management plan, evaluation of patient's response to treatment, frequent reassessment, and discussions with other providers. This critical care time was performed to assess and manage the high probability of imminent, life-threatening deterioration that could result in multiorgan failure.  It was exclusive of separately billable procedures and treating other patients and teaching time.     Hannah Beat M.D on 01/03/2023 at 12:02 AM  [facility]   From 7 PM-7 AM, contact night-coverage [email or web address]  CC: Primary care physician; [facility]

## 2023-01-02 NOTE — Consult Note (Signed)
Date of Consultation:  01/02/2023  Requesting Physician:  Georga Hacking, MD  Reason for Consultation:  Pneumoperitoneum and RUQ fluid collection  History of Present Illness: Justin Adkins is a 57 y.o. male presenting for evaluation of pneumoperitoneum and RUQ fluid collection noted on outpatient CT scan done today.  The patient is s/p robotic assisted cholecystectomy with Dr. Everlene Farrier on 12/25/22.  Justin his operative note, the gallbladder was very inflamed and required to open the gallbladder itself in order to delineate the anatomy better leading to the cystic duct.  A drain was left in place, which was removed on 12/31/22.  The patient reports that since surgery he has not felt fully well.  He reports decreased appetite, feeling tired but cannot sleep.  Reports having pain with the drain removal, but this improved shortly after.  Due to his symptoms, an outpatient CT scan was ordered.  This was done today and Dr. Ova Freshwater called with results, concerning for residual pneumoperitoneum and a fluid collection in the RUQ.  Patient was instructed to come to the emergency room for further evaluation.  Overall the patient reports that he has had some pressure sensation in the right side of the abdomen but denies any worsening pain.  Has not had much appetite but denies any nausea or vomiting.  He has not been taking his insulin as instructed and is on presentation emergency room he is in DKA.  Past Medical History: Past Medical History:  Diagnosis Date   Atherosclerosis of native arteries of extremity with intermittent claudication [facility])    ED (erectile dysfunction)    Elevated liver enzymes    GERD (gastroesophageal reflux disease)    History of echocardiogram    a. 07/2020 Echo: EF 60-65%, no rwma, mild LVH. Nl RV fxn. Mildly dil LA.   History of tobacco abuse    Hyperlipidemia    Hypertension    Peripheral vascular disease (HCC)    a. 05/2019 s/p PTA R popliteal/AT, mech thrombectomy to R  popliteal/tibioperoneal trunk--> plavix & eliquis 2.5 bid since; 09/2019 LE u/s: near nl study.   Popliteal artery occlusion, right (HCC)    Type 2 diabetes mellitus (HCC)    Ulcerative colitis (HCC)      Past Surgical History: Past Surgical History:  Procedure Laterality Date   COLONOSCOPY     COLONOSCOPY N/A 09/24/2020   Procedure: COLONOSCOPY;  Surgeon: Regis Bill, MD;  Location: [facility] ENDOSCOPY;  Service: Endoscopy;  Laterality: N/A;   COLONOSCOPY WITH PROPOFOL N/A 11/07/2022   Procedure: COLONOSCOPY WITH PROPOFOL;  Surgeon: Regis Bill, MD;  Location: [facility] ENDOSCOPY;  Service: Endoscopy;  Laterality: N/A;   ESOPHAGOGASTRODUODENOSCOPY (EGD) WITH PROPOFOL N/A 02/28/2020   Procedure: ESOPHAGOGASTRODUODENOSCOPY (EGD) WITH PROPOFOL;  Surgeon: Regis Bill, MD;  Location: [facility] ENDOSCOPY;  Service: Endoscopy;  Laterality: N/A;   FOOT SURGERY Right    HERNIA REPAIR     inguinal hernia   LOWER EXTREMITY ANGIOGRAPHY Right 05/25/2019   Procedure: LOWER EXTREMITY ANGIOGRAPHY;  Surgeon: Renford Dills, MD;  Location: [facility] INVASIVE CV LAB;  Service: Cardiovascular;  Laterality: Right;   LOWER EXTREMITY ANGIOGRAPHY Right 07/02/2021   Procedure: LOWER EXTREMITY ANGIOGRAPHY;  Surgeon: Renford Dills, MD;  Location: [facility] INVASIVE CV LAB;  Service: Cardiovascular;  Laterality: Right;   TONSILLECTOMY     VASCULAR SURGERY      Home Medications: Prior to Admission medications   Medication Sig Start Date End Date Taking? Authorizing Provider  atorvastatin (LIPITOR) 20 MG tablet Take  1 tablet (20 mg total) by mouth daily. Patient taking differently: Take 20 mg by mouth every morning. 03/11/21 06/28/26 Yes Agbor-Etang, Arlys John, MD  clopidogrel (PLAVIX) 75 MG tablet Take 1 tablet (75 mg total) by mouth daily. Patient taking differently: Take 75 mg by mouth every morning. 09/03/22  Yes Georgiana Spinner, NP  docusate sodium (COLACE) 100 MG capsule Take 100 mg by mouth 2 (two)  times daily as needed for mild constipation.   Yes [provider]  ergocalciferol (VITAMIN D2) 1.25 MG (50000 UT) capsule Take 50,000 Units by mouth every Saturday.   Yes [provider]  FARXIGA 10 MG TABS tablet Take 10 mg by mouth daily. 07/03/20  Yes [provider]  HYDROcodone-acetaminophen (NORCO/VICODIN) 5-325 MG tablet Take 1-2 tablets by mouth every 6 (six) hours as needed for moderate pain. 12/25/22  Yes Pabon, Diego F, MD  ketoconazole (NIZORAL) 2 % shampoo Apply 1 Application topically 3 (three) times a week. 01/19/22  Yes [provider]  mesalamine (LIALDA) 1.2 g EC tablet Take 4.8 g by mouth daily with breakfast. 10/01/20  Yes [provider]  NOVOLIN N 100 UNIT/ML injection Inject 40 Units into the skin 2 (two) times daily before a meal. 05/27/19  Yes [provider]  oxyCODONE (ROXICODONE) 5 MG immediate release tablet Take 1 tablet (5 mg total) by mouth every 4 (four) hours as needed for severe pain. 12/31/22  Yes Pabon, Diego F, MD  OZEMPIC, 1 MG/DOSE, 4 MG/3ML SOPN Inject 1 mg into the skin every 7 (seven) days.   Yes [provider]  pantoprazole (PROTONIX) 40 MG tablet Take 1 tablet (40 mg total) by mouth daily. Patient taking differently: Take 40 mg by mouth as needed. 12/04/22 12/04/23 Yes Jene Every, MD  rivaroxaban (XARELTO) 2.5 MG TABS tablet Take 1 tablet (2.5 mg total) by mouth 2 (two) times daily. 09/03/22  Yes Georgiana Spinner, NP  traMADol (ULTRAM) 50 MG tablet Take 1 tablet (50 mg total) by mouth every 6 (six) hours as needed. 12/04/22 12/04/23 Yes Jene Every, MD  ondansetron (ZOFRAN-ODT) 4 MG disintegrating tablet Take by mouth. Patient not taking: Reported on 01/02/2023 08/28/21   [provider]  Semaglutide, 1 MG/DOSE, (OZEMPIC, 1 MG/DOSE,) 2 MG/1.5ML SOPN Inject 1 mg into the skin once a week. On Saturdays    [provider]    Allergies: Allergies  Allergen Reactions   Naproxen  Anaphylaxis   Liraglutide Nausea Only    (Victoza)    Social History:  reports that he quit smoking about 3 years ago. His smoking use included cigarettes. He has a 35.00 pack-year smoking history. He has been exposed to tobacco smoke. He has never used smokeless tobacco. He reports current alcohol use. He reports that he does not use drugs.   Family History: Family History  Problem Relation Age of Onset   Cancer Father    Heart attack Brother        MI @ 57    Review of Systems: Review of Systems  Constitutional:  Positive for malaise/fatigue. Negative for chills and fever.  HENT:  Negative for hearing loss.   Respiratory:  Negative for shortness of breath.   Cardiovascular:  Negative for chest pain.  Gastrointestinal:  Positive for abdominal pain. Negative for nausea and vomiting.  Genitourinary:  Negative for dysuria.  Musculoskeletal:  Negative for myalgias.  Skin:  Negative for rash.  Neurological:  Negative for dizziness.  Psychiatric/Behavioral:  Negative for depression.  Physical Exam BP (!) 119/100 (BP Location: Left Arm)   Pulse 90   Temp 98.3 F (36.8 C) (Oral)   Resp 20   SpO2 98%  CONSTITUTIONAL: No acute distress HEENT:  Normocephalic, atraumatic, extraocular motion intact. NECK: Trachea is midline, and there is no jugular venous distension. RESPIRATORY:  Normal respiratory effort without pathologic use of accessory muscles. CARDIOVASCULAR: Regular rhythm and rate. GI: The abdomen is soft, nondistended, with some discomfort to palpation in the right upper quadrant and epigastric area.  No peritonitis, nontoxic.  Incisions are healing well and are clean, dry, intact.  MUSCULOSKELETAL:  Normal muscle strength and tone in all four extremities.  No peripheral edema or cyanosis. SKIN: Skin turgor is normal. There are no pathologic skin lesions.  NEUROLOGIC:  Motor and sensation is grossly normal.  Cranial nerves are grossly intact. PSYCH:  Alert and oriented  to person, place and time. Affect is normal.  Laboratory Analysis: Results for orders placed or performed during the hospital encounter of 01/02/23 (from the past 24 hour(s))  CBC with Differential     Status: Abnormal   Collection Time: 01/02/23  6:57 PM  Result Value Ref Range   WBC 13.7 (H) 4.0 - 10.5 K/uL   RBC 5.48 4.22 - 5.81 MIL/uL   Hemoglobin 16.5 13.0 - 17.0 g/dL   HCT 16.1 09.6 - 04.5 %   MCV 90.3 80.0 - 100.0 fL   MCH 30.1 26.0 - 34.0 pg   MCHC 33.3 30.0 - 36.0 g/dL   RDW 40.9 81.1 - 91.4 %   Platelets 423 (H) 150 - 400 K/uL   nRBC 0.0 0.0 - 0.2 %   Neutrophils Relative % 88 %   Neutro Abs 12.1 (H) 1.7 - 7.7 K/uL   Lymphocytes Relative 6 %   Lymphs Abs 0.8 0.7 - 4.0 K/uL   Monocytes Relative 4 %   Monocytes Absolute 0.6 0.1 - 1.0 K/uL   Eosinophils Relative 1 %   Eosinophils Absolute 0.1 0.0 - 0.5 K/uL   Basophils Relative 0 %   Basophils Absolute 0.1 0.0 - 0.1 K/uL   Immature Granulocytes 1 %   Abs Immature Granulocytes 0.08 (H) 0.00 - 0.07 K/uL  Comprehensive metabolic panel     Status: Abnormal   Collection Time: 01/02/23  6:57 PM  Result Value Ref Range   Sodium 129 (L) 135 - 145 mmol/L   Potassium 4.1 3.5 - 5.1 mmol/L   Chloride 94 (L) 98 - 111 mmol/L   CO2 11 (L) 22 - 32 mmol/L   Glucose, Bld 366 (H) 70 - 99 mg/dL   BUN 23 (H) 6 - 20 mg/dL   Creatinine, Ser 7.82 0.61 - 1.24 mg/dL   Calcium 9.4 8.9 - 95.6 mg/dL   Total Protein 8.1 6.5 - 8.1 g/dL   Albumin 3.6 3.5 - 5.0 g/dL   AST 11 (L) 15 - 41 U/L   ALT 12 0 - 44 U/L   Alkaline Phosphatase 129 (H) 38 - 126 U/L   Total Bilirubin 2.1 (H) 0.3 - 1.2 mg/dL   GFR, Estimated >21 >30 mL/min   Anion gap 24 (H) 5 - 15  Magnesium     Status: None   Collection Time: 01/02/23  6:57 PM  Result Value Ref Range   Magnesium 1.8 1.7 - 2.4 mg/dL  Phosphorus     Status: None   Collection Time: 01/02/23  6:57 PM  Result Value Ref Range   Phosphorus 3.5 2.5 - 4.6 mg/dL  Lactic acid, plasma     Status: None    Collection Time: 01/02/23  9:27 PM  Result Value Ref Range   Lactic Acid, Venous 1.2 0.5 - 1.9 mmol/L  Lipase, blood     Status: None   Collection Time: 01/02/23  9:27 PM  Result Value Ref Range   Lipase 26 11 - 51 U/L  Urinalysis, Routine w reflex microscopic -Urine, Clean Catch     Status: Abnormal   Collection Time: 01/02/23  9:27 PM  Result Value Ref Range   Color, Urine YELLOW (A) YELLOW   APPearance CLEAR (A) CLEAR   Specific Gravity, Urine 1.039 (H) 1.005 - 1.030   pH 5.0 5.0 - 8.0   Glucose, UA >=500 (A) NEGATIVE mg/dL   Hgb urine dipstick SMALL (A) NEGATIVE   Bilirubin Urine NEGATIVE NEGATIVE   Ketones, ur 80 (A) NEGATIVE mg/dL   Protein, ur NEGATIVE NEGATIVE mg/dL   Nitrite NEGATIVE NEGATIVE   Leukocytes,Ua NEGATIVE NEGATIVE   RBC / HPF 0-5 0 - 5 RBC/hpf   WBC, UA 0-5 0 - 5 WBC/hpf   Bacteria, UA NONE SEEN NONE SEEN   Squamous Epithelial / HPF 0-5 0 - 5 /HPF   Mucus PRESENT     Imaging: CT Abdomen Pelvis W Contrast  Addendum Date: 01/02/2023   ADDENDUM REPORT: 01/02/2023 17:20 ADDENDUM: The original report was by Dr. Gaylyn Rong. The following addendum is by Dr. Gaylyn Rong: Radiology personnel were unable to reach Dr. Everlene Farrier by telephone, possibly related to being after hours. We were directed to Dr. Aleen Campi who is on-call, but was in surgery. I discussed the case briefly with Dr. Aleen Campi from the operating room, and he directed [place] to have the patient proceed to the emergency room. Accordingly, I called Justin Adkins at 5:05 p.m. and after discussing some of the findings on the CT scan he agreed to proceed to the [facility] emergency room for further assessment. I telephoned the emergency department and spoke to attending physician Dr. Derrill Kay. We discussed the case in brief so that he would be oriented to the patient. We also left a voicemail on Dr. Hurman Horn phone indicating this series of events along with the patient's contact information.  Electronically Signed   By: Gaylyn Rong M.D.   On: 01/02/2023 17:20   Result Date: 01/02/2023 CLINICAL DATA:  Postoperative abdominal pain. Cholecystectomy eight days ago. Weight loss. Reduced appetite. EXAM: CT ABDOMEN AND PELVIS WITH CONTRAST TECHNIQUE: Multidetector CT imaging of the abdomen and pelvis was performed using the standard protocol following bolus administration of intravenous contrast. RADIATION DOSE REDUCTION: This exam was performed according to the departmental dose-optimization program which includes automated exposure control, adjustment of the mA and/or kV according to patient size and/or use of iterative reconstruction technique. CONTRAST:  80mL OMNIPAQUE IOHEXOL 300 MG/ML  SOLN COMPARISON:  07/16/2022 and ultrasound 12/04/2022 FINDINGS: Lower chest: Subsegmental atelectasis in the posterior basal segment right lower lobe. Hepatobiliary: Trace fluid along the gallbladder fossa as can commonly be encountered at this stage status post cholecystectomy. However, there is additional fluid tracking below the right hepatic lobe with enhancing margins measuring up to about 11.3 by 4.5 by 4.0 cm (volume = 110 cm^3), cannot exclude abscess or biloma. There is no gas in this collection. Mild focal steatosis in the liver adjacent to the falciform ligament. No biliary dilatation. Pancreas: Unremarkable Spleen: Unremarkable Adrenals/Urinary Tract: Unremarkable Stomach/Bowel: Small type 1 hiatal hernia. Diffuse colon wall thickening along with some mild wall  thickening of the terminal ileum. The rectal wall does not appear thickened. Vascular/Lymphatic: Primarily soft plaque in the abdominal aorta and iliac vessels without substantial narrowing or specific abnormality in the proximal celiac trunk or SMA. I do not see any portal venous gas. Reproductive: Unremarkable Other: There is a moderate amount of scattered free intraperitoneal gas primarily along the liver edge and in the upper abdomen. This  is somewhat more gas than I would typically expect on postoperative day 8 status post CO2 laparoscopy. This raises the possibility of a perforated viscus, but I do not see a specific site of perforation, and no spillage of the oral contrast into the abdominal cavity is observed, in particular I see no definite spilled contrast in the vicinity of the descending duodenum closest to the residual fluid along the gallbladder fossa. Musculoskeletal: Unremarkable IMPRESSION: 1. Moderate amount of scattered free intraperitoneal gas primarily along the liver edge and in the upper abdomen. This is somewhat more gas than I would typically expect on postoperative day 8 status post CO2 laparoscopy (conventionally, open surgical free air can last up to 10 days postoperatively but usually CO2 following laparoscopy is absorbed more quickly). This raises the possibility of a perforated viscus, but I do not see a specific site of perforation, and no spillage of the oral contrast into the abdominal cavity is observed. 2. Diffuse colon wall thickening along with some mild wall thickening of the terminal ileum. Appearance favors colitis. Some degree of colitis was also shown on the prior CT scan from 07/16/2022. I do not see any pneumatosis, portal venous gas, or abnormal narrowing/filling defect in the superior mesenteric artery or its branches to indicate a specific vascular/ischemic cause. The IMA and celiac trunk likewise appear patent. 3. 110 cc fluid collection tracking below the right hepatic lobe with enhancing margins, cannot exclude abscess or biloma. 4. Small type 1 hiatal hernia. 5. Subsegmental atelectasis in the posterior basal segment right lower lobe. 6. Mild aortoiliac atherosclerosis. Aortic Atherosclerosis (ICD10-I70.0). Radiology assistant personnel have been notified to put me in telephone contact with the referring physician or the referring physician's clinical representative in order to discuss these findings.  Once this communication is established I will issue an addendum to this report for documentation purposes. Electronically Signed: By: Gaylyn Rong M.D. On: 01/02/2023 16:34    Assessment and Plan: This is a 57 y.o. male status post robotic assisted cholecystectomy with a right upper quadrant fluid collection as well as pneumoperitoneum.  - I personally reviewed the CT scan images.  I agree there is a fluid collection in the right upper quadrant underneath the right side of the liver.  This may be residual fluid after the drain was removed but it is rim-enhancing suspicious for potential abscess.  This could also be a biloma given his recent cholecystectomy and difficulty with dissection.  Although there is residual pneumoperitoneum, the patient's abdomen is overall quite benign and there is no indication of any peritonitis.  At this point, discussed with the patient that I do not think he will need any emergent surgical intervention.  However we do need to evaluate this fluid collection. - With his DKA, he is getting admitted to the hospitalist team for further management of this.  From the abdominal standpoint, discussed with him that we will keep him n.p.o., start IV antibiotics, and would obtain a HIDA scan in the morning to evaluate for potential bile leak.  Given the fluid collection itself the he also would need interventional  radiology for potential drain placement.  If there is a bile leak, he will also need GI consult for ERCP.  Will keep a close eye on his abdominal exam and monitor his vitals.  If there is any worsening issues, we may end up needing to do a diagnostic laparoscopy.  However this would be less likely in the differential. - Patient is in agreement with this plan.  All of his questions have been answered.  I spent 60 minutes dedicated to the care of this patient on the date of this encounter to include pre-visit review of records, face-to-face time with the patient discussing  diagnosis and management, and any post-visit coordination of care.   Howie Ill, MD [facility] Pg:  [phone]

## 2023-01-02 NOTE — Assessment & Plan Note (Signed)
-   We will continue mesalamine. 

## 2023-01-02 NOTE — Assessment & Plan Note (Addendum)
-   This is concerning for right upper abdominal abscess versus biloma. - Dr. Aleen Campi was consulted and evaluated the patient. - HIDA scan will be obtained in a.m. - He highly doubt that there is a perforation causing the free air noted on CT.  He believes this is just residual but since the patient does have a good size fluid collection in the RUQ that could be an abscess or biloma he ordered a HIDA scan for tomorrow morning to evaluate. - He recommended adding IV Zosyn and holding off anticoagulation and Lovenox for potential procedure whether IR drainage or possibly ERCP. - Pain management will be provided.

## 2023-01-02 NOTE — ED Triage Notes (Signed)
Pt presents to ED with c/o ABD discomfort, pt states recent gallbladder removal. Pt states he was sent after a contrast CT and stated to come to ED for further eval. Pt is A&Ox4 at this time. NAD noted.

## 2023-01-02 NOTE — ED Provider Notes (Signed)
[facility] Provider Note    Event Date/Time   First MD Initiated Contact with Patient 01/02/23 2057     (approximate)   History   Abdominal Pain   HPI  Justin Adkins is a 57 y.o. male past medical history of diabetes, peripheral vascular disease, hypertension, hyperlipidemia who presents with abdominal pain.  Patient had cholecystectomy 5/9.  Since that time he has had epigastric discomfort some nausea but no vomiting.  He had 3 bowel movement since surgery has been somewhat constipated.  Denies fevers or chills.  Denies blood in his stool.  No chest pain.  He has not been taking his insulin since surgery.    Past Medical History:  Diagnosis Date   Atherosclerosis of native arteries of extremity with intermittent claudication [facility])    ED (erectile dysfunction)    Elevated liver enzymes    GERD (gastroesophageal reflux disease)    History of echocardiogram    a. 07/2020 Echo: EF 60-65%, no rwma, mild LVH. Nl RV fxn. Mildly dil LA.   History of tobacco abuse    Hyperlipidemia    Hypertension    Peripheral vascular disease (HCC)    a. 05/2019 s/p PTA R popliteal/AT, mech thrombectomy to R popliteal/tibioperoneal trunk--> plavix & eliquis 2.5 bid since; 09/2019 LE u/s: near nl study.   Popliteal artery occlusion, right (HCC)    Type 2 diabetes mellitus (HCC)    Ulcerative colitis [facility])     Patient Active Problem List   Diagnosis Date Noted   DKA, type 2 (HCC) 01/02/2023   Atherosclerosis of native arteries of extremity with intermittent claudication (HCC) 06/09/2021   Essential hypertension 06/09/2021   Acute hepatitis 07/24/2020   PVD (peripheral vascular disease) (HCC) 05/31/2019   Popliteal artery occlusion, right (HCC) 05/20/2019   Atherosclerosis of native arteries of extremity with rest pain (HCC) 05/19/2019   Hyperlipidemia 05/19/2019   Hyperlipidemia associated with type 2 diabetes mellitus (HCC) 05/19/2019   Type 2 diabetes mellitus with  peripheral neuropathy (HCC) 01/05/2018   Other male erectile dysfunction 01/05/2018   Total or mature cataract 03/05/2017   Tobacco abuse 05/03/2013     Physical Exam  Triage Vital Signs: ED Triage Vitals [01/02/23 1848]  Enc Vitals Group     BP 137/88     Pulse Rate (!) 108     Resp 18     Temp 97.6 F (36.4 C)     Temp Source Oral     SpO2 97 %     Weight      Height      Head Circumference      Peak Flow      Pain Score 6     Pain Loc      Pain Edu?      Excl. in GC?     Most recent vital signs: Vitals:   01/02/23 2237 01/02/23 2315  BP: (!) 119/100 (!) 179/89  Pulse: 90 94  Resp: 20 19  Temp: 98.3 F (36.8 C) 98.1 F (36.7 C)  SpO2: 98% 98%     General: Awake, no distress.  CV:  Good peripheral perfusion.  Resp:  Normal effort.  Abd:  No distention.  Recent lap incisions look clean and dry, abdomen is mildly tender throughout but there is no guarding soft Neuro:             Awake, Alert, Oriented x 3  Other:     ED Results / Procedures / Treatments  Labs (all labs ordered are listed, but only abnormal results are displayed) Labs Reviewed  CBC WITH DIFFERENTIAL/PLATELET - Abnormal; Notable for the following components:      Result Value   WBC 13.7 (*)    Platelets 423 (*)    Neutro Abs 12.1 (*)    Abs Immature Granulocytes 0.08 (*)    All other components within normal limits  COMPREHENSIVE METABOLIC PANEL - Abnormal; Notable for the following components:   Sodium 129 (*)    Chloride 94 (*)    CO2 11 (*)    Glucose, Bld 366 (*)    BUN 23 (*)    AST 11 (*)    Alkaline Phosphatase 129 (*)    Total Bilirubin 2.1 (*)    Anion gap 24 (*)    All other components within normal limits  URINALYSIS, ROUTINE W REFLEX MICROSCOPIC - Abnormal; Notable for the following components:   Color, Urine YELLOW (*)    APPearance CLEAR (*)    Specific Gravity, Urine 1.039 (*)    Glucose, UA >=500 (*)    Hgb urine dipstick SMALL (*)    Ketones, ur 80 (*)    All  other components within normal limits  BETA-HYDROXYBUTYRIC ACID - Abnormal; Notable for the following components:   Beta-Hydroxybutyric Acid 6.30 (*)    All other components within normal limits  LACTIC ACID, PLASMA  MAGNESIUM  PHOSPHORUS  LIPASE, BLOOD  LACTIC ACID, PLASMA  LACTIC ACID, PLASMA  LACTIC ACID, PLASMA  BLOOD GAS, VENOUS  HIV ANTIBODY (ROUTINE TESTING W REFLEX)  BASIC METABOLIC PANEL  BASIC METABOLIC PANEL  BASIC METABOLIC PANEL  BASIC METABOLIC PANEL  BETA-HYDROXYBUTYRIC ACID  BETA-HYDROXYBUTYRIC ACID  HEMOGLOBIN A1C  CBC  BASIC METABOLIC PANEL  BETA-HYDROXYBUTYRIC ACID     EKG     RADIOLOGY I reviewed the CT of the abdomen pelvis and interpreted from earlier today which shows free intraperitoneal air  PROCEDURES:  Critical Care performed: Yes, see critical care procedure note(s)  Procedures  The patient is on the cardiac monitor to evaluate for evidence of arrhythmia and/or significant heart rate changes.   MEDICATIONS ORDERED IN ED: Medications  insulin regular, human (MYXREDLIN) 100 units/ 100 mL infusion (16 Units/hr Intravenous New Bag/Given 01/02/23 2327)  lactated ringers infusion ( Intravenous New Bag/Given 01/02/23 2331)  dextrose 5 % in lactated ringers infusion (has no administration in time range)  dextrose 50 % solution 0-50 mL (has no administration in time range)  potassium chloride 10 mEq in 100 mL IVPB (10 mEq Intravenous New Bag/Given 01/02/23 2312)  oxyCODONE (Oxy IR/ROXICODONE) immediate release tablet 5 mg (has no administration in time range)  atorvastatin (LIPITOR) tablet 20 mg (has no administration in time range)  traMADol (ULTRAM) tablet 50 mg (has no administration in time range)  docusate sodium (COLACE) capsule 100 mg (has no administration in time range)  mesalamine (LIALDA) EC tablet 4.8 g (has no administration in time range)  0.9 %  sodium chloride infusion (has no administration in time range)  acetaminophen  (TYLENOL) tablet 650 mg (has no administration in time range)    Or  acetaminophen (TYLENOL) suppository 650 mg (has no administration in time range)  traZODone (DESYREL) tablet 25 mg (has no administration in time range)  magnesium hydroxide (MILK OF MAGNESIA) suspension 30 mL (has no administration in time range)  ondansetron (ZOFRAN) tablet 4 mg (has no administration in time range)    Or  ondansetron (ZOFRAN) injection 4 mg (has no administration in  time range)  pantoprazole (PROTONIX) injection 40 mg (40 mg Intravenous Given 01/02/23 2331)  piperacillin-tazobactam (ZOSYN) IVPB 3.375 g (has no administration in time range)  lactated ringers bolus 1,000 mL (0 mLs Intravenous Stopped 01/02/23 2317)  lactated ringers bolus 1,000 mL (0 mLs Intravenous Stopped 01/02/23 2317)  piperacillin-tazobactam (ZOSYN) IVPB 3.375 g (0 g Intravenous Stopped 01/02/23 2317)     IMPRESSION / MDM / ASSESSMENT AND PLAN / ED COURSE  I reviewed the triage vital signs and the nursing notes.                              Patient's presentation is most consistent with acute presentation with potential threat to life or bodily function.  Differential diagnosis includes, but is not limited to, perforated viscus, bile leak, abscess, DKA,  Patient is a 57 year old male with recent cholecystectomy who presents with abdominal pain.  Since his cholecystectomy 8 days ago patient has had ongoing epigastric pain and some nausea.  Saw Dr. Vickii Chafe in follow-up ordered a CT of the abdomen pelvis.  Patient was called back by radiology as there was concern for increased amount of intraperitoneal air than would be typically expected this far out from surgery.  Patient also has area of fluid around the gallbladder fossa.  Patient looks well actually on exam and his abdominal exam is benign is not consistent with perforated viscus.  Did reach out to Dr. Aleen Campi who will see the patient.  His labs however are notable for anion gap  acidosis bicarb of 11 anion gap of 24 blood sugar 366.  Patient is diabetic has not been taking his insulin since surgery.  This is consistent with DKA.  Will check a gas beta hydroxybutyrate and extended electrolytes.  His potassium is 4.1.  Patient is received 2 L of LR.  Will start IV potassium supplementation and insulin drip.  Dr. Aleen Campi did evaluate the patient and agrees that perforated viscus is less likely.  He is ordering a HIDA scan to evaluate for possible bile leak and will likely reach out to IR in the morning for possible drainage.  Recommend empiric antibiotics at this time.  Have ordered Zosyn.       FINAL CLINICAL IMPRESSION(S) / ED DIAGNOSES   Final diagnoses:  Diabetic ketoacidosis without coma associated with type 2 diabetes mellitus (HCC)     Rx / DC Orders   ED Discharge Orders     None        Note:  This document was prepared using Dragon voice recognition software and may include unintentional dictation errors.   Georga Hacking, MD 01/02/23 [phone]

## 2023-01-02 NOTE — Assessment & Plan Note (Signed)
-   The patient will be admitted to a stepdown bed. - We will continue her on IV insulin drip per [employer] DKA protocol. - The patient will be aggressively hydrated with IV lactated ringer. - Will follow serial BMPs.

## 2023-01-03 ENCOUNTER — Inpatient Hospital Stay: Payer: BC Managed Care – PPO

## 2023-01-03 DIAGNOSIS — K51919 Ulcerative colitis, unspecified with unspecified complications: Secondary | ICD-10-CM

## 2023-01-03 DIAGNOSIS — E785 Hyperlipidemia, unspecified: Secondary | ICD-10-CM | POA: Insufficient documentation

## 2023-01-03 DIAGNOSIS — K668 Other specified disorders of peritoneum: Secondary | ICD-10-CM

## 2023-01-03 DIAGNOSIS — R188 Other ascites: Secondary | ICD-10-CM | POA: Diagnosis not present

## 2023-01-03 DIAGNOSIS — E1110 Type 2 diabetes mellitus with ketoacidosis without coma: Secondary | ICD-10-CM | POA: Diagnosis not present

## 2023-01-03 DIAGNOSIS — K219 Gastro-esophageal reflux disease without esophagitis: Secondary | ICD-10-CM

## 2023-01-03 DIAGNOSIS — E871 Hypo-osmolality and hyponatremia: Secondary | ICD-10-CM | POA: Insufficient documentation

## 2023-01-03 LAB — BLOOD GAS, VENOUS
Bicarbonate: 24.3 mmol/L (ref 20.0–28.0)
O2 Saturation: 42.3 %

## 2023-01-03 LAB — BASIC METABOLIC PANEL
Anion gap: 17 — ABNORMAL HIGH (ref 5–15)
Anion gap: 11 (ref 5–15)
Anion gap: 7 (ref 5–15)
Anion gap: 8 (ref 5–15)
BUN: 19 mg/dL (ref 6–20)
BUN: 18 mg/dL (ref 6–20)
BUN: 15 mg/dL (ref 6–20)
BUN: 13 mg/dL (ref 6–20)
CO2: 19 mmol/L — ABNORMAL LOW (ref 22–32)
CO2: 21 mmol/L — ABNORMAL LOW (ref 22–32)
CO2: 23 mmol/L (ref 22–32)
CO2: 25 mmol/L (ref 22–32)
Calcium: 9.4 mg/dL (ref 8.9–10.3)
Calcium: 8.7 mg/dL — ABNORMAL LOW (ref 8.9–10.3)
Calcium: 8.6 mg/dL — ABNORMAL LOW (ref 8.9–10.3)
Calcium: 8.3 mg/dL — ABNORMAL LOW (ref 8.9–10.3)
Chloride: 99 mmol/L (ref 98–111)
Chloride: 103 mmol/L (ref 98–111)
Chloride: 105 mmol/L (ref 98–111)
Chloride: 103 mmol/L (ref 98–111)
Creatinine, Ser: 0.83 mg/dL (ref 0.61–1.24)
Creatinine, Ser: 0.6 mg/dL — ABNORMAL LOW (ref 0.61–1.24)
Creatinine, Ser: 0.49 mg/dL — ABNORMAL LOW (ref 0.61–1.24)
Creatinine, Ser: 0.56 mg/dL — ABNORMAL LOW (ref 0.61–1.24)
GFR, Estimated: >60 mL/min (ref >60)
GFR, Estimated: >60 mL/min (ref >60)
GFR, Estimated: >60 mL/min (ref >60)
GFR, Estimated: >60 mL/min (ref >60)
Glucose, Bld: 248 mg/dL — ABNORMAL HIGH (ref 70–99)
Glucose, Bld: 198 mg/dL — ABNORMAL HIGH (ref 70–99)
Glucose, Bld: 185 mg/dL — ABNORMAL HIGH (ref 70–99)
Glucose, Bld: 162 mg/dL — ABNORMAL HIGH (ref 70–99)
Potassium: 3.6 mmol/L (ref 3.5–5.1)
Potassium: 3.5 mmol/L (ref 3.5–5.1)
Potassium: 3.2 mmol/L — ABNORMAL LOW (ref 3.5–5.1)
Potassium: 3.2 mmol/L — ABNORMAL LOW (ref 3.5–5.1)
Sodium: 135 mmol/L (ref 135–145)
Sodium: 135 mmol/L (ref 135–145)
Sodium: 135 mmol/L (ref 135–145)
Sodium: 136 mmol/L (ref 135–145)

## 2023-01-03 LAB — CBC
HCT: 40.2 % (ref 39.0–52.0)
Hemoglobin: 14 g/dL (ref 13.0–17.0)
MCH: 30.3 pg (ref 26.0–34.0)
MCHC: 34.8 g/dL (ref 30.0–36.0)
MCV: 87 fL (ref 80.0–100.0)
Platelets: 297 10*3/uL (ref 150–400)
RBC: 4.62 MIL/uL (ref 4.22–5.81)
RDW: 12.7 % (ref 11.5–15.5)
WBC: 11.2 10*3/uL — ABNORMAL HIGH (ref 4.0–10.5)
nRBC: 0 % (ref 0.0–0.2)

## 2023-01-03 LAB — HEMOGLOBIN A1C
Hgb A1c MFr Bld: 9.8 % — ABNORMAL HIGH (ref 4.8–5.6)
Mean Plasma Glucose: 234.56 mg/dL

## 2023-01-03 LAB — LACTIC ACID, PLASMA
Lactic Acid, Venous: 1.4 mmol/L (ref 0.5–1.9)
Lactic Acid, Venous: 1.3 mmol/L (ref 0.5–1.9)

## 2023-01-03 LAB — BETA-HYDROXYBUTYRIC ACID
Beta-Hydroxybutyric Acid: 3.96 mmol/L — ABNORMAL HIGH (ref 0.05–0.27)
Beta-Hydroxybutyric Acid: 0.37 mmol/L — ABNORMAL HIGH (ref 0.05–0.27)
Beta-Hydroxybutyric Acid: 1.14 mmol/L — ABNORMAL HIGH (ref 0.05–0.27)

## 2023-01-03 LAB — AEROBIC/ANAEROBIC CULTURE W GRAM STAIN (SURGICAL/DEEP WOUND)

## 2023-01-03 LAB — HIV ANTIBODY (ROUTINE TESTING W REFLEX): HIV Screen 4th Generation wRfx: NONREACTIVE

## 2023-01-03 MED ORDER — POTASSIUM CHLORIDE 20 MEQ PO PACK
40.0000 meq | PACK | Freq: Once | ORAL | Status: AC
  Administered 2023-01-03: 40 meq via ORAL
  Filled 2023-01-03: qty 2

## 2023-01-03 MED ORDER — TECHNETIUM TC 99M MEBROFENIN IV KIT
7.5000 | PACK | Freq: Once | INTRAVENOUS | Status: AC | PRN
  Administered 2023-01-03: 7.16 via INTRAVENOUS

## 2023-01-03 MED ORDER — INSULIN ASPART 100 UNIT/ML IJ SOLN: 0.0000 [IU] | Freq: Three times a day (TID) | INTRAMUSCULAR | Status: DC

## 2023-01-03 MED ORDER — ORAL CARE MOUTH RINSE: 15.0000 mL | OROMUCOSAL | Status: DC | PRN

## 2023-01-03 MED ORDER — INSULIN GLARGINE-YFGN 100 UNIT/ML ~~LOC~~ SOLN
15.0000 [IU] | Freq: Every day | SUBCUTANEOUS | Status: DC
  Administered 2023-01-03: 15 [IU] via SUBCUTANEOUS
  Filled 2023-01-03 (×2): qty 0.15

## 2023-01-03 MED ORDER — MIDAZOLAM HCL 2 MG/2ML IJ SOLN
INTRAMUSCULAR | Status: AC
  Filled 2023-01-03: qty 4

## 2023-01-03 MED ORDER — INSULIN ASPART 100 UNIT/ML IJ SOLN
0.0000 [IU] | Freq: Every day | INTRAMUSCULAR | Status: DC
  Administered 2023-01-03: 3 [IU] via SUBCUTANEOUS
  Filled 2023-01-03: qty 1

## 2023-01-03 MED ORDER — POTASSIUM CHLORIDE 10 MEQ/100ML IV SOLN
10.0000 meq | Freq: Once | INTRAVENOUS | Status: AC
  Administered 2023-01-03: 10 meq via INTRAVENOUS
  Filled 2023-01-03: qty 100

## 2023-01-03 MED ORDER — FENTANYL CITRATE (PF) 100 MCG/2ML IJ SOLN
INTRAMUSCULAR | Status: AC
  Filled 2023-01-03: qty 4

## 2023-01-03 MED ORDER — SODIUM CHLORIDE 0.9% FLUSH
5.0000 mL | Freq: Three times a day (TID) | INTRAVENOUS | Status: DC
  Administered 2023-01-03 – 2023-01-06 (×9): 5 mL

## 2023-01-03 MED ORDER — INSULIN ASPART 100 UNIT/ML IJ SOLN
0.0000 [IU] | Freq: Three times a day (TID) | INTRAMUSCULAR | Status: DC
  Administered 2023-01-03 – 2023-01-04 (×3): 2 [IU] via SUBCUTANEOUS
  Filled 2023-01-03 (×3): qty 1

## 2023-01-03 MED ORDER — CHLORHEXIDINE GLUCONATE CLOTH 2 % EX PADS
6.0000 | MEDICATED_PAD | Freq: Every day | CUTANEOUS | Status: DC
  Administered 2023-01-03 – 2023-01-06 (×3): 6 via TOPICAL

## 2023-01-03 MED ORDER — MIDAZOLAM HCL 2 MG/2ML IJ SOLN
INTRAMUSCULAR | Status: AC | PRN
  Administered 2023-01-03: 1 mg via INTRAVENOUS

## 2023-01-03 MED ORDER — FENTANYL CITRATE (PF) 100 MCG/2ML IJ SOLN
INTRAMUSCULAR | Status: AC | PRN
  Administered 2023-01-03: 50 ug via INTRAVENOUS

## 2023-01-03 NOTE — Consult Note (Signed)
PHARMACY CONSULT NOTE - ELECTROLYTES  Pharmacy Consult for Electrolyte Monitoring and Replacement   Recent Labs: Potassium (mmol/L)  Date Value  01/03/2023 3.2 (L)   Magnesium (mg/dL)  Date Value  16/05/9603 1.8   Calcium (mg/dL)  Date Value  54/04/8118 8.3 (L)   Albumin (g/dL)  Date Value  14/78/2956 3.6   Phosphorus (mg/dL)  Date Value  21/30/8657 3.5   Sodium (mmol/L)  Date Value  01/03/2023 136  09/10/2020 141   Corrected Ca: 8.6 mg/dL  Assessment  Justin Adkins is a 57 y.o. male presenting with abdominal pain. PMH significant for T2DM, UC, HLD, TUD, PVD, HTN. Pharmacy has been consulted to monitor and replace electrolytes.  Diet: NPO MIVF: LR @ 75 mL/hr  Goal of Therapy: Electrolytes WNL  Plan:  Give KCl 40 mEq PO x1 Check BMP with AM labs  Thank you for allowing pharmacy to be a part of this patient's care.  Celene Squibb, PharmD PGY1 Pharmacy Resident 01/03/2023 1:39 PM

## 2023-01-03 NOTE — Plan of Care (Signed)
Discussed with patient plan of care for the evening, pain management and admission questions with some teach back displayed  Problem: Education: Goal: Ability to describe self-care measures that may prevent or decrease complications (Diabetes Survival Skills Education) will improve Outcome: Progressing   Problem: Coping: Goal: Ability to adjust to condition or change in health will improve Outcome: Progressing

## 2023-01-03 NOTE — Progress Notes (Signed)
Outpatient Surgical Follow Up  01/03/2023  Justin Adkins is an 57 y.o. male.   Chief Complaint  Patient presents with   Routine Post Op    HPI: 1 week out rob GB, difficutl case due to chronic inflammation. He has decent apetit, no fevers, chill.s  Has some abdominal pain and run out of narcotics. He came on his own . No N/V  Past Medical History:  Diagnosis Date   Atherosclerosis of native arteries of extremity with intermittent claudication [facility])    ED (erectile dysfunction)    Elevated liver enzymes    GERD (gastroesophageal reflux disease)    History of echocardiogram    a. 07/2020 Echo: EF 60-65%, no rwma, mild LVH. Nl RV fxn. Mildly dil LA.   History of tobacco abuse    Hyperlipidemia    Hypertension    Peripheral vascular disease (HCC)    a. 05/2019 s/p PTA R popliteal/AT, mech thrombectomy to R popliteal/tibioperoneal trunk--> plavix & eliquis 2.5 bid since; 09/2019 LE u/s: near nl study.   Popliteal artery occlusion, right (HCC)    Type 2 diabetes mellitus (HCC)    Ulcerative colitis [facility])     Past Surgical History:  Procedure Laterality Date   COLONOSCOPY     COLONOSCOPY N/A 09/24/2020   Procedure: COLONOSCOPY;  Surgeon: Regis Bill, MD;  Location: [facility] ENDOSCOPY;  Service: Endoscopy;  Laterality: N/A;   COLONOSCOPY WITH PROPOFOL N/A 11/07/2022   Procedure: COLONOSCOPY WITH PROPOFOL;  Surgeon: Regis Bill, MD;  Location: [facility] ENDOSCOPY;  Service: Endoscopy;  Laterality: N/A;   ESOPHAGOGASTRODUODENOSCOPY (EGD) WITH PROPOFOL N/A 02/28/2020   Procedure: ESOPHAGOGASTRODUODENOSCOPY (EGD) WITH PROPOFOL;  Surgeon: Regis Bill, MD;  Location: [facility] ENDOSCOPY;  Service: Endoscopy;  Laterality: N/A;   FOOT SURGERY Right    HERNIA REPAIR     inguinal hernia   LOWER EXTREMITY ANGIOGRAPHY Right 05/25/2019   Procedure: LOWER EXTREMITY ANGIOGRAPHY;  Surgeon: Renford Dills, MD;  Location: [facility] INVASIVE CV LAB;  Service: Cardiovascular;  Laterality:  Right;   LOWER EXTREMITY ANGIOGRAPHY Right 07/02/2021   Procedure: LOWER EXTREMITY ANGIOGRAPHY;  Surgeon: Renford Dills, MD;  Location: [facility] INVASIVE CV LAB;  Service: Cardiovascular;  Laterality: Right;   TONSILLECTOMY     VASCULAR SURGERY      Family History  Problem Relation Age of Onset   Cancer Father    Heart attack Brother        MI @ 75    Social History:  reports that he quit smoking about 3 years ago. His smoking use included cigarettes. He has a 35.00 pack-year smoking history. He has been exposed to tobacco smoke. He has never used smokeless tobacco. He reports current alcohol use. He reports that he does not use drugs.  Allergies:  Allergies  Allergen Reactions   Naproxen Anaphylaxis   Liraglutide Nausea Only    (Victoza)    Medications reviewed.    ROS Full ROS performed and is otherwise negative other than what is stated in HPI   BP (!) 148/94   Pulse (!) 102   Temp 98 F (36.7 C)   Ht 5\' 8"  (1.727 m)   Wt 147 lb (66.7 kg)   SpO2 97%   BMI 22.35 kg/m   Physical Exam  NAD alert, walks to the office w/o difficulty Abd: soft, incision c/d/I, drain w some dark serous output, about 5-10cc in the bulb, ( pt has not emptied jp in 48 hrs) I went ahead and removed the  drain.  ZOX:WRUEA and well perfused  Assessment/Plan: Abdominal pain after difficult cholecystectomy last week.  Clinically he is not toxic there is minimal output from the drain.  He is certainly difficult to read.  He is certainly not peritonitic nor septic.  We will do a refill of oxycodone and will also obtain a CT scan of the abdomen pelvis to further evaluate for any potential complications.  Will continue to Short follow-up.  No need for hospitalization or emergency surgical intervention at this time  Sterling Big, MD [facility] General Surgeon

## 2023-01-03 NOTE — Assessment & Plan Note (Signed)
-   PPI therapy will be continued.

## 2023-01-03 NOTE — Procedures (Signed)
Interventional Radiology Procedure Note  Procedure: CT guided abdominal drain placement  Findings: Please refer to procedural dictation for full description. 10.2 Fr pigtail drain in subhepatic fluid collection.  Approximately 30 mL of serosanguinous fluid aspirated.  Sample sent for culture.  Drain to bulb suction.  Complications:  None immediate  Estimated Blood Loss: < 5 ml  Recommendations: Keep to bulb suction. IR will follow.   Marliss Coots, MD

## 2023-01-03 NOTE — Assessment & Plan Note (Signed)
-   This is likely pseudohyponatremia from DKA. - Will follow sodium level with hydration.

## 2023-01-03 NOTE — Consult Note (Signed)
Chief Complaint: Patient was seen in consultation today for post-operative abdominal fluid collection  Referring Physician(s): Henrene Dodge, MD  History of Present Illness: Justin Adkins is a 57 y.o. male with history of chronic cholecystitis status post laparoscopic cholecystectomy and lysis of adhesions on 12/25/22.  He presented to [facility] yesterday after post operative CT scan demonstrated subhepatic fluid collection concerning for abscess, along with mild leukocytosis and abdominal pain.  Consult for fluid collection drain placement.  Past Medical History:  Diagnosis Date   Atherosclerosis of native arteries of extremity with intermittent claudication [facility])    ED (erectile dysfunction)    Elevated liver enzymes    GERD (gastroesophageal reflux disease)    History of echocardiogram    a. 07/2020 Echo: EF 60-65%, no rwma, mild LVH. Nl RV fxn. Mildly dil LA.   History of tobacco abuse    Hyperlipidemia    Hypertension    Peripheral vascular disease (HCC)    a. 05/2019 s/p PTA R popliteal/AT, mech thrombectomy to R popliteal/tibioperoneal trunk--> plavix & eliquis 2.5 bid since; 09/2019 LE u/s: near nl study.   Popliteal artery occlusion, right (HCC)    Type 2 diabetes mellitus (HCC)    Ulcerative colitis [facility])     Past Surgical History:  Procedure Laterality Date   COLONOSCOPY     COLONOSCOPY N/A 09/24/2020   Procedure: COLONOSCOPY;  Surgeon: Regis Bill, MD;  Location: [facility] ENDOSCOPY;  Service: Endoscopy;  Laterality: N/A;   COLONOSCOPY WITH PROPOFOL N/A 11/07/2022   Procedure: COLONOSCOPY WITH PROPOFOL;  Surgeon: Regis Bill, MD;  Location: [facility] ENDOSCOPY;  Service: Endoscopy;  Laterality: N/A;   ESOPHAGOGASTRODUODENOSCOPY (EGD) WITH PROPOFOL N/A 02/28/2020   Procedure: ESOPHAGOGASTRODUODENOSCOPY (EGD) WITH PROPOFOL;  Surgeon: Regis Bill, MD;  Location: [facility] ENDOSCOPY;  Service: Endoscopy;  Laterality: N/A;   FOOT SURGERY Right    HERNIA REPAIR      inguinal hernia   LOWER EXTREMITY ANGIOGRAPHY Right 05/25/2019   Procedure: LOWER EXTREMITY ANGIOGRAPHY;  Surgeon: Renford Dills, MD;  Location: [facility] INVASIVE CV LAB;  Service: Cardiovascular;  Laterality: Right;   LOWER EXTREMITY ANGIOGRAPHY Right 07/02/2021   Procedure: LOWER EXTREMITY ANGIOGRAPHY;  Surgeon: Renford Dills, MD;  Location: [facility] INVASIVE CV LAB;  Service: Cardiovascular;  Laterality: Right;   TONSILLECTOMY     VASCULAR SURGERY      Allergies: Naproxen and Liraglutide  Medications: Prior to Admission medications   Medication Sig Start Date End Date Taking? Authorizing Provider  atorvastatin (LIPITOR) 20 MG tablet Take 1 tablet (20 mg total) by mouth daily. Patient taking differently: Take 20 mg by mouth every morning. 03/11/21 06/28/26 Yes Agbor-Etang, Arlys John, MD  clopidogrel (PLAVIX) 75 MG tablet Take 1 tablet (75 mg total) by mouth daily. Patient taking differently: Take 75 mg by mouth every morning. 09/03/22  Yes Georgiana Spinner, NP  docusate sodium (COLACE) 100 MG capsule Take 100 mg by mouth 2 (two) times daily as needed for mild constipation.   Yes [provider]  ergocalciferol (VITAMIN D2) 1.25 MG (50000 UT) capsule Take 50,000 Units by mouth every Saturday.   Yes [provider]  FARXIGA 10 MG TABS tablet Take 10 mg by mouth daily. 07/03/20  Yes [provider]  HYDROcodone-acetaminophen (NORCO/VICODIN) 5-325 MG tablet Take 1-2 tablets by mouth every 6 (six) hours as needed for moderate pain. 12/25/22  Yes Pabon, Diego F, MD  ketoconazole (NIZORAL) 2 % shampoo Apply 1 Application topically 3 (three) times a week. 01/19/22  Yes [provider]  mesalamine (LIALDA) 1.2 g EC tablet Take 4.8 g by mouth daily with breakfast. 10/01/20  Yes [provider]  NOVOLIN N 100 UNIT/ML injection Inject 40 Units into the skin 2 (two) times daily before a meal. 05/27/19  Yes [provider]  oxyCODONE (ROXICODONE) 5 MG  immediate release tablet Take 1 tablet (5 mg total) by mouth every 4 (four) hours as needed for severe pain. 12/31/22  Yes Pabon, Diego F, MD  OZEMPIC, 1 MG/DOSE, 4 MG/3ML SOPN Inject 1 mg into the skin every 7 (seven) days.   Yes [provider]  pantoprazole (PROTONIX) 40 MG tablet Take 1 tablet (40 mg total) by mouth daily. Patient taking differently: Take 40 mg by mouth as needed. 12/04/22 12/04/23 Yes Jene Every, MD  rivaroxaban (XARELTO) 2.5 MG TABS tablet Take 1 tablet (2.5 mg total) by mouth 2 (two) times daily. 09/03/22  Yes Georgiana Spinner, NP  traMADol (ULTRAM) 50 MG tablet Take 1 tablet (50 mg total) by mouth every 6 (six) hours as needed. 12/04/22 12/04/23 Yes Jene Every, MD  ondansetron (ZOFRAN-ODT) 4 MG disintegrating tablet Take by mouth. Patient not taking: Reported on 01/02/2023 08/28/21   [provider]  Semaglutide, 1 MG/DOSE, (OZEMPIC, 1 MG/DOSE,) 2 MG/1.5ML SOPN Inject 1 mg into the skin once a week. On Saturdays    [provider]     Family History  Problem Relation Age of Onset   Cancer Father    Heart attack Brother        MI @ 5    Social History   Socioeconomic History   Marital status: Widowed    Spouse name: Not on file   Number of children: Not on file   Years of education: Not on file   Highest education level: Not on file  Occupational History   Not on file  Tobacco Use   Smoking status: Former    Packs/day: 1.00    Years: 35.00    Additional pack years: 0.00    Total pack years: 35.00    Types: Cigarettes    Quit date: 2021    Years since quitting: 3.3    Passive exposure: Past   Smokeless tobacco: Never  Vaping Use   Vaping Use: Never used  Substance and Sexual Activity   Alcohol use: Yes    Comment: rarely   Drug use: No   Sexual activity: Yes    Birth control/protection: None  Other Topics Concern   Not on file  Social History Narrative   Lives in [place] w/ wife and son.  Does not routinely exercise.    Social Determinants of Health   Financial Resource Strain: Not on file  Food Insecurity: No Food Insecurity (01/03/2023)   Hunger Vital Sign    Worried About Running Out of Food in the Last Year: Never true    Ran Out of Food in the Last Year: Never true  Transportation Needs: No Transportation Needs (01/03/2023)   PRAPARE - Administrator, Civil Service (Medical): No    Lack of Transportation (Non-Medical): No  Physical Activity: Not on file  Stress: Not on file  Social Connections: Not on file   Review of Systems: A 12 point ROS discussed and pertinent positives are indicated in the HPI above.  All other systems are negative.  Vital Signs: BP 130/77   Pulse 76   Temp 98.2 F (36.8 C) (Oral)   Resp 17  Ht 5\' 8"  (1.727 m)   Wt 62.7 kg   SpO2 96%   BMI 21.02 kg/m   Physical Exam Constitutional:      General: He is not in acute distress. HENT:     Head: Normocephalic.     Mouth/Throat:     Comments: MP2 Eyes:     General: No scleral icterus. Cardiovascular:     Rate and Rhythm: Normal rate.  Pulmonary:     Breath sounds: Normal breath sounds.  Abdominal:     General: There is no distension.     Tenderness: There is no abdominal tenderness.  Musculoskeletal:        General: No swelling.  Skin:    General: Skin is warm and dry.     Coloration: Skin is not jaundiced.  Neurological:     Mental Status: He is alert and oriented to person, place, and time.     Imaging: NM HEPATOBILIARY LEAK (POST-SURGICAL)  Result Date: 01/03/2023 CLINICAL DATA:  Postop from cholecystectomy. Abdominal pain and abnormal abdominal fluid collection on recent CT. Evaluate for postop bile leak. EXAM: NUCLEAR MEDICINE HEPATOBILIARY IMAGING TECHNIQUE: Sequential images of the abdomen were obtained out to 60 minutes following intravenous administration of radiopharmaceutical. RADIOPHARMACEUTICALS:  7.2 mCi Tc-51m  Choletec IV COMPARISON:  None Available. FINDINGS: Prompt uptake  and biliary excretion of activity by the liver is seen. No gallbladder activity is visualized, consistent with prior cholecystectomy. Biliary activity passes into small bowel, consistent with patent common bile duct. No extravasation of biliary activity is seen on this study. IMPRESSION: Prior cholecystectomy.  No evidence of postop bile leak. Electronically Signed   By: Danae Orleans M.D.   On: 01/03/2023 13:28   CT Abdomen Pelvis W Contrast  Addendum Date: 01/02/2023   ADDENDUM REPORT: 01/02/2023 17:20 ADDENDUM: The original report was by Dr. Gaylyn Rong. The following addendum is by Dr. Gaylyn Rong: Radiology personnel were unable to reach Dr. Everlene Farrier by telephone, possibly related to being after hours. We were directed to Dr. Aleen Campi who is on-call, but was in surgery. I discussed the case briefly with Dr. Aleen Campi from the operating room, and he directed [place] to have the patient proceed to the emergency room. Accordingly, I called Mr. Nighswonger at 5:05 p.m. and after discussing some of the findings on the CT scan he agreed to proceed to the [facility] emergency room for further assessment. I telephoned the emergency department and spoke to attending physician Dr. Derrill Kay. We discussed the case in brief so that he would be oriented to the patient. We also left a voicemail on Dr. Hurman Horn phone indicating this series of events along with the patient's contact information. Electronically Signed   By: Gaylyn Rong M.D.   On: 01/02/2023 17:20   Result Date: 01/02/2023 CLINICAL DATA:  Postoperative abdominal pain. Cholecystectomy eight days ago. Weight loss. Reduced appetite. EXAM: CT ABDOMEN AND PELVIS WITH CONTRAST TECHNIQUE: Multidetector CT imaging of the abdomen and pelvis was performed using the standard protocol following bolus administration of intravenous contrast. RADIATION DOSE REDUCTION: This exam was performed according to the departmental dose-optimization program which  includes automated exposure control, adjustment of the mA and/or kV according to patient size and/or use of iterative reconstruction technique. CONTRAST:  80mL OMNIPAQUE IOHEXOL 300 MG/ML  SOLN COMPARISON:  07/16/2022 and ultrasound 12/04/2022 FINDINGS: Lower chest: Subsegmental atelectasis in the posterior basal segment right lower lobe. Hepatobiliary: Trace fluid along the gallbladder fossa as can commonly be encountered at this stage  status post cholecystectomy. However, there is additional fluid tracking below the right hepatic lobe with enhancing margins measuring up to about 11.3 by 4.5 by 4.0 cm (volume = 110 cm^3), cannot exclude abscess or biloma. There is no gas in this collection. Mild focal steatosis in the liver adjacent to the falciform ligament. No biliary dilatation. Pancreas: Unremarkable Spleen: Unremarkable Adrenals/Urinary Tract: Unremarkable Stomach/Bowel: Small type 1 hiatal hernia. Diffuse colon wall thickening along with some mild wall thickening of the terminal ileum. The rectal wall does not appear thickened. Vascular/Lymphatic: Primarily soft plaque in the abdominal aorta and iliac vessels without substantial narrowing or specific abnormality in the proximal celiac trunk or SMA. I do not see any portal venous gas. Reproductive: Unremarkable Other: There is a moderate amount of scattered free intraperitoneal gas primarily along the liver edge and in the upper abdomen. This is somewhat more gas than I would typically expect on postoperative day 8 status post CO2 laparoscopy. This raises the possibility of a perforated viscus, but I do not see a specific site of perforation, and no spillage of the oral contrast into the abdominal cavity is observed, in particular I see no definite spilled contrast in the vicinity of the descending duodenum closest to the residual fluid along the gallbladder fossa. Musculoskeletal: Unremarkable IMPRESSION: 1. Moderate amount of scattered free intraperitoneal  gas primarily along the liver edge and in the upper abdomen. This is somewhat more gas than I would typically expect on postoperative day 8 status post CO2 laparoscopy (conventionally, open surgical free air can last up to 10 days postoperatively but usually CO2 following laparoscopy is absorbed more quickly). This raises the possibility of a perforated viscus, but I do not see a specific site of perforation, and no spillage of the oral contrast into the abdominal cavity is observed. 2. Diffuse colon wall thickening along with some mild wall thickening of the terminal ileum. Appearance favors colitis. Some degree of colitis was also shown on the prior CT scan from 07/16/2022. I do not see any pneumatosis, portal venous gas, or abnormal narrowing/filling defect in the superior mesenteric artery or its branches to indicate a specific vascular/ischemic cause. The IMA and celiac trunk likewise appear patent. 3. 110 cc fluid collection tracking below the right hepatic lobe with enhancing margins, cannot exclude abscess or biloma. 4. Small type 1 hiatal hernia. 5. Subsegmental atelectasis in the posterior basal segment right lower lobe. 6. Mild aortoiliac atherosclerosis. Aortic Atherosclerosis (ICD10-I70.0). Radiology assistant personnel have been notified to put me in telephone contact with the referring physician or the referring physician's clinical representative in order to discuss these findings. Once this communication is established I will issue an addendum to this report for documentation purposes. Electronically Signed: By: Gaylyn Rong M.D. On: 01/02/2023 16:34    Labs:  CBC: Recent Labs    07/16/22 1147 12/04/22 0735 01/02/23 1857 01/03/23 0522  WBC 8.8 10.4 13.7* 11.2*  HGB 15.9 16.5 16.5 14.0  HCT 48.3 49.7 49.5 40.2  PLT 295 206 423* 297    COAGS: Recent Labs    07/16/22 1147  INR 1.0    BMP: Recent Labs    01/03/23 0039 01/03/23 0231 01/03/23 0522 01/03/23 0920  NA 135  135 135 136  K 3.6 3.5 3.2* 3.2*  CL 99 103 105 103  CO2 19* 21* 23 25  GLUCOSE 248* 198* 185* 162*  BUN 19 18 15 13   CALCIUM 9.4 8.7* 8.6* 8.3*  CREATININE 0.83 0.60* 0.49* 0.56*  GFRNONAA >  60 >60 >60 >60    LIVER FUNCTION TESTS: Recent Labs    06/30/22 1610 07/16/22 1147 12/04/22 0735 01/02/23 1857  BILITOT 0.8 1.5* 1.4* 2.1*  AST 16 12* 19 11*  ALT 16 12 23 12   ALKPHOS 94 98 90 129*  PROT 8.3* 7.4 7.7 8.1  ALBUMIN 4.3 3.9 4.4 3.6    TUMOR MARKERS: No results for input(s): "AFPTM", "CEA", "CA199", "CHROMGRNA" in the last 8760 hours.  Assessment and Plan: 57 year old male with history of chronic cholecystitis status post laparoscopic cholecystectomy and lysis of adhesions on 12/25/22 complicated post-operatively by subhepatic fluid collection concerning for abscess.    Plan for CT guided percutaneous drain placement with moderate sedation.    Electronically Signed: Bennie Dallas, MD 01/03/2023, 4:07 PM   I spent a total of 20 Minutes  in face to face in clinical consultation, greater than 50% of which was counseling/coordinating care for intraabdominal fluid collection concerning for abscess.

## 2023-01-03 NOTE — Assessment & Plan Note (Signed)
-   We will continue PPI therapy 

## 2023-01-03 NOTE — Progress Notes (Signed)
01/03/2023  Subjective: No acute events overnight.  Patient reports some discomfort in the right upper quadrant at the site of the fluid collection.  Denies any worsening pain.  His DKA is under better control.  Vital signs: Temp:  [97.6 F (36.4 C)-98.4 F (36.9 C)] 98.2 F (36.8 C) (05/18 1200) Pulse Rate:  [73-108] 82 (05/18 1200) Resp:  [17-26] 24 (05/18 1200) BP: (118-179)/(70-100) 138/76 (05/18 1200) SpO2:  [94 %-98 %] 96 % (05/18 1200) Weight:  [62.7 kg] 62.7 kg (05/18 0015)   Intake/Output: 05/17 0701 - 05/18 0700 In: 3260.4 [I.V.:946.6; IV Piggyback:2313.8] Out: 525 [Urine:525] Last BM Date :  (pta)  Physical Exam: Constitutional: No acute distress Abdomen: Soft, nondistended, with some mild tenderness to palpation in the right upper quadrant.  Incisions are clean, dry, intact.  No peritonitis still.  Labs:  Recent Labs    01/02/23 1857 01/03/23 0522  WBC 13.7* 11.2*  HGB 16.5 14.0  HCT 49.5 40.2  PLT 423* 297   Recent Labs    01/03/23 0522 01/03/23 0920  NA 135 136  K 3.2* 3.2*  CL 105 103  CO2 23 25  GLUCOSE 185* 162*  BUN 15 13  CREATININE 0.49* 0.56*  CALCIUM 8.6* 8.3*   No results for input(s): "LABPROT", "INR" in the last 72 hours.  Imaging: NM HEPATOBILIARY LEAK (POST-SURGICAL)  Result Date: 01/03/2023 CLINICAL DATA:  Postop from cholecystectomy. Abdominal pain and abnormal abdominal fluid collection on recent CT. Evaluate for postop bile leak. EXAM: NUCLEAR MEDICINE HEPATOBILIARY IMAGING TECHNIQUE: Sequential images of the abdomen were obtained out to 60 minutes following intravenous administration of radiopharmaceutical. RADIOPHARMACEUTICALS:  7.2 mCi Tc-67m  Choletec IV COMPARISON:  None Available. FINDINGS: Prompt uptake and biliary excretion of activity by the liver is seen. No gallbladder activity is visualized, consistent with prior cholecystectomy. Biliary activity passes into small bowel, consistent with patent common bile duct. No  extravasation of biliary activity is seen on this study. IMPRESSION: Prior cholecystectomy.  No evidence of postop bile leak. Electronically Signed   By: Danae Orleans M.D.   On: 01/03/2023 13:28   CT Abdomen Pelvis W Contrast  Addendum Date: 01/02/2023   ADDENDUM REPORT: 01/02/2023 17:20 ADDENDUM: The original report was by Dr. Gaylyn Rong. The following addendum is by Dr. Gaylyn Rong: Radiology personnel were unable to reach Dr. Everlene Farrier by telephone, possibly related to being after hours. We were directed to Dr. Aleen Campi who is on-call, but was in surgery. I discussed the case briefly with Dr. Aleen Campi from the operating room, and he directed [place] to have the patient proceed to the emergency room. Accordingly, I called Mr. Fornoff at 5:05 p.m. and after discussing some of the findings on the CT scan he agreed to proceed to the [facility] emergency room for further assessment. I telephoned the emergency department and spoke to attending physician Dr. Derrill Kay. We discussed the case in brief so that he would be oriented to the patient. We also left a voicemail on Dr. Hurman Horn phone indicating this series of events along with the patient's contact information. Electronically Signed   By: Gaylyn Rong M.D.   On: 01/02/2023 17:20   Result Date: 01/02/2023 CLINICAL DATA:  Postoperative abdominal pain. Cholecystectomy eight days ago. Weight loss. Reduced appetite. EXAM: CT ABDOMEN AND PELVIS WITH CONTRAST TECHNIQUE: Multidetector CT imaging of the abdomen and pelvis was performed using the standard protocol following bolus administration of intravenous contrast. RADIATION DOSE REDUCTION: This exam was performed according to the  departmental dose-optimization program which includes automated exposure control, adjustment of the mA and/or kV according to patient size and/or use of iterative reconstruction technique. CONTRAST:  80mL OMNIPAQUE IOHEXOL 300 MG/ML  SOLN COMPARISON:  07/16/2022  and ultrasound 12/04/2022 FINDINGS: Lower chest: Subsegmental atelectasis in the posterior basal segment right lower lobe. Hepatobiliary: Trace fluid along the gallbladder fossa as can commonly be encountered at this stage status post cholecystectomy. However, there is additional fluid tracking below the right hepatic lobe with enhancing margins measuring up to about 11.3 by 4.5 by 4.0 cm (volume = 110 cm^3), cannot exclude abscess or biloma. There is no gas in this collection. Mild focal steatosis in the liver adjacent to the falciform ligament. No biliary dilatation. Pancreas: Unremarkable Spleen: Unremarkable Adrenals/Urinary Tract: Unremarkable Stomach/Bowel: Small type 1 hiatal hernia. Diffuse colon wall thickening along with some mild wall thickening of the terminal ileum. The rectal wall does not appear thickened. Vascular/Lymphatic: Primarily soft plaque in the abdominal aorta and iliac vessels without substantial narrowing or specific abnormality in the proximal celiac trunk or SMA. I do not see any portal venous gas. Reproductive: Unremarkable Other: There is a moderate amount of scattered free intraperitoneal gas primarily along the liver edge and in the upper abdomen. This is somewhat more gas than I would typically expect on postoperative day 8 status post CO2 laparoscopy. This raises the possibility of a perforated viscus, but I do not see a specific site of perforation, and no spillage of the oral contrast into the abdominal cavity is observed, in particular I see no definite spilled contrast in the vicinity of the descending duodenum closest to the residual fluid along the gallbladder fossa. Musculoskeletal: Unremarkable IMPRESSION: 1. Moderate amount of scattered free intraperitoneal gas primarily along the liver edge and in the upper abdomen. This is somewhat more gas than I would typically expect on postoperative day 8 status post CO2 laparoscopy (conventionally, open surgical free air can last up  to 10 days postoperatively but usually CO2 following laparoscopy is absorbed more quickly). This raises the possibility of a perforated viscus, but I do not see a specific site of perforation, and no spillage of the oral contrast into the abdominal cavity is observed. 2. Diffuse colon wall thickening along with some mild wall thickening of the terminal ileum. Appearance favors colitis. Some degree of colitis was also shown on the prior CT scan from 07/16/2022. I do not see any pneumatosis, portal venous gas, or abnormal narrowing/filling defect in the superior mesenteric artery or its branches to indicate a specific vascular/ischemic cause. The IMA and celiac trunk likewise appear patent. 3. 110 cc fluid collection tracking below the right hepatic lobe with enhancing margins, cannot exclude abscess or biloma. 4. Small type 1 hiatal hernia. 5. Subsegmental atelectasis in the posterior basal segment right lower lobe. 6. Mild aortoiliac atherosclerosis. Aortic Atherosclerosis (ICD10-I70.0). Radiology assistant personnel have been notified to put me in telephone contact with the referring physician or the referring physician's clinical representative in order to discuss these findings. Once this communication is established I will issue an addendum to this report for documentation purposes. Electronically Signed: By: Gaylyn Rong M.D. On: 01/02/2023 16:34    Assessment/Plan: This is a 57 y.o. male status post robotic assisted cholecystectomy, with right upper quadrant fluid collection and DKA.  - Patient had a HIDA scan this morning which was negative for any bile leak. - Discussed with Dr. Elby Showers with interventional radiology regarding the right upper quadrant fluid collection concerning for abscess.  He has agreed to proceed with percutaneous drainage of this today. - Patient will remain n.p.o. until drain is placed.  After drainage, he may start a clear liquid diet.   I spent 35 minutes dedicated to  the care of this patient on the date of this encounter to include pre-visit review of records, face-to-face time with the patient discussing diagnosis and management, and any post-visit coordination of care.  Howie Ill, MD [facility]

## 2023-01-03 NOTE — Progress Notes (Signed)
[facility]  - [place]   PATIENT NAME: Justin Adkins    MR#:  [id number]  DATE OF BIRTH:  February 21, 1966  SUBJECTIVE:   No family at bedside. Patient had a cholecystectomy done on May 9. Came in with epigastric pain and nausea. No family at bedside. He was found to be DKA. Started on IV insulin drip sugars better and anion gap closed. Patient seen by general surgery. Awaiting HIDA    scan once off insulin gtt   VITALS:  Blood pressure 138/76, pulse 82, temperature 98.2 F (36.8 C), temperature source Oral, resp. rate (!) 24, height 5\' 8"  (1.727 m), weight 62.7 kg, SpO2 96 %.  PHYSICAL EXAMINATION:   GENERAL:  57 y.o.-year-old patient with no acute distress.  LUNGS: Normal breath sounds bilaterally, no wheezing CARDIOVASCULAR: S1, S2 normal. No murmur   ABDOMEN: Soft, nontender, nondistended. Bowel sounds present.  EXTREMITIES: No  edema b/l.    NEUROLOGIC: nonfocal  patient is alert and awake SKIN: No obvious rash, lesion, or ulcer.   LABORATORY PANEL:  CBC Recent Labs  Lab 01/03/23 0522  WBC 11.2*  HGB 14.0  HCT 40.2  PLT 297    Chemistries  Recent Labs  Lab 01/02/23 1857 01/03/23 0039 01/03/23 0920  NA 129*   < > 136  K 4.1   < > 3.2*  CL 94*   < > 103  CO2 11*   < > 25  GLUCOSE 366*   < > 162*  BUN 23*   < > 13  CREATININE 0.94   < > 0.56*  CALCIUM 9.4   < > 8.3*  MG 1.8  --   --   AST 11*  --   --   ALT 12  --   --   ALKPHOS 129*  --   --   BILITOT 2.1*  --   --    < > = values in this interval not displayed.   Cardiac Enzymes No results for input(s): "TROPONINI" in the last 168 hours. RADIOLOGY:  CT Abdomen Pelvis W Contrast  Addendum Date: 01/02/2023   ADDENDUM REPORT: 01/02/2023 17:20 ADDENDUM: The original report was by Dr. Gaylyn Rong. The following addendum is by Dr. Gaylyn Rong: Radiology personnel were unable to reach Dr. Everlene Farrier by telephone, possibly related to being after hours. We were directed to Dr.  Aleen Campi who is on-call, but was in surgery. I discussed the case briefly with Dr. Aleen Campi from the operating room, and he directed [place] to have the patient proceed to the emergency room. Accordingly, I called Mr. Proia at 5:05 p.m. and after discussing some of the findings on the CT scan he agreed to proceed to the [facility] emergency room for further assessment. I telephoned the emergency department and spoke to attending physician Dr. Derrill Kay. We discussed the case in brief so that he would be oriented to the patient. We also left a voicemail on Dr. Hurman Horn phone indicating this series of events along with the patient's contact information. Electronically Signed   By: Gaylyn Rong M.D.   On: 01/02/2023 17:20   Result Date: 01/02/2023 CLINICAL DATA:  Postoperative abdominal pain. Cholecystectomy eight days ago. Weight loss. Reduced appetite. EXAM: CT ABDOMEN AND PELVIS WITH CONTRAST TECHNIQUE: Multidetector CT imaging of the abdomen and pelvis was performed using the standard protocol following bolus administration of intravenous contrast. RADIATION DOSE REDUCTION: This exam was performed according to the departmental dose-optimization program which includes automated exposure  control, adjustment of the mA and/or kV according to patient size and/or use of iterative reconstruction technique. CONTRAST:  80mL OMNIPAQUE IOHEXOL 300 MG/ML  SOLN COMPARISON:  07/16/2022 and ultrasound 12/04/2022 FINDINGS: Lower chest: Subsegmental atelectasis in the posterior basal segment right lower lobe. Hepatobiliary: Trace fluid along the gallbladder fossa as can commonly be encountered at this stage status post cholecystectomy. However, there is additional fluid tracking below the right hepatic lobe with enhancing margins measuring up to about 11.3 by 4.5 by 4.0 cm (volume = 110 cm^3), cannot exclude abscess or biloma. There is no gas in this collection. Mild focal steatosis in the liver adjacent to the  falciform ligament. No biliary dilatation. Pancreas: Unremarkable Spleen: Unremarkable Adrenals/Urinary Tract: Unremarkable Stomach/Bowel: Small type 1 hiatal hernia. Diffuse colon wall thickening along with some mild wall thickening of the terminal ileum. The rectal wall does not appear thickened. Vascular/Lymphatic: Primarily soft plaque in the abdominal aorta and iliac vessels without substantial narrowing or specific abnormality in the proximal celiac trunk or SMA. I do not see any portal venous gas. Reproductive: Unremarkable Other: There is a moderate amount of scattered free intraperitoneal gas primarily along the liver edge and in the upper abdomen. This is somewhat more gas than I would typically expect on postoperative day 8 status post CO2 laparoscopy. This raises the possibility of a perforated viscus, but I do not see a specific site of perforation, and no spillage of the oral contrast into the abdominal cavity is observed, in particular I see no definite spilled contrast in the vicinity of the descending duodenum closest to the residual fluid along the gallbladder fossa. Musculoskeletal: Unremarkable IMPRESSION: 1. Moderate amount of scattered free intraperitoneal gas primarily along the liver edge and in the upper abdomen. This is somewhat more gas than I would typically expect on postoperative day 8 status post CO2 laparoscopy (conventionally, open surgical free air can last up to 10 days postoperatively but usually CO2 following laparoscopy is absorbed more quickly). This raises the possibility of a perforated viscus, but I do not see a specific site of perforation, and no spillage of the oral contrast into the abdominal cavity is observed. 2. Diffuse colon wall thickening along with some mild wall thickening of the terminal ileum. Appearance favors colitis. Some degree of colitis was also shown on the prior CT scan from 07/16/2022. I do not see any pneumatosis, portal venous gas, or abnormal  narrowing/filling defect in the superior mesenteric artery or its branches to indicate a specific vascular/ischemic cause. The IMA and celiac trunk likewise appear patent. 3. 110 cc fluid collection tracking below the right hepatic lobe with enhancing margins, cannot exclude abscess or biloma. 4. Small type 1 hiatal hernia. 5. Subsegmental atelectasis in the posterior basal segment right lower lobe. 6. Mild aortoiliac atherosclerosis. Aortic Atherosclerosis (ICD10-I70.0). Radiology assistant personnel have been notified to put me in telephone contact with the referring physician or the referring physician's clinical representative in order to discuss these findings. Once this communication is established I will issue an addendum to this report for documentation purposes. Electronically Signed: By: Gaylyn Rong M.D. On: 01/02/2023 16:34    Assessment and Plan  VIRL DONSON is a 57 y.o. male with medical history significant for, type diabetes mellitus, hypertension, dyslipidemia, peripheral vascular disease, ulcerative colitis and GERD who underwent recent laparoscopic cholecystectomy on 4/9 by Dr. Everlene Farrier and was seen today in follow-up.  He has been having residual abdominal pain and given concern about increased intraperitoneal air and  abdominal pelvic CT scan was ordered.   Abdominal pelvic CT scan revealed the following: 1. Moderate amount of scattered free intraperitoneal gas primarily along the liver edge and in the upper abdomen. This is somewhat more gas than I would typically expect on postoperative day 8 status post CO2 laparoscopy (conventionally, open surgical free air can last up to 10 days postoperatively but usually CO2 following laparoscopy is absorbed more quickly). This raises the possibility of a perforated viscus, but I do not see a specific site of perforation, and no spillage of the oral contrast into the abdominal cavity is observed. 2. Diffuse colon wall thickening along  with some mild wall thickening of the terminal ileum. Appearance favors colitis. Some degree of colitis was also shown on the prior CT scan from 07/16/2022. I do not see any pneumatosis, portal venous gas, or abnormal narrowing/filling defect in the superior mesenteric artery or its branches to indicate a specific vascular/ischemic cause. The IMA and celiac trunk likewise appear patent. 3. 110 cc fluid collection tracking below the right hepatic lobe with enhancing margins, cannot exclude abscess or biloma. 4. Small type 1 hiatal hernia. 5. Subsegmental atelectasis in the posterior basal segment right lower lobe. 6. Mild aortoiliac atherosclerosis.   DKA, type 2 (HCC) - received IV insulin drip per [employer] DKA protocol. - hydrated with IV lactated ringer. - AG closed--sugars stable --change to Semglee 15 units qd and SSI for now --pt [facility] with [facility]   Abnormal abdominal CT scan Suspected Right Upper quadrant abscess post cholecystectomy -  concerning for right upper abdominal abscess versus biloma. - Dr. Aleen Campi consulted - HIDA scan negative for bile leak. --IR to place drain right UQ - cont  IV Zosyn and holding off anticoagulation and Lovenox for potential procedure whether IR drainage  - Pain management will be provided. --no fever -- WBC 13.7--11.2 --lactic acid normal   Hyponatremia - pseudohyponatremia from DKA. -Resolved now since sugars are better   Dyslipidemia - cont statins   GERD without esophagitis - PPI    Ulcerative colitis (HCC) -  continue mesalamine.  PVD --per Dr Marijean Heath note on f/u --05/2019 s/p right 05/2019 s/p PTA R popliteal/AT, mech thrombectomy to R popliteal/tibioperoneal trunk--> plavix & eliquis 2.5 bid since; 09/2019 LE u/s: near nl study. --holding plavix and eliquis  due to scheduled IR procedure      DVT prophylaxis: SCDs Advanced Care Planning:  Code Status: full code.  Procedures:HIDA scan Family communication  :none today Consults :Gen surgery, IR CODE STATUS: FULL DVT Prophylaxis : Level of care: Stepdown Status is: Inpatient Remains inpatient appropriate because: right UQ abcess    TOTAL TIME TAKING CARE OF THIS PATIENT: 35 minutes.  >50% time spent on counselling and coordination of care  Note: This dictation was prepared with Dragon dictation along with smaller phrase technology. Any transcriptional errors that result from this process are unintentional.  Enedina Finner M.D    [facility]   CC: Primary care physician; [facility]

## 2023-01-03 NOTE — Inpatient Diabetes Management (Signed)
Inpatient Diabetes Program Recommendations  AACE/ADA: New Consensus Statement on Inpatient Glycemic Control (2015)  Target Ranges:  Prepandial:   less than 140 mg/dL      Peak postprandial:   less than 180 mg/dL (1-2 hours)      Critically ill patients:  140 - 180 mg/dL   Lab Results  Component Value Date   GLUCAP 244 (H) 12/25/2022   HGBA1C 9.8 (H) 01/03/2023    Review of Glycemic Control  Diabetes history: type 2 Outpatient Diabetes medications: Novolin N 40 units BID, Farxiga 10 mg daily, Ozempic 1 mg weekly Current orders for Inpatient glycemic control: Semglee 15 units daily, Novolog 0-9 units correction scale TID, Novolog 0-5 units HS scale  Inpatient Diabetes Program Recommendations:   Noted that patient has been on IV insulin since admission. Beta hydroxy on admission was 6.30. Transitioning off IV insulin now. Beta hydroxy is 0.37 at 0500 today.   If blood sugars continue to be elevated, recommend increasing Semglee to 15 units BID (may need to titrate dosage), Novolog 0-9 units correction scale every 4 hours while NPO.   Will continue to monitor blood sugars while in the hospital.  Smith Mince RN BSN CDE Diabetes Coordinator Pager: [phone]  8am-5pm

## 2023-01-04 DIAGNOSIS — K651 Peritoneal abscess: Secondary | ICD-10-CM

## 2023-01-04 DIAGNOSIS — E785 Hyperlipidemia, unspecified: Secondary | ICD-10-CM | POA: Diagnosis not present

## 2023-01-04 DIAGNOSIS — R188 Other ascites: Secondary | ICD-10-CM | POA: Diagnosis not present

## 2023-01-04 DIAGNOSIS — K668 Other specified disorders of peritoneum: Secondary | ICD-10-CM | POA: Diagnosis not present

## 2023-01-04 DIAGNOSIS — E118 Type 2 diabetes mellitus with unspecified complications: Secondary | ICD-10-CM

## 2023-01-04 DIAGNOSIS — K219 Gastro-esophageal reflux disease without esophagitis: Secondary | ICD-10-CM | POA: Diagnosis not present

## 2023-01-04 LAB — BASIC METABOLIC PANEL
Anion gap: 10 (ref 5–15)
BUN: 10 mg/dL (ref 6–20)
CO2: 26 mmol/L (ref 22–32)
Calcium: 8.4 mg/dL — ABNORMAL LOW (ref 8.9–10.3)
Chloride: 101 mmol/L (ref 98–111)
Creatinine, Ser: 0.53 mg/dL — ABNORMAL LOW (ref 0.61–1.24)
GFR, Estimated: >60 mL/min (ref >60)
Glucose, Bld: 209 mg/dL — ABNORMAL HIGH (ref 70–99)
Potassium: 3.2 mmol/L — ABNORMAL LOW (ref 3.5–5.1)
Sodium: 137 mmol/L (ref 135–145)

## 2023-01-04 LAB — GLUCOSE, CAPILLARY
Glucose-Capillary: 263 mg/dL — ABNORMAL HIGH (ref 70–99)
Glucose-Capillary: 217 mg/dL — ABNORMAL HIGH (ref 70–99)
Glucose-Capillary: 162 mg/dL — ABNORMAL HIGH (ref 70–99)

## 2023-01-04 LAB — MRSA NEXT GEN BY PCR, NASAL: MRSA by PCR Next Gen: NOT DETECTED

## 2023-01-04 MED ORDER — INSULIN GLARGINE-YFGN 100 UNIT/ML ~~LOC~~ SOLN
20.0000 [IU] | Freq: Every day | SUBCUTANEOUS | Status: DC
  Administered 2023-01-04 – 2023-01-05 (×2): 20 [IU] via SUBCUTANEOUS
  Filled 2023-01-04 (×2): qty 0.2

## 2023-01-04 MED ORDER — CLOPIDOGREL BISULFATE 75 MG PO TABS
75.0000 mg | ORAL_TABLET | Freq: Every day | ORAL | Status: DC
  Administered 2023-01-04 – 2023-01-06 (×3): 75 mg via ORAL
  Filled 2023-01-04 (×3): qty 1

## 2023-01-04 MED ORDER — STERILE WATER FOR INJECTION IJ SOLN
INTRAMUSCULAR | Status: AC
  Administered 2023-01-04: 10 mL via INTRAVENOUS
  Filled 2023-01-04: qty 10

## 2023-01-04 MED ORDER — RIVAROXABAN 2.5 MG PO TABS
2.5000 mg | ORAL_TABLET | Freq: Two times a day (BID) | ORAL | Status: DC
  Administered 2023-01-04 – 2023-01-06 (×5): 2.5 mg via ORAL
  Filled 2023-01-04 (×5): qty 1

## 2023-01-04 MED ORDER — INSULIN ASPART 100 UNIT/ML IJ SOLN
0.0000 [IU] | Freq: Three times a day (TID) | INTRAMUSCULAR | Status: DC
  Administered 2023-01-04: 5 [IU] via SUBCUTANEOUS
  Administered 2023-01-04 – 2023-01-05 (×3): 8 [IU] via SUBCUTANEOUS
  Administered 2023-01-05 – 2023-01-06 (×2): 3 [IU] via SUBCUTANEOUS
  Filled 2023-01-04 (×6): qty 1

## 2023-01-04 MED ORDER — INSULIN ASPART 100 UNIT/ML IJ SOLN: 0.0000 [IU] | Freq: Every day | INTRAMUSCULAR | Status: DC

## 2023-01-04 MED ORDER — POTASSIUM CHLORIDE 20 MEQ PO PACK
40.0000 meq | PACK | ORAL | Status: AC
  Administered 2023-01-04 (×2): 40 meq via ORAL
  Filled 2023-01-04 (×2): qty 2

## 2023-01-04 NOTE — Progress Notes (Signed)
[facility]  - [place] at [facility]   PATIENT NAME: Justin Adkins    MR#:  [id number]  DATE OF BIRTH:  22-Mar-1966  SUBJECTIVE:   No family at bedside. Patient had a cholecystectomy done on May 9. Came in with epigastric pain and nausea. No family at bedside. -s/p Right UQ drain placement by IR--no pain, no fever. Advanced Diet to  carb control. VITALS:  Blood pressure (!) 140/75, pulse 87, temperature 97.8 F (36.6 C), temperature source Oral, resp. rate 20, height 5\' 8"  (1.727 m), weight 62.7 kg, SpO2 94 %.  PHYSICAL EXAMINATION:   GENERAL:  57 y.o.-year-old patient with no acute distress.  LUNGS: Normal breath sounds bilaterally, no wheezing CARDIOVASCULAR: S1, S2 normal. No murmur   ABDOMEN: Soft, nontender, nondistended. Bowel sounds present. Right UQ Drain+ EXTREMITIES: No  edema b/l.    NEUROLOGIC: nonfocal  patient is alert and awake SKIN: No obvious rash, lesion, or ulcer.   LABORATORY PANEL:  CBC Recent Labs  Lab 01/03/23 0522  WBC 11.2*  HGB 14.0  HCT 40.2  PLT 297     Chemistries  Recent Labs  Lab 01/02/23 1857 01/03/23 0039 01/04/23 0424  NA 129*   < > 137  K 4.1   < > 3.2*  CL 94*   < > 101  CO2 11*   < > 26  GLUCOSE 366*   < > 209*  BUN 23*   < > 10  CREATININE 0.94   < > 0.53*  CALCIUM 9.4   < > 8.4*  MG 1.8  --   --   AST 11*  --   --   ALT 12  --   --   ALKPHOS 129*  --   --   BILITOT 2.1*  --   --    < > = values in this interval not displayed.    Cardiac Enzymes No results for input(s): "TROPONINI" in the last 168 hours. RADIOLOGY:  CT GUIDED PERITONEAL/RETROPERITONEAL FLUID DRAIN BY PERC CATH  Result Date: 01/04/2023 INDICATION: 57 year old male with postoperative right upper quadrant fluid collection concerning for abscess after laparoscopic cholecystectomy. EXAM: CT PERC DRAIN PERITONEAL ABCESS COMPARISON:  None Available. MEDICATIONS: The patient is currently admitted to the hospital and receiving intravenous  antibiotics. The antibiotics were administered within an appropriate time frame prior to the initiation of the procedure. ANESTHESIA/SEDATION: Moderate (conscious) sedation was employed during this procedure. A total of Versed 1 mg and Fentanyl 50 mcg was administered intravenously. Moderate Sedation Time: 17 minutes. The patient's level of consciousness and vital signs were monitored continuously by radiology nursing throughout the procedure under my direct supervision. CONTRAST:  None COMPLICATIONS: None immediate. PROCEDURE: RADIATION DOSE REDUCTION: This exam was performed according to the departmental dose-optimization program which includes automated exposure control, adjustment of the mA and/or kV according to patient size and/or use of iterative reconstruction technique. Informed written consent was obtained from the patient after a discussion of the risks, benefits and alternatives to treatment. The patient was placed left lateral decubitus on the CT gantry and a pre procedural CT was performed re-demonstrating the known abscess/fluid collection within the gallbladder fossa. The procedure was planned. A timeout was performed prior to the initiation of the procedure. The right mid axillary region was prepped and draped in the usual sterile fashion. The overlying soft tissues were anesthetized with 1% lidocaine with epinephrine. Appropriate trajectory was planned with the use of a 22 gauge spinal needle. An 18 gauge trocar  needle was advanced into the abscess/fluid collection and a short Amplatz super stiff wire was coiled within the collection. Appropriate positioning was confirmed with a limited CT scan. The tract was serially dilated allowing placement of a 10 [place] all-purpose drainage catheter. Appropriate positioning was confirmed with a limited postprocedural CT scan. Proximally 30 ml of serosanguineous fluid was aspirated. The tube was connected to a bulb suction and sutured in place. A dressing was  placed. The patient tolerated the procedure well without immediate post procedural complication. IMPRESSION: Successful CT guided placement of a 10 French all purpose drain catheter into the subhepatic fluid collection with aspiration of 30 mL of serosanguineous fluid. Samples were sent to the laboratory as requested by the ordering clinical team. Marliss Coots, MD [facility] Radiology Electronically Signed   By: Marliss Coots M.D.   On: 01/04/2023 07:41   NM HEPATOBILIARY LEAK (POST-SURGICAL)  Result Date: 01/03/2023 CLINICAL DATA:  Postop from cholecystectomy. Abdominal pain and abnormal abdominal fluid collection on recent CT. Evaluate for postop bile leak. EXAM: NUCLEAR MEDICINE HEPATOBILIARY IMAGING TECHNIQUE: Sequential images of the abdomen were obtained out to 60 minutes following intravenous administration of radiopharmaceutical. RADIOPHARMACEUTICALS:  7.2 mCi Tc-53m  Choletec IV COMPARISON:  None Available. FINDINGS: Prompt uptake and biliary excretion of activity by the liver is seen. No gallbladder activity is visualized, consistent with prior cholecystectomy. Biliary activity passes into small bowel, consistent with patent common bile duct. No extravasation of biliary activity is seen on this study. IMPRESSION: Prior cholecystectomy.  No evidence of postop bile leak. Electronically Signed   By: Danae Orleans M.D.   On: 01/03/2023 13:28   CT Abdomen Pelvis W Contrast  Addendum Date: 01/02/2023   ADDENDUM REPORT: 01/02/2023 17:20 ADDENDUM: The original report was by Dr. Gaylyn Rong. The following addendum is by Dr. Gaylyn Rong: Radiology personnel were unable to reach Dr. Everlene Farrier by telephone, possibly related to being after hours. We were directed to Dr. Aleen Campi who is on-call, but was in surgery. I discussed the case briefly with Dr. Aleen Campi from the operating room, and he directed [place] to have the patient proceed to the emergency room.  Accordingly, I called Mr. Roulhac at 5:05 p.m. and after discussing some of the findings on the CT scan he agreed to proceed to the [facility] emergency room for further assessment. I telephoned the emergency department and spoke to attending physician Dr. Derrill Kay. We discussed the case in brief so that he would be oriented to the patient. We also left a voicemail on Dr. Hurman Horn phone indicating this series of events along with the patient's contact information. Electronically Signed   By: Gaylyn Rong M.D.   On: 01/02/2023 17:20   Result Date: 01/02/2023 CLINICAL DATA:  Postoperative abdominal pain. Cholecystectomy eight days ago. Weight loss. Reduced appetite. EXAM: CT ABDOMEN AND PELVIS WITH CONTRAST TECHNIQUE: Multidetector CT imaging of the abdomen and pelvis was performed using the standard protocol following bolus administration of intravenous contrast. RADIATION DOSE REDUCTION: This exam was performed according to the departmental dose-optimization program which includes automated exposure control, adjustment of the mA and/or kV according to patient size and/or use of iterative reconstruction technique. CONTRAST:  80mL OMNIPAQUE IOHEXOL 300 MG/ML  SOLN COMPARISON:  07/16/2022 and ultrasound 12/04/2022 FINDINGS: Lower chest: Subsegmental atelectasis in the posterior basal segment right lower lobe. Hepatobiliary: Trace fluid along the gallbladder fossa as can commonly be encountered at this stage status post cholecystectomy. However, there is additional fluid  tracking below the right hepatic lobe with enhancing margins measuring up to about 11.3 by 4.5 by 4.0 cm (volume = 110 cm^3), cannot exclude abscess or biloma. There is no gas in this collection. Mild focal steatosis in the liver adjacent to the falciform ligament. No biliary dilatation. Pancreas: Unremarkable Spleen: Unremarkable Adrenals/Urinary Tract: Unremarkable Stomach/Bowel: Small type 1 hiatal hernia. Diffuse colon wall  thickening along with some mild wall thickening of the terminal ileum. The rectal wall does not appear thickened. Vascular/Lymphatic: Primarily soft plaque in the abdominal aorta and iliac vessels without substantial narrowing or specific abnormality in the proximal celiac trunk or SMA. I do not see any portal venous gas. Reproductive: Unremarkable Other: There is a moderate amount of scattered free intraperitoneal gas primarily along the liver edge and in the upper abdomen. This is somewhat more gas than I would typically expect on postoperative day 8 status post CO2 laparoscopy. This raises the possibility of a perforated viscus, but I do not see a specific site of perforation, and no spillage of the oral contrast into the abdominal cavity is observed, in particular I see no definite spilled contrast in the vicinity of the descending duodenum closest to the residual fluid along the gallbladder fossa. Musculoskeletal: Unremarkable IMPRESSION: 1. Moderate amount of scattered free intraperitoneal gas primarily along the liver edge and in the upper abdomen. This is somewhat more gas than I would typically expect on postoperative day 8 status post CO2 laparoscopy (conventionally, open surgical free air can last up to 10 days postoperatively but usually CO2 following laparoscopy is absorbed more quickly). This raises the possibility of a perforated viscus, but I do not see a specific site of perforation, and no spillage of the oral contrast into the abdominal cavity is observed. 2. Diffuse colon wall thickening along with some mild wall thickening of the terminal ileum. Appearance favors colitis. Some degree of colitis was also shown on the prior CT scan from 07/16/2022. I do not see any pneumatosis, portal venous gas, or abnormal narrowing/filling defect in the superior mesenteric artery or its branches to indicate a specific vascular/ischemic cause. The IMA and celiac trunk likewise appear patent. 3. 110 cc fluid  collection tracking below the right hepatic lobe with enhancing margins, cannot exclude abscess or biloma. 4. Small type 1 hiatal hernia. 5. Subsegmental atelectasis in the posterior basal segment right lower lobe. 6. Mild aortoiliac atherosclerosis. Aortic Atherosclerosis (ICD10-I70.0). Radiology assistant personnel have been notified to put me in telephone contact with the referring physician or the referring physician's clinical representative in order to discuss these findings. Once this communication is established I will issue an addendum to this report for documentation purposes. Electronically Signed: By: Gaylyn Rong M.D. On: 01/02/2023 16:34    Assessment and Plan  SAMIT SISTI is a 57 y.o. male with medical history significant for, type diabetes mellitus, hypertension, dyslipidemia, peripheral vascular disease, ulcerative colitis and GERD who underwent recent laparoscopic cholecystectomy on 4/9 by Dr. Everlene Farrier and was seen today in follow-up.  He has been having residual abdominal pain and given concern about increased intraperitoneal air and abdominal pelvic CT scan was ordered.   Abdominal pelvic CT scan revealed the following: 1. Moderate amount of scattered free intraperitoneal gas primarily along the liver edge and in the upper abdomen. This is somewhat more gas than I would typically expect on postoperative day 8 status post CO2 laparoscopy (conventionally, open surgical free air can last up to 10 days postoperatively but usually CO2 following laparoscopy  is absorbed more quickly). This raises the possibility of a perforated viscus, but I do not see a specific site of perforation, and no spillage of the oral contrast into the abdominal cavity is observed. 2. Diffuse colon wall thickening along with some mild wall thickening of the terminal ileum. Appearance favors colitis. Some degree of colitis was also shown on the prior CT scan from 07/16/2022. I do not see any pneumatosis,  portal venous gas, or abnormal narrowing/filling defect in the superior mesenteric artery or its branches to indicate a specific vascular/ischemic cause. The IMA and celiac trunk likewise appear patent. 3. 110 cc fluid collection tracking below the right hepatic lobe with enhancing margins, cannot exclude abscess or biloma. 4. Small type 1 hiatal hernia. 5. Subsegmental atelectasis in the posterior basal segment right lower lobe. 6. Mild aortoiliac atherosclerosis.   DKA, type 2 (HCC) - received IV insulin drip per [employer] DKA protocol. - hydrated with IV lactated ringer. - AG closed--sugars stable --change to Semglee 20 units qd and SSI for now --pt follows with [facility] endocrinology   Abnormal abdominal CT scan Right Upper quadrant abscess post cholecystectomy -  concerning for right upper abdominal abscess versus biloma. - Dr. Aleen Campi consulted - HIDA scan negative for bile leak. --IR to place drain right UQ - cont  IV Zosyn - Pain management will be provided. --no fever -- WBC 13.7--11.2 --lactic acid normal --s/p Right UQ drain placement by IR on 01/03/23 --f/u Fluid culture--gram positive cocci--consulted ID on call for help with abxs   Hyponatremia - pseudohyponatremia from DKA. -Resolved now since sugars are better   Dyslipidemia - cont statins   GERD without esophagitis - PPI    Ulcerative colitis (HCC) -  continue mesalamine.  PVD --per Dr Marijean Heath note on f/u --05/2019 s/p right 05/2019 s/p PTA R popliteal/AT, mech thrombectomy to R popliteal/tibioperoneal trunk--> plavix & eliquis 2.5 bid since; 09/2019 LE u/s: near nl study. --holding plavix and xarelto due to scheduled IR procedure --d/w dr Tyrone Sage to resume blood thinners      DVT prophylaxis: Plavix and Xarelto Advanced Care Planning:  Code Status: full code.  Procedures:HIDA scan Family communication :none today Consults :Gen surgery, IR CODE STATUS: FULL DVT Prophylaxis : Level of care:  Med-Surg Status is: Inpatient Remains inpatient appropriate because: right UQ abcess    TOTAL TIME TAKING CARE OF THIS PATIENT: 35 minutes.  >50% time spent on counselling and coordination of care  Note: This dictation was prepared with Dragon dictation along with smaller phrase technology. Any transcriptional errors that result from this process are unintentional.  Enedina Finner M.D    [facility]   CC: Primary care physician; [facility]

## 2023-01-04 NOTE — Progress Notes (Signed)
01/04/2023  Subjective: No acute events overnight.  Patient had a HIDA scan yesterday which was negative for bile leak.  Discussed with Dr. Elby Showers with interventional radiology and he was also able to place a percutaneous drain in the right upper quadrant fluid collection.  Initial aspiration of this showed serosanguineous fluid but cultures currently showing few gram-positive cocci.  The patient reports that he is feeling much better today.  Denies any significant or worsening pain particular in the epigastric or right upper quadrant areas.  He has been tolerating a clear liquid diet which was advanced to a diabetic diet this morning.  Vital signs: Temp:  [97.6 F (36.4 C)-98.2 F (36.8 C)] 97.8 F (36.6 C) (05/19 0741) Pulse Rate:  [74-102] 87 (05/19 0720) Resp:  [14-25] 20 (05/19 0750) BP: (97-150)/(62-90) 140/75 (05/19 0750) SpO2:  [93 %-99 %] 94 % (05/19 0720)   Intake/Output: 05/18 0701 - 05/19 0700 In: 1771.7 [I.V.:1524.4; IV Piggyback:247.4] Out: 1075 [Urine:1050; Drains:25] Last BM Date : 01/01/23  Physical Exam: Constitutional: No acute distress Abdomen: Soft, nondistended, with mild soreness to palpation in the upper abdomen.  No peritonitis.  Incisions are clean, dry, intact.  Right upper quadrant/flank drain with serosanguineous fluid.  Labs:  Recent Labs    01/02/23 1857 01/03/23 0522  WBC 13.7* 11.2*  HGB 16.5 14.0  HCT 49.5 40.2  PLT 423* 297   Recent Labs    01/03/23 0920 01/04/23 0424  NA 136 137  K 3.2* 3.2*  CL 103 101  CO2 25 26  GLUCOSE 162* 209*  BUN 13 10  CREATININE 0.56* 0.53*  CALCIUM 8.3* 8.4*   No results for input(s): "LABPROT", "INR" in the last 72 hours.  Imaging: CT GUIDED PERITONEAL/RETROPERITONEAL FLUID DRAIN BY PERC CATH  Result Date: 01/04/2023 INDICATION: 57 year old male with postoperative right upper quadrant fluid collection concerning for abscess after laparoscopic cholecystectomy. EXAM: CT PERC DRAIN PERITONEAL ABCESS  COMPARISON:  None Available. MEDICATIONS: The patient is currently admitted to the hospital and receiving intravenous antibiotics. The antibiotics were administered within an appropriate time frame prior to the initiation of the procedure. ANESTHESIA/SEDATION: Moderate (conscious) sedation was employed during this procedure. A total of Versed 1 mg and Fentanyl 50 mcg was administered intravenously. Moderate Sedation Time: 17 minutes. The patient's level of consciousness and vital signs were monitored continuously by radiology nursing throughout the procedure under my direct supervision. CONTRAST:  None COMPLICATIONS: None immediate. PROCEDURE: RADIATION DOSE REDUCTION: This exam was performed according to the departmental dose-optimization program which includes automated exposure control, adjustment of the mA and/or kV according to patient size and/or use of iterative reconstruction technique. Informed written consent was obtained from the patient after a discussion of the risks, benefits and alternatives to treatment. The patient was placed left lateral decubitus on the CT gantry and a pre procedural CT was performed re-demonstrating the known abscess/fluid collection within the gallbladder fossa. The procedure was planned. A timeout was performed prior to the initiation of the procedure. The right mid axillary region was prepped and draped in the usual sterile fashion. The overlying soft tissues were anesthetized with 1% lidocaine with epinephrine. Appropriate trajectory was planned with the use of a 22 gauge spinal needle. An 18 gauge trocar needle was advanced into the abscess/fluid collection and a short Amplatz super stiff wire was coiled within the collection. Appropriate positioning was confirmed with a limited CT scan. The tract was serially dilated allowing placement of a 10 [place] all-purpose drainage catheter. Appropriate positioning was confirmed  with a limited postprocedural CT scan. Proximally 30 ml  of serosanguineous fluid was aspirated. The tube was connected to a bulb suction and sutured in place. A dressing was placed. The patient tolerated the procedure well without immediate post procedural complication. IMPRESSION: Successful CT guided placement of a 10 French all purpose drain catheter into the subhepatic fluid collection with aspiration of 30 mL of serosanguineous fluid. Samples were sent to the laboratory as requested by the ordering clinical team. Marliss Coots, MD [facility] Radiology Electronically Signed   By: Marliss Coots M.D.   On: 01/04/2023 07:41   NM HEPATOBILIARY LEAK (POST-SURGICAL)  Result Date: 01/03/2023 CLINICAL DATA:  Postop from cholecystectomy. Abdominal pain and abnormal abdominal fluid collection on recent CT. Evaluate for postop bile leak. EXAM: NUCLEAR MEDICINE HEPATOBILIARY IMAGING TECHNIQUE: Sequential images of the abdomen were obtained out to 60 minutes following intravenous administration of radiopharmaceutical. RADIOPHARMACEUTICALS:  7.2 mCi Tc-75m  Choletec IV COMPARISON:  None Available. FINDINGS: Prompt uptake and biliary excretion of activity by the liver is seen. No gallbladder activity is visualized, consistent with prior cholecystectomy. Biliary activity passes into small bowel, consistent with patent common bile duct. No extravasation of biliary activity is seen on this study. IMPRESSION: Prior cholecystectomy.  No evidence of postop bile leak. Electronically Signed   By: Danae Orleans M.D.   On: 01/03/2023 13:28    Assessment/Plan: This is a 57 y.o. male with abdominal pain or right upper quadrant fluid collection.  - Patient's pain has been improving and today he reports that he is feeling much better.  Drain currently has serosanguineous fluid for culture showing few GPC.  Will await culture data.  For now it is okay to continue advancing diet to a diabetic diet this morning.  Continue working on diabetes  control. - Would anticipate being able to discharge tomorrow.   I spent 35 minutes dedicated to the care of this patient on the date of this encounter to include pre-visit review of records, face-to-face time with the patient discussing diagnosis and management, and any post-visit coordination of care.  Howie Ill, MD [facility]

## 2023-01-04 NOTE — Consult Note (Signed)
[facility]    Date of Admission:  01/02/2023     Reason for Consult: intra-abd abscess    Referring Provider: Allena Katz       Abx: 5/17-c piptazo        Assessment: 57 yo male with dm2, pvd, UC, gerd,  s/p cholecystectomy 5/9 admitted 5/17 for abd pain/nausea found to have an abscess in the gall bladder fossa  She is s/p IR perc drain 5/18; cx gpc She is improving on empiric piptazo Hida scan showed no biliary leak/obstruction   Given no leak, and sourc control with hida scan, and no active bacteremia, it is reasonable to transition to oral abx on discharge  Plan: F/u culture Continue piptazo If culture showed organism susceptible to amox-clav then could transition to that 875 mg po bid, and give 2 week course on hand Id f/u arranged with me on 5/30 @ 330 at [facility] Follow up surgery per their discretion Discussed with primary team   I spent more than 15 minute reviewing data/chart, and coordinating care,discussing diagnostics/treatment plan with treatment team   ------------------------------------------------ Principal Problem:   DKA, type 2 (HCC) Active Problems:   Abnormal abdominal CT scan   Ulcerative colitis (HCC)   GERD without esophagitis   Dyslipidemia   Hyponatremia   Pneumoperitoneum   Abdominal fluid collection    HPI: Justin Adkins is a 57 y.o. male post lap chole 5/9 here with abd pain found to have abscess at gall bladder fossa   Ct/hida reviewed No biliary leak S/p ir perc drain placement 5/18; cx growing gpc On piptazo Stable/improving  Surgery following no intervention planned      Family History  Problem Relation Age of Onset   Cancer Father    Heart attack Brother        MI @ 39    Social History   Tobacco Use   Smoking status: Former    Packs/day: 1.00    Years: 35.00    Additional pack years: 0.00    Total pack years: 35.00    Types: Cigarettes    Quit date: 2021     Years since quitting: 3.3    Passive exposure: Past   Smokeless tobacco: Never  Vaping Use   Vaping Use: Never used  Substance Use Topics   Alcohol use: Yes    Comment: rarely   Drug use: No    Allergies  Allergen Reactions   Naproxen Anaphylaxis   Liraglutide Nausea Only    (Victoza)    Review of Systems: ROS All Other ROS was negative, except mentioned above   Past Medical History:  Diagnosis Date   Atherosclerosis of native arteries of extremity with intermittent claudication (HCC)    ED (erectile dysfunction)    Elevated liver enzymes    GERD (gastroesophageal reflux disease)    History of echocardiogram    a. 07/2020 Echo: EF 60-65%, no rwma, mild LVH. Nl RV fxn. Mildly dil LA.   History of tobacco abuse    Hyperlipidemia    Hypertension    Peripheral vascular disease (HCC)    a. 05/2019 s/p PTA R popliteal/AT, mech thrombectomy to R popliteal/tibioperoneal trunk--> plavix & eliquis 2.5 bid since; 09/2019 LE u/s: near nl study.   Popliteal artery occlusion, right (HCC)    Type 2 diabetes mellitus (HCC)    Ulcerative colitis (HCC)        Scheduled Meds:  atorvastatin  20  mg Oral Daily   Chlorhexidine Gluconate Cloth  6 each Topical Daily   clopidogrel  75 mg Oral Daily   insulin aspart  0-15 Units Subcutaneous TID WC   insulin aspart  0-5 Units Subcutaneous QHS   insulin glargine-yfgn  20 Units Subcutaneous Daily   mesalamine  4.8 g Oral Q breakfast   pantoprazole (PROTONIX) IV  40 mg Intravenous Q24H   potassium chloride  40 mEq Oral Q4H   rivaroxaban  2.5 mg Oral BID   sodium chloride flush  5 mL Intracatheter Q8H   Continuous Infusions:  piperacillin-tazobactam (ZOSYN)  IV 12.5 mL/hr at 01/04/23 0743   PRN Meds:.acetaminophen **OR** acetaminophen, dextrose, docusate sodium, magnesium hydroxide, ondansetron **OR** ondansetron (ZOFRAN) IV, mouth rinse, oxyCODONE, traMADol, traZODone   OBJECTIVE: Blood pressure (!) 140/75, pulse 87, temperature 97.8  F (36.6 C), temperature source Oral, resp. rate 20, height 5\' 8"  (1.727 m), weight 62.7 kg, SpO2 94 %.  Physical Exam  Teleconsult Chart reviewed  Lab Results Lab Results  Component Value Date   WBC 11.2 (H) 01/03/2023   HGB 14.0 01/03/2023   HCT 40.2 01/03/2023   MCV 87.0 01/03/2023   PLT 297 01/03/2023    Lab Results  Component Value Date   CREATININE 0.53 (L) 01/04/2023   BUN 10 01/04/2023   NA 137 01/04/2023   K 3.2 (L) 01/04/2023   CL 101 01/04/2023   CO2 26 01/04/2023    Lab Results  Component Value Date   ALT 12 01/02/2023   AST 11 (L) 01/02/2023   ALKPHOS 129 (H) 01/02/2023   BILITOT 2.1 (H) 01/02/2023      Microbiology: Recent Results (from the past 240 hour(s))  MRSA Next Gen by PCR, Nasal     Status: None   Collection Time: 01/03/23  1:07 AM   Specimen: Nasal Mucosa; Nasal Swab  Result Value Ref Range Status   MRSA by PCR Next Gen NOT DETECTED NOT DETECTED Final    Comment: (NOTE) The GeneXpert MRSA Assay (FDA approved for NASAL specimens only), is one component of a comprehensive MRSA colonization surveillance program. It is not intended to diagnose MRSA infection nor to guide or monitor treatment for MRSA infections. Test performance is not FDA approved in patients less than 38 years old. Performed at [facility], [address] [place], [place] [address]   Aerobic/Anaerobic Culture w Gram Stain (surgical/deep wound)     Status: None (Preliminary result)   Collection Time: 01/03/23  4:35 PM   Specimen: Abdomen; Abscess  Result Value Ref Range Status   Specimen Description ABDOMEN ABSCESS  Final   Special Requests SYRINGE  Final   Gram Stain   Final    MODERATE WBC PRESENT,BOTH PMN AND MONONUCLEAR FEW GRAM POSITIVE COCCI Performed at [facility], [address], [place], [place] [address]    Culture PENDING  Incomplete   Report Status PENDING  Incomplete     Serology:    Imaging: If present, new imagings (plain  films, ct scans, and mri) have been personally visualized and interpreted; radiology reports have been reviewed. Decision making incorporated into the Impression / Recommendations.  5/17 abd pelv ct with contrast FINDINGS: Lower chest: Subsegmental atelectasis in the posterior basal segment right lower lobe.   Hepatobiliary: Trace fluid along the gallbladder fossa as can commonly be encountered at this stage status post cholecystectomy. However, there is additional fluid tracking below the right hepatic lobe with enhancing margins measuring up to about 11.3 by  4.5 by 4.0 cm (volume = 110 cm^3), cannot exclude abscess or biloma. There is no gas in this collection.   Mild focal steatosis in the liver adjacent to the falciform ligament.   No biliary dilatation.   Pancreas: Unremarkable   Spleen: Unremarkable   Adrenals/Urinary Tract: Unremarkable   Stomach/Bowel: Small type 1 hiatal hernia. Diffuse colon wall thickening along with some mild wall thickening of the terminal ileum. The rectal wall does not appear thickened.   Vascular/Lymphatic: Primarily soft plaque in the abdominal aorta and iliac vessels without substantial narrowing or specific abnormality in the proximal celiac trunk or SMA. I do not see any portal venous gas.   Reproductive: Unremarkable   Other: There is a moderate amount of scattered free intraperitoneal gas primarily along the liver edge and in the upper abdomen. This is somewhat more gas than I would typically expect on postoperative day 8 status post CO2 laparoscopy. This raises the possibility of a perforated viscus, but I do not see a specific site of perforation, and no spillage of the oral contrast into the abdominal cavity is observed, in particular I see no definite spilled contrast in the vicinity of the descending duodenum closest to the residual fluid along the gallbladder fossa.   Musculoskeletal: Unremarkable   IMPRESSION: 1. Moderate  amount of scattered free intraperitoneal gas primarily along the liver edge and in the upper abdomen. This is somewhat more gas than I would typically expect on postoperative day 8 status post CO2 laparoscopy (conventionally, open surgical free air can last up to 10 days postoperatively but usually CO2 following laparoscopy is absorbed more quickly). This raises the possibility of a perforated viscus, but I do not see a specific site of perforation, and no spillage of the oral contrast into the abdominal cavity is observed. 2. Diffuse colon wall thickening along with some mild wall thickening of the terminal ileum. Appearance favors colitis. Some degree of colitis was also shown on the prior CT scan from 07/16/2022. I do not see any pneumatosis, portal venous gas, or abnormal narrowing/filling defect in the superior mesenteric artery or its branches to indicate a specific vascular/ischemic cause. The IMA and celiac trunk likewise appear patent. 3. 110 cc fluid collection tracking below the right hepatic lobe with enhancing margins, cannot exclude abscess or biloma. 4. Small type 1 hiatal hernia. 5. Subsegmental atelectasis in the posterior basal segment right lower lobe. 6. Mild aortoiliac atherosclerosis.   5/18 hida FINDINGS: Prompt uptake and biliary excretion of activity by the liver is seen. No gallbladder activity is visualized, consistent with prior cholecystectomy.   Biliary activity passes into small bowel, consistent with patent common bile duct. No extravasation of biliary activity is seen on this study.   IMPRESSION: Prior cholecystectomy.  No evidence of postop bile leak.  Raymondo Band, MD [facility] [phone] pager    01/04/2023, 12:00 PM

## 2023-01-04 NOTE — Consult Note (Addendum)
PHARMACY CONSULT NOTE - ELECTROLYTES  Pharmacy Consult for Electrolyte Monitoring and Replacement   Recent Labs: Potassium (mmol/L)  Date Value  01/04/2023 3.2 (L)   Magnesium (mg/dL)  Date Value  40/98/1191 1.8   Calcium (mg/dL)  Date Value  47/82/9562 8.4 (L)   Albumin (g/dL)  Date Value  13/03/6577 3.6   Phosphorus (mg/dL)  Date Value  46/96/2952 3.5   Sodium (mmol/L)  Date Value  01/04/2023 137  09/10/2020 141   Corrected Ca: 8.7 mg/dL  Assessment  Justin Adkins is a 57 y.o. male presenting with abdominal pain. PMH significant for T2DM, UC, HLD, TUD, PVD, HTN. Pharmacy has been consulted to monitor and replace electrolytes.  Diet: NPO MIVF: LR d/cd  Goal of Therapy: Electrolytes WNL  Plan:  Give KCl 40 mEq PO x2 Check BMP with AM labs  Thank you for allowing pharmacy to be a part of this patient's care.  Bettey Costa, PharmD Clinical Pharmacist 01/04/2023 8:19 AM

## 2023-01-04 NOTE — Inpatient Diabetes Management (Signed)
Inpatient Diabetes Program Recommendations  AACE/ADA: New Consensus Statement on Inpatient Glycemic Control (2015)  Target Ranges:  Prepandial:   less than 140 mg/dL      Peak postprandial:   less than 180 mg/dL (1-2 hours)      Critically ill patients:  140 - 180 mg/dL   Lab Results  Component Value Date   GLUCAP 244 (H) 12/25/2022   HGBA1C 9.8 (H) 01/03/2023    Review of Glycemic Control  Diabetes history: type 2 Outpatient Diabetes medications: Novolin NPH 40 units BID, Farxiga 10 mg daily, Ozempic 1 mg weekly Current orders for Inpatient glycemic control: Semglee 20 units daily, Novolog 0-9 units correction scale TID, Novolog 0-5 units HS scale  Inpatient Diabetes Program Recommendations:   Spoke to patient on the phone. Diabetes coordinator is working remotely for the weekend call.   Patient states that he was diagnosed with diabetes in his mid 20's. He verified that he sees endocrinologist, Dr. Gershon Crane and will be following up with appointment next month. States that he does take NPH insulin 40 units BID at home. States that he only checks his blood sugars about twice a week. Encouraged him to check at least every day and keep a record. Noted that patient's blood sugars were elevated on admission because patient had not taken insulin for a couple of days.  Will continue to monitor blood sugars while in the hospital.  Smith Mince RN BSN CDE Diabetes Coordinator Pager: [phone]  8am-5pm

## 2023-01-05 ENCOUNTER — Telehealth (HOSPITAL_COMMUNITY): Payer: Self-pay

## 2023-01-05 ENCOUNTER — Other Ambulatory Visit (HOSPITAL_COMMUNITY): Payer: Self-pay

## 2023-01-05 DIAGNOSIS — T8143XA Infection following a procedure, organ and space surgical site, initial encounter: Secondary | ICD-10-CM | POA: Diagnosis not present

## 2023-01-05 DIAGNOSIS — E785 Hyperlipidemia, unspecified: Secondary | ICD-10-CM | POA: Diagnosis not present

## 2023-01-05 DIAGNOSIS — K219 Gastro-esophageal reflux disease without esophagitis: Secondary | ICD-10-CM | POA: Diagnosis not present

## 2023-01-05 DIAGNOSIS — K651 Peritoneal abscess: Secondary | ICD-10-CM | POA: Diagnosis not present

## 2023-01-05 DIAGNOSIS — E1110 Type 2 diabetes mellitus with ketoacidosis without coma: Secondary | ICD-10-CM | POA: Diagnosis not present

## 2023-01-05 LAB — GLUCOSE, CAPILLARY
Glucose-Capillary: 184 mg/dL — ABNORMAL HIGH (ref 70–99)
Glucose-Capillary: 115 mg/dL — ABNORMAL HIGH (ref 70–99)
Glucose-Capillary: 131 mg/dL — ABNORMAL HIGH (ref 70–99)
Glucose-Capillary: 147 mg/dL — ABNORMAL HIGH (ref 70–99)
Glucose-Capillary: 150 mg/dL — ABNORMAL HIGH (ref 70–99)
Glucose-Capillary: 164 mg/dL — ABNORMAL HIGH (ref 70–99)
Glucose-Capillary: 166 mg/dL — ABNORMAL HIGH (ref 70–99)
Glucose-Capillary: 170 mg/dL — ABNORMAL HIGH (ref 70–99)
Glucose-Capillary: 179 mg/dL — ABNORMAL HIGH (ref 70–99)
Glucose-Capillary: 187 mg/dL — ABNORMAL HIGH (ref 70–99)
Glucose-Capillary: 189 mg/dL — ABNORMAL HIGH (ref 70–99)
Glucose-Capillary: 190 mg/dL — ABNORMAL HIGH (ref 70–99)
Glucose-Capillary: 190 mg/dL — ABNORMAL HIGH (ref 70–99)
Glucose-Capillary: 191 mg/dL — ABNORMAL HIGH (ref 70–99)
Glucose-Capillary: 230 mg/dL — ABNORMAL HIGH (ref 70–99)
Glucose-Capillary: 258 mg/dL — ABNORMAL HIGH (ref 70–99)
Glucose-Capillary: 259 mg/dL — ABNORMAL HIGH (ref 70–99)
Glucose-Capillary: 291 mg/dL — ABNORMAL HIGH (ref 70–99)
Glucose-Capillary: 263 mg/dL — ABNORMAL HIGH (ref 70–99)
Glucose-Capillary: 165 mg/dL — ABNORMAL HIGH (ref 70–99)

## 2023-01-05 LAB — COMPREHENSIVE METABOLIC PANEL
ALT: 13 U/L (ref 0–44)
AST: 13 U/L — ABNORMAL LOW (ref 15–41)
Albumin: 2.6 g/dL — ABNORMAL LOW (ref 3.5–5.0)
Alkaline Phosphatase: 93 U/L (ref 38–126)
Anion gap: 10 (ref 5–15)
BUN: 9 mg/dL (ref 6–20)
CO2: 27 mmol/L (ref 22–32)
Calcium: 8.3 mg/dL — ABNORMAL LOW (ref 8.9–10.3)
Chloride: 103 mmol/L (ref 98–111)
Creatinine, Ser: 0.48 mg/dL — ABNORMAL LOW (ref 0.61–1.24)
GFR, Estimated: >60 mL/min (ref >60)
Glucose, Bld: 166 mg/dL — ABNORMAL HIGH (ref 70–99)
Potassium: 2.8 mmol/L — ABNORMAL LOW (ref 3.5–5.1)
Sodium: 140 mmol/L (ref 135–145)
Total Bilirubin: 0.8 mg/dL (ref 0.3–1.2)
Total Protein: 5.8 g/dL — ABNORMAL LOW (ref 6.5–8.1)

## 2023-01-05 LAB — CBC
HCT: 38.2 % — ABNORMAL LOW (ref 39.0–52.0)
Hemoglobin: 13.2 g/dL (ref 13.0–17.0)
MCH: 30.4 pg (ref 26.0–34.0)
MCHC: 34.6 g/dL (ref 30.0–36.0)
MCV: 88 fL (ref 80.0–100.0)
Platelets: 235 10*3/uL (ref 150–400)
RBC: 4.34 MIL/uL (ref 4.22–5.81)
RDW: 12.7 % (ref 11.5–15.5)
WBC: 6 10*3/uL (ref 4.0–10.5)
nRBC: 0 % (ref 0.0–0.2)

## 2023-01-05 MED ORDER — SODIUM CHLORIDE 0.9 % IV SOLN
3.0000 g | Freq: Four times a day (QID) | INTRAVENOUS | Status: DC
  Administered 2023-01-05 – 2023-01-06 (×3): 3 g via INTRAVENOUS
  Filled 2023-01-05 (×4): qty 8

## 2023-01-05 MED ORDER — POTASSIUM CHLORIDE 10 MEQ/100ML IV SOLN
10.0000 meq | INTRAVENOUS | Status: AC
  Administered 2023-01-05 (×4): 10 meq via INTRAVENOUS
  Filled 2023-01-05: qty 100

## 2023-01-05 MED ORDER — PIPERACILLIN-TAZOBACTAM 3.375 G IVPB
3.3750 g | Freq: Three times a day (TID) | INTRAVENOUS | Status: DC
  Administered 2023-01-05: 3.375 g via INTRAVENOUS
  Filled 2023-01-05: qty 50

## 2023-01-05 MED ORDER — MAGNESIUM SULFATE 2 GM/50ML IV SOLN
2.0000 g | Freq: Once | INTRAVENOUS | Status: AC
  Administered 2023-01-05: 2 g via INTRAVENOUS
  Filled 2023-01-05: qty 50

## 2023-01-05 MED ORDER — INSULIN GLARGINE-YFGN 100 UNIT/ML ~~LOC~~ SOLN
24.0000 [IU] | Freq: Every day | SUBCUTANEOUS | Status: DC
  Administered 2023-01-06: 24 [IU] via SUBCUTANEOUS
  Filled 2023-01-05: qty 0.24

## 2023-01-05 MED ORDER — POTASSIUM CHLORIDE CRYS ER 20 MEQ PO TBCR
40.0000 meq | EXTENDED_RELEASE_TABLET | Freq: Once | ORAL | Status: AC
  Administered 2023-01-05: 40 meq via ORAL
  Filled 2023-01-05: qty 2

## 2023-01-05 MED ORDER — SODIUM CHLORIDE 0.9 % IV SOLN
3.0000 g | Freq: Four times a day (QID) | INTRAVENOUS | Status: DC
  Filled 2023-01-05: qty 8

## 2023-01-05 MED ORDER — INSULIN ASPART 100 UNIT/ML IJ SOLN
5.0000 [IU] | Freq: Three times a day (TID) | INTRAMUSCULAR | Status: DC
  Administered 2023-01-05 – 2023-01-06 (×2): 5 [IU] via SUBCUTANEOUS
  Filled 2023-01-05 (×2): qty 1

## 2023-01-05 NOTE — Consult Note (Signed)
PHARMACY CONSULT NOTE - ELECTROLYTES  Pharmacy Consult for Electrolyte Monitoring and Replacement   Recent Labs: Potassium (mmol/L)  Date Value  01/05/2023 2.8 (L)   Magnesium (mg/dL)  Date Value  16/05/9603 1.8   Calcium (mg/dL)  Date Value  54/04/8118 8.3 (L)   Albumin (g/dL)  Date Value  14/78/2956 2.6 (L)   Phosphorus (mg/dL)  Date Value  21/30/8657 3.5   Sodium (mmol/L)  Date Value  01/05/2023 140  09/10/2020 141   Corrected Ca: 8.7 mg/dL  Assessment  Justin Adkins is a 57 y.o. male presenting with abdominal pain. PMH significant for T2DM, UC, HLD, TUD, PVD, HTN. Pharmacy has been consulted to monitor and replace electrolytes.  Diet: NPO MIVF: LR d/cd  Goal of Therapy: Electrolytes WNL  Plan:  KCL 10 mEq IV x 4 followed by KCL 40 mEq x 1 Mg 2 g IV x 1.  F/u with AM labs.   Thank you for allowing pharmacy to be a part of this patient's care.  Ronnald Ramp, PharmD Clinical Pharmacist 01/05/2023 7:00 AM

## 2023-01-05 NOTE — Inpatient Diabetes Management (Addendum)
Inpatient Diabetes Program Recommendations  AACE/ADA: New Consensus Statement on Inpatient Glycemic Control (2015)  Target Ranges:  Prepandial:   less than 140 mg/dL      Peak postprandial:   less than 180 mg/dL (1-2 hours)      Critically ill patients:  140 - 180 mg/dL   Lab Results  Component Value Date   GLUCAP 184 (H) 01/05/2023   HGBA1C 9.8 (H) 01/03/2023   Patient had last office visit with Dr. Gershon Crane on 10/16/22. Discussed with pt. On this visit concerns with off of insulin x 3 weeks due to pharmacy out of syringes. A1c was 10.8 @ this visit and currently 9.8.  Discussed plan of Dr. Allena Katz to place patient on Lantus + Novolog instead of prior 70/30 and patient agrees with plan and wants insulin pens instead of syringes ordered @ discharge.  Spoke with patient and patient is interested in a free style Libre CGM-order received for sensor to be placed prior to D/C.  Will continue to follow while hospitalized.  Thank you, Billy Fischer. Regena Delucchi, RN, MSN, CDE  Diabetes Coordinator Inpatient Glycemic Control Team Team Pager [phone] (8am-5pm) 01/05/2023 8:34 AM

## 2023-01-05 NOTE — Telephone Encounter (Signed)
Patient Advocate Encounter   Received notification from [employer] that prior authorization for [employer] CGM Supplies is required.   PA submitted on 01/05/2023 Key [id number] Insurance [employer] Status is pending

## 2023-01-05 NOTE — TOC Benefit Eligibility Note (Signed)
Patient Product/process development scientist completed.    The patient is currently admitted and upon discharge could be taking Lantus.  The current 30 day co-pay is $0.00.   The patient is insured through [employer]   This test claim was processed through [facility]- copay amounts may vary at other pharmacies due to pharmacy/plan contracts, or as the patient moves through the different stages of their insurance plan.

## 2023-01-05 NOTE — Telephone Encounter (Signed)
Pharmacy Patient Advocate Encounter  Insurance verification completed.    The patient is insured through [facility]   The patient is currently admitted and ran test claims for the following: Lantus.  Copays and coinsurance results were relayed to Inpatient clinical team.

## 2023-01-05 NOTE — Progress Notes (Signed)
[facility]  - [place] at [facility]   PATIENT NAME: Quinshawn Shong    MR#:  [id number]  DATE OF BIRTH:  1965/09/19  SUBJECTIVE:   2 brothers from [place] at bedside. Patient had a cholecystectomy done on May 9. Came in with epigastric pain and nausea. No family at bedside. -s/p Right UQ drain placement by IR--no pain, no fever. Advanced Diet to  carb control. Patient denies any complaints and wondering when he can go home. VITALS:  Blood pressure 107/60, pulse 85, temperature 97.7 F (36.5 C), resp. rate 17, height 5\' 8"  (1.727 m), weight 62.7 kg, SpO2 96 %.  PHYSICAL EXAMINATION:   GENERAL:  57 y.o.-year-old patient with no acute distress.  LUNGS: Normal breath sounds bilaterally, no wheezing CARDIOVASCULAR: S1, S2 normal. No murmur   ABDOMEN: Soft, nontender, nondistended. Bowel sounds present. Right UQ Drain+ EXTREMITIES: No  edema b/l.    NEUROLOGIC: nonfocal  patient is alert and awake SKIN: No obvious rash, lesion, or ulcer.   LABORATORY PANEL:  CBC Recent Labs  Lab 01/05/23 0542  WBC 6.0  HGB 13.2  HCT 38.2*  PLT 235     Chemistries  Recent Labs  Lab 01/02/23 1857 01/03/23 0039 01/05/23 0542  NA 129*   < > 140  K 4.1   < > 2.8*  CL 94*   < > 103  CO2 11*   < > 27  GLUCOSE 366*   < > 166*  BUN 23*   < > 9  CREATININE 0.94   < > 0.48*  CALCIUM 9.4   < > 8.3*  MG 1.8  --   --   AST 11*  --  13*  ALT 12  --  13  ALKPHOS 129*  --  93  BILITOT 2.1*  --  0.8   < > = values in this interval not displayed.    Cardiac Enzymes No results for input(s): "TROPONINI" in the last 168 hours. RADIOLOGY:  CT GUIDED PERITONEAL/RETROPERITONEAL FLUID DRAIN BY PERC CATH  Result Date: 01/04/2023 INDICATION: 57 year old male with postoperative right upper quadrant fluid collection concerning for abscess after laparoscopic cholecystectomy. EXAM: CT PERC DRAIN PERITONEAL ABCESS COMPARISON:  None Available. MEDICATIONS: The patient is currently admitted to the  hospital and receiving intravenous antibiotics. The antibiotics were administered within an appropriate time frame prior to the initiation of the procedure. ANESTHESIA/SEDATION: Moderate (conscious) sedation was employed during this procedure. A total of Versed 1 mg and Fentanyl 50 mcg was administered intravenously. Moderate Sedation Time: 17 minutes. The patient's level of consciousness and vital signs were monitored continuously by radiology nursing throughout the procedure under my direct supervision. CONTRAST:  None COMPLICATIONS: None immediate. PROCEDURE: RADIATION DOSE REDUCTION: This exam was performed according to the departmental dose-optimization program which includes automated exposure control, adjustment of the mA and/or kV according to patient size and/or use of iterative reconstruction technique. Informed written consent was obtained from the patient after a discussion of the risks, benefits and alternatives to treatment. The patient was placed left lateral decubitus on the CT gantry and a pre procedural CT was performed re-demonstrating the known abscess/fluid collection within the gallbladder fossa. The procedure was planned. A timeout was performed prior to the initiation of the procedure. The right mid axillary region was prepped and draped in the usual sterile fashion. The overlying soft tissues were anesthetized with 1% lidocaine with epinephrine. Appropriate trajectory was planned with the use of a 22 gauge spinal needle. An 18 gauge  trocar needle was advanced into the abscess/fluid collection and a short Amplatz super stiff wire was coiled within the collection. Appropriate positioning was confirmed with a limited CT scan. The tract was serially dilated allowing placement of a 10 [place] all-purpose drainage catheter. Appropriate positioning was confirmed with a limited postprocedural CT scan. Proximally 30 ml of serosanguineous fluid was aspirated. The tube was connected to a bulb suction  and sutured in place. A dressing was placed. The patient tolerated the procedure well without immediate post procedural complication. IMPRESSION: Successful CT guided placement of a 10 French all purpose drain catheter into the subhepatic fluid collection with aspiration of 30 mL of serosanguineous fluid. Samples were sent to the laboratory as requested by the ordering clinical team. Marliss Coots, MD [facility] Radiology Electronically Signed   By: Marliss Coots M.D.   On: 01/04/2023 07:41   NM HEPATOBILIARY LEAK (POST-SURGICAL)  Result Date: 01/03/2023 CLINICAL DATA:  Postop from cholecystectomy. Abdominal pain and abnormal abdominal fluid collection on recent CT. Evaluate for postop bile leak. EXAM: NUCLEAR MEDICINE HEPATOBILIARY IMAGING TECHNIQUE: Sequential images of the abdomen were obtained out to 60 minutes following intravenous administration of radiopharmaceutical. RADIOPHARMACEUTICALS:  7.2 mCi Tc-34m  Choletec IV COMPARISON:  None Available. FINDINGS: Prompt uptake and biliary excretion of activity by the liver is seen. No gallbladder activity is visualized, consistent with prior cholecystectomy. Biliary activity passes into small bowel, consistent with patent common bile duct. No extravasation of biliary activity is seen on this study. IMPRESSION: Prior cholecystectomy.  No evidence of postop bile leak. Electronically Signed   By: Danae Orleans M.D.   On: 01/03/2023 13:28    Assessment and Plan  EXZAVION ZELENAK is a 57 y.o. male with medical history significant for, type diabetes mellitus, hypertension, dyslipidemia, peripheral vascular disease, ulcerative colitis and GERD who underwent recent laparoscopic cholecystectomy on 4/9 by Dr. Everlene Farrier and was seen today in follow-up.  He has been having residual abdominal pain and given concern about increased intraperitoneal air and abdominal pelvic CT scan was ordered.   Abdominal pelvic CT scan revealed  the following: 1. Moderate amount of scattered free intraperitoneal gas primarily along the liver edge and in the upper abdomen. This is somewhat more gas than I would typically expect on postoperative day 8 status post CO2 laparoscopy (conventionally, open surgical free air can last up to 10 days postoperatively but usually CO2 following laparoscopy is absorbed more quickly). This raises the possibility of a perforated viscus, but I do not see a specific site of perforation, and no spillage of the oral contrast into the abdominal cavity is observed. 2. Diffuse colon wall thickening along with some mild wall thickening of the terminal ileum. Appearance favors colitis. Some degree of colitis was also shown on the prior CT scan from 07/16/2022. I do not see any pneumatosis, portal venous gas, or abnormal narrowing/filling defect in the superior mesenteric artery or its branches to indicate a specific vascular/ischemic cause. The IMA and celiac trunk likewise appear patent. 3. 110 cc fluid collection tracking below the right hepatic lobe with enhancing margins, cannot exclude abscess or biloma. 4. Small type 1 hiatal hernia. 5. Subsegmental atelectasis in the posterior basal segment right lower lobe. 6. Mild aortoiliac atherosclerosis.   DKA, type 2 (HCC) - received IV insulin drip per [employer] DKA protocol. - hydrated with IV lactated ringer. - AG closed--sugars stable --change to Semglee 24 units qd and SSI for now --[facility]  Abnormal abdominal CT scan Right Upper quadrant abscess post cholecystectomy -  concerning for right upper abdominal abscess versus biloma. - Dr. Aleen Campi consulted - HIDA scan negative for bile leak. --IR to place drain right UQ - cont  IV Zosyn - Pain management will be provided. --no fever -- WBC 13.7--11.2 --lactic acid normal --s/p Right UQ drain placement by IR on 01/03/23 --f/u Fluid culture--gram positive cocci--consulted  ID on call Dr Renold Don for help with abxs-- recommends continue current antibiotic and change to Augmentin for total of two weeks and follow-up as outpatient -- patient remains afebrile.   Hyponatremia - pseudohyponatremia from DKA. -Resolved now since sugars are better  Hypokalemia -- patient is getting IV NPO replacement.   Dyslipidemia - cont statins   GERD without esophagitis - PPI    Ulcerative colitis (HCC) -  continue mesalamine.  PVD --per Dr Marijean Heath note on f/u --05/2019 s/p right 05/2019 s/p PTA R popliteal/AT, mech thrombectomy to R popliteal/tibioperoneal trunk--> plavix & eliquis 2.5 bid since; 09/2019 LE u/s: near nl study. --holding plavix and xarelto due to scheduled IR procedure --d/w dr Tyrone Sage to resume blood thinners      DVT prophylaxis: Plavix and Xarelto Advanced Care Planning:  Code Status: full code.  Procedures:HIDA scan Family communication :none today Consults :Gen surgery, IR CODE STATUS: FULL DVT Prophylaxis : Level of care: Med-Surg Status is: Inpatient Remains inpatient appropriate because: right UQ abcess    TOTAL TIME TAKING CARE OF THIS PATIENT: 35 minutes.  >50% time spent on counselling and coordination of care  Note: This dictation was prepared with Dragon dictation along with smaller phrase technology. Any transcriptional errors that result from this process are unintentional.  Enedina Finner M.D    [facility]   CC: Primary care physician; [facility]

## 2023-01-05 NOTE — Progress Notes (Signed)
  Transition of Care [facility]) Screening Note   Patient Details  Name: Justin Adkins Date of Birth: 1966-06-11   Transition of Care [facility]) CM/SW Contact:    Garret Reddish, RN Phone Number: 01/05/2023, 12:24 PM    Transition of Care Department [facility]) has reviewed patient and no TOC needs have been identified at this time. We will continue to monitor patient advancement through interdisciplinary progression rounds. If new patient transition needs arise, please place a TOC consult.  Chart reviewed.  Noted that patient had Cholecystectomy on 12-25-22.  Patient admitted due increased nausea and abdominal pain.  Noted that patient had Percutaneous drain on 01-03-2023.  Fluid cultures grown gram positive cocci. Patient on IV Zosyn.  I and D and surgery consulted to see patient.  Abdominal pelvic CT scan and Hida scan while in the hospital.  Noted that patient will be able to transition to po antibiotics on discharge.  Noted no biliary leak or obstruction.    Noted that Pharmacy has verified patient's insurance for Lantus on discharge.    No needs identified at this time.

## 2023-01-05 NOTE — Plan of Care (Signed)

## 2023-01-05 NOTE — Telephone Encounter (Signed)
Pharmacy Patient Advocate Encounter  Insurance verification completed.    The patient is insured through [facility]   The patient is currently admitted and ran test claims for the following: Freestyle CGM sensors, Dexcom G7 CGM sensors.  Copays and coinsurance results were relayed to Inpatient clinical team.

## 2023-01-05 NOTE — Consult Note (Addendum)
NAME: Justin Adkins  DOB: April 06, 1966  MRN: [id number]  Date/Time: 01/05/2023 12:27 PM  REQUESTING PROVIDER: Dr. Allena Katz Subjective:  REASON FOR CONSULT: gall bladder fossa abscess ? Justin Adkins is a 57 y.o. with a history of ulcerative colitis, HTN, DM, hyperlipidemia underwent lap cholecystectomy on 12/25/22 presented with abnormal CT abdomen and abdominal pain on 01/02/23.He had seen Dr.Pabon on 5/15 and he ordered CT abdomen which was done on  01/02/23 and showed a a collection near  rt hepatic lobe. HE was sent to ED by radiologist after talking to surgeon on call. HE had epigastric /rt upper quadrant pain, no radiation, some nausea but no vomiting, HE did not have any fever. In the Ed vitals were   01/02/23  BP 179/89 !  Temp 98.1 F (36.7 C)  Pulse Rate 94  Resp 19  SpO2 98 %    Latest Reference Range & Units 01/02/23  WBC 4.0 - 10.5 K/uL 13.7 (H)  Hemoglobin 13.0 - 17.0 g/dL 40.9  HCT 81.1 - 91.4 % 49.5  Platelets 150 - 400 K/uL 423 (H)  Creatinine 0.61 - 1.24 mg/dL 7.82   Pt started on pip/tazo. IR placed rt upper quadrant drain on 5/18 and culture of the fluid was sent ID was consulted over weekend and asked to continue IV until culture available Pt is feeling better now  Past Medical History:  Diagnosis Date   Atherosclerosis of native arteries of extremity with intermittent claudication [facility])    ED (erectile dysfunction)    Elevated liver enzymes    GERD (gastroesophageal reflux disease)    History of echocardiogram    a. 07/2020 Echo: EF 60-65%, no rwma, mild LVH. Nl RV fxn. Mildly dil LA.   History of tobacco abuse    Hyperlipidemia    Hypertension    Peripheral vascular disease (HCC)    a. 05/2019 s/p PTA R popliteal/AT, mech thrombectomy to R popliteal/tibioperoneal trunk--> plavix & eliquis 2.5 bid since; 09/2019 LE u/s: near nl study.   Popliteal artery occlusion, right (HCC)    Type 2 diabetes mellitus (HCC)    Ulcerative colitis [facility])     Past Surgical  History:  Procedure Laterality Date   COLONOSCOPY     COLONOSCOPY N/A 09/24/2020   Procedure: COLONOSCOPY;  Surgeon: Regis Bill, MD;  Location: [facility] ENDOSCOPY;  Service: Endoscopy;  Laterality: N/A;   COLONOSCOPY WITH PROPOFOL N/A 11/07/2022   Procedure: COLONOSCOPY WITH PROPOFOL;  Surgeon: Regis Bill, MD;  Location: [facility] ENDOSCOPY;  Service: Endoscopy;  Laterality: N/A;   ESOPHAGOGASTRODUODENOSCOPY (EGD) WITH PROPOFOL N/A 02/28/2020   Procedure: ESOPHAGOGASTRODUODENOSCOPY (EGD) WITH PROPOFOL;  Surgeon: Regis Bill, MD;  Location: [facility] ENDOSCOPY;  Service: Endoscopy;  Laterality: N/A;   FOOT SURGERY Right    HERNIA REPAIR     inguinal hernia   LOWER EXTREMITY ANGIOGRAPHY Right 05/25/2019   Procedure: LOWER EXTREMITY ANGIOGRAPHY;  Surgeon: Renford Dills, MD;  Location: [facility] INVASIVE CV LAB;  Service: Cardiovascular;  Laterality: Right;   LOWER EXTREMITY ANGIOGRAPHY Right 07/02/2021   Procedure: LOWER EXTREMITY ANGIOGRAPHY;  Surgeon: Renford Dills, MD;  Location: [facility] INVASIVE CV LAB;  Service: Cardiovascular;  Laterality: Right;   TONSILLECTOMY     VASCULAR SURGERY      Social History   Socioeconomic History   Marital status: Widowed    Spouse name: Not on file   Number of children: Not on file   Years of education: Not on file   Highest education level: Not  on file  Occupational History   Not on file  Tobacco Use   Smoking status: Former    Packs/day: 1.00    Years: 35.00    Additional pack years: 0.00    Total pack years: 35.00    Types: Cigarettes    Quit date: 2021    Years since quitting: 3.3    Passive exposure: Past   Smokeless tobacco: Never  Vaping Use   Vaping Use: Never used  Substance and Sexual Activity   Alcohol use: Yes    Comment: rarely   Drug use: No   Sexual activity: Yes    Birth control/protection: None  Other Topics Concern   Not on file  Social History Narrative   Lives in [place] w/ wife and son.  Does not  routinely exercise.   Social Determinants of Health   Financial Resource Strain: Not on file  Food Insecurity: No Food Insecurity (01/03/2023)   Hunger Vital Sign    Worried About Running Out of Food in the Last Year: Never true    Ran Out of Food in the Last Year: Never true  Transportation Needs: No Transportation Needs (01/03/2023)   PRAPARE - Administrator, Civil Service (Medical): No    Lack of Transportation (Non-Medical): No  Physical Activity: Not on file  Stress: Not on file  Social Connections: Not on file  Intimate Partner Violence: Not At Risk (01/03/2023)   Humiliation, Afraid, Rape, and Kick questionnaire    Fear of Current or Ex-Partner: No    Emotionally Abused: No    Physically Abused: No    Sexually Abused: No    Family History  Problem Relation Age of Onset   Cancer Father    Heart attack Brother        MI @ 17   Allergies  Allergen Reactions   Naproxen Anaphylaxis   Liraglutide Nausea Only    (Victoza)   I? Current Facility-Administered Medications  Medication Dose Route Frequency Provider Last Rate Last Admin   acetaminophen (TYLENOL) tablet 650 mg  650 mg Oral Q6H PRN Mansy, Jan A, MD   650 mg at 01/03/23 2005   Or   acetaminophen (TYLENOL) suppository 650 mg  650 mg Rectal Q6H PRN Mansy, Jan A, MD       atorvastatin (LIPITOR) tablet 20 mg  20 mg Oral Daily Mansy, Jan A, MD   20 mg at 01/05/23 9604   Chlorhexidine Gluconate Cloth 2 % PADS 6 each  6 each Topical Daily Mansy, Jan A, MD   6 each at 01/05/23 0941   clopidogrel (PLAVIX) tablet 75 mg  75 mg Oral Daily Enedina Finner, MD   75 mg at 01/05/23 0938   dextrose 50 % solution 0-50 mL  0-50 mL Intravenous PRN Georga Hacking, MD       docusate sodium (COLACE) capsule 100 mg  100 mg Oral BID PRN Mansy, Jan A, MD       insulin aspart (novoLOG) injection 0-15 Units  0-15 Units Subcutaneous TID WC Enedina Finner, MD   8 Units at 01/05/23 1158   insulin aspart (novoLOG) injection 0-5 Units  0-5  Units Subcutaneous QHS Enedina Finner, MD       insulin aspart (novoLOG) injection 5 Units  5 Units Subcutaneous TID WC Enedina Finner, MD       [START ON 01/06/2023] insulin glargine-yfgn (SEMGLEE) injection 24 Units  24 Units Subcutaneous Daily Enedina Finner, MD  magnesium hydroxide (MILK OF MAGNESIA) suspension 30 mL  30 mL Oral Daily PRN Mansy, Jan A, MD       mesalamine (LIALDA) EC tablet 4.8 g  4.8 g Oral Q breakfast Mansy, Jan A, MD   4.8 g at 01/05/23 0939   ondansetron (ZOFRAN) tablet 4 mg  4 mg Oral Q6H PRN Mansy, Jan A, MD       Or   ondansetron [facility]) injection 4 mg  4 mg Intravenous Q6H PRN Mansy, Vernetta Honey, MD       Oral care mouth rinse  15 mL Mouth Rinse PRN Mansy, Jan A, MD       oxyCODONE (Oxy IR/ROXICODONE) immediate release tablet 5 mg  5 mg Oral Q4H PRN Mansy, Jan A, MD       piperacillin-tazobactam (ZOSYN) IVPB 3.375 g  3.375 g Intravenous Q8H Otelia Sergeant, RPH 12.5 mL/hr at 01/05/23 0635 3.375 g at 01/05/23 1610   potassium chloride 10 mEq in 100 mL IVPB  10 mEq Intravenous Q1 Hr x 4 Ronnald Ramp, RPH 100 mL/hr at 01/05/23 1216 10 mEq at 01/05/23 1216   potassium chloride SA (KLOR-CON M) CR tablet 40 mEq  40 mEq Oral Once Ronnald Ramp, RPH       rivaroxaban Carlena Hurl) tablet 2.5 mg  2.5 mg Oral BID Enedina Finner, MD   2.5 mg at 01/05/23 9604   sodium chloride flush (NS) 0.9 % injection 5 mL  5 mL Intracatheter Q8H Suttle, Thressa Sheller, MD   5 mL at 01/05/23 0636   traMADol (ULTRAM) tablet 50 mg  50 mg Oral Q6H PRN Mansy, Jan A, MD       traZODone (DESYREL) tablet 25 mg  25 mg Oral QHS PRN Mansy, Vernetta Honey, MD         Abtx:  Anti-infectives (From admission, onward)    Start     Dose/Rate Route Frequency Ordered Stop   01/03/23 0600  piperacillin-tazobactam (ZOSYN) IVPB 3.375 g        3.375 g 12.5 mL/hr over 240 Minutes Intravenous Every 8 hours 01/02/23 2304     01/02/23 2115  piperacillin-tazobactam (ZOSYN) IVPB 3.375 g        3.375 g 100 mL/hr over 30 Minutes Intravenous   Once 01/02/23 2112 01/02/23 2317       REVIEW OF SYSTEMS:  Const: negative fever, negative chills, negative weight loss Eyes: negative diplopia or visual changes, negative eye pain ENT: negative coryza, negative sore throat Resp: negative cough, hemoptysis, dyspnea Cards: negative for chest pain, palpitations, lower extremity edema GU: negative for frequency, dysuria and hematuria GI: as above Skin: negative for rash and pruritus Heme: negative for easy bruising and gum/nose bleeding MS: negative for myalgias, arthralgias, back pain and muscle weakness Neurolo:negative for headaches, dizziness, vertigo, memory problems  Psych: negative for feelings of anxiety, depression  Endocrine:  diabetes Allergy/Immunology- naprosyn?  Objective:  VITALS:  BP 107/60 (BP Location: Left Arm)   Pulse 85   Temp 97.7 F (36.5 C)   Resp 17   Ht 5\' 8"  (1.727 m)   Wt 62.7 kg   SpO2 96%   BMI 21.02 kg/m  LDA Rt upper quadrant drain PHYSICAL EXAM:  General: Alert, cooperative, no distress, appears stated age.  Head: Normocephalic, without obvious abnormality, atraumatic. Eyes: Conjunctivae clear, anicteric sclerae. Pupils are equal ENT Nares normal. No drainage or sinus tenderness. Lips, mucosa, and tongue normal. No Thrush Neck: Supple, symmetrical, no adenopathy, thyroid: non tender  no carotid bruit and no JVD. Back: No CVA tenderness. Lungs: Clear to auscultation bilaterally. No Wheezing or Rhonchi. No rales. Heart: Regular rate and rhythm, no murmur, rub or gallop. Abdomen: Soft, non-tender,rt upper quadrant drain- bilious fluid Extremities: atraumatic, no cyanosis. No edema. No clubbing Skin: No rashes or lesions. Or bruising Lymph: Cervical, supraclavicular normal. Neurologic: Grossly non-focal Pertinent Labs Lab Results CBC    Component Value Date/Time   WBC 6.0 01/05/2023 0542   RBC 4.34 01/05/2023 0542   HGB 13.2 01/05/2023 0542   HCT 38.2 (L) 01/05/2023 0542   PLT 235  01/05/2023 0542   MCV 88.0 01/05/2023 0542   MCH 30.4 01/05/2023 0542   MCHC 34.6 01/05/2023 0542   RDW 12.7 01/05/2023 0542   LYMPHSABS 0.8 01/02/2023 1857   MONOABS 0.6 01/02/2023 1857   EOSABS 0.1 01/02/2023 1857   BASOSABS 0.1 01/02/2023 1857       Latest Ref Rng & Units 01/05/2023    5:42 AM 01/04/2023    4:24 AM 01/03/2023    9:20 AM  CMP  Glucose 70 - 99 mg/dL 161  096  045   BUN 6 - 20 mg/dL 9  10  13    Creatinine 0.61 - 1.24 mg/dL 4.09  8.11  9.14   Sodium 135 - 145 mmol/L 140  137  136   Potassium 3.5 - 5.1 mmol/L 2.8  3.2  3.2   Chloride 98 - 111 mmol/L 103  101  103   CO2 22 - 32 mmol/L 27  26  25    Calcium 8.9 - 10.3 mg/dL 8.3  8.4  8.3   Total Protein 6.5 - 8.1 g/dL 5.8     Total Bilirubin 0.3 - 1.2 mg/dL 0.8     Alkaline Phos 38 - 126 U/L 93     AST 15 - 41 U/L 13     ALT 0 - 44 U/L 13         Microbiology: Recent Results (from the past 240 hour(s))  MRSA Next Gen by PCR, Nasal     Status: None   Collection Time: 01/03/23  1:07 AM   Specimen: Nasal Mucosa; Nasal Swab  Result Value Ref Range Status   MRSA by PCR Next Gen NOT DETECTED NOT DETECTED Final    Comment: (NOTE) The GeneXpert MRSA Assay (FDA approved for NASAL specimens only), is one component of a comprehensive MRSA colonization surveillance program. It is not intended to diagnose MRSA infection nor to guide or monitor treatment for MRSA infections. Test performance is not FDA approved in patients less than 82 years old. Performed at [facility], [address] [place], [place] [address]   Aerobic/Anaerobic Culture w Gram Stain (surgical/deep wound)     Status: None (Preliminary result)   Collection Time: 01/03/23  4:35 PM   Specimen: Abdomen; Abscess  Result Value Ref Range Status   Specimen Description ABDOMEN ABSCESS  Final   Special Requests SYRINGE  Final   Gram Stain   Final    MODERATE WBC PRESENT,BOTH PMN AND MONONUCLEAR FEW GRAM POSITIVE COCCI    Culture   Final     CULTURE REINCUBATED FOR BETTER GROWTH Performed at [facility], [address], [place], [place] [address]    Report Status PENDING  Incomplete    IMAGING RESULTS:  I have personally reviewed the films Abscess adjacent to rt hepatic lobe   Impression/Recommendation Intraabdominal abscess / rt hepatic area /Gallbladder fossa s/p lap cholecystectomy Has a  rt upper quadrant drain On pip/tazo Culture strep sanguinis , will change to unasyn Recommend Ultrasound upper quadrant to look for decrease in fluid collection. If decreased then there is adequate source control and no GB stump leak and can then be switched to PO antibiotic  DM - poorly controlled on presentation- on insulin  HLD on statin  ? ? ___________________________________________________ Discussed with patient Note:  This document was prepared using Dragon voice recognition software and may include unintentional dictation errors.

## 2023-01-05 NOTE — TOC Benefit Eligibility Note (Signed)
Patient Product/process development scientist completed.    The patient is currently admitted and upon discharge could be taking Freestyle CGM Sensors.  The current 30 day co-pay is NA Dexcom preferred.   The patient is currently admitted and upon discharge could be taking Dexcom CGM Sensors.  The current 30 day co-pay is NA PA required  The patient is insured through [employer]   This test claim was processed through [facility]- copay amounts may vary at other pharmacies due to pharmacy/plan contracts, or as the patient moves through the different stages of their insurance plan.

## 2023-01-05 NOTE — Progress Notes (Signed)
Patient seen and examined.  He feels much better.  Tolerating diet.  No nausea no vomiting no fever.  Culture from drain was gram-positive's with streptococci.  PE NAD Abd: soft, nt incisions c/d/I drain serosanguinous fluid  A/P Doing well Complete oral a/bs Continue drain, we will f/u as outpt No need for surgical intervention

## 2023-01-06 ENCOUNTER — Other Ambulatory Visit (HOSPITAL_COMMUNITY): Payer: Self-pay

## 2023-01-06 DIAGNOSIS — E785 Hyperlipidemia, unspecified: Secondary | ICD-10-CM | POA: Diagnosis not present

## 2023-01-06 DIAGNOSIS — K219 Gastro-esophageal reflux disease without esophagitis: Secondary | ICD-10-CM | POA: Diagnosis not present

## 2023-01-06 DIAGNOSIS — K651 Peritoneal abscess: Secondary | ICD-10-CM | POA: Diagnosis not present

## 2023-01-06 DIAGNOSIS — E1110 Type 2 diabetes mellitus with ketoacidosis without coma: Secondary | ICD-10-CM | POA: Diagnosis not present

## 2023-01-06 LAB — BASIC METABOLIC PANEL WITH GFR
Anion gap: 9 (ref 5–15)
BUN: 8 mg/dL (ref 6–20)
CO2: 27 mmol/L (ref 22–32)
Calcium: 8.3 mg/dL — ABNORMAL LOW (ref 8.9–10.3)
Chloride: 104 mmol/L (ref 98–111)
Creatinine, Ser: 0.46 mg/dL — ABNORMAL LOW (ref 0.61–1.24)
GFR, Estimated: >60 mL/min (ref >60)
Glucose, Bld: 189 mg/dL — ABNORMAL HIGH (ref 70–99)
Potassium: 3.8 mmol/L (ref 3.5–5.1)
Sodium: 140 mmol/L (ref 135–145)

## 2023-01-06 LAB — AEROBIC/ANAEROBIC CULTURE W GRAM STAIN (SURGICAL/DEEP WOUND)

## 2023-01-06 LAB — MAGNESIUM: Magnesium: 2 mg/dL (ref 1.7–2.4)

## 2023-01-06 LAB — GLUCOSE, CAPILLARY: Glucose-Capillary: 163 mg/dL — ABNORMAL HIGH (ref 70–99)

## 2023-01-06 MED ORDER — AMOXICILLIN-POT CLAVULANATE 875-125 MG PO TABS: 1.0000 | ORAL_TABLET | Freq: Two times a day (BID) | ORAL | Status: DC

## 2023-01-06 MED ORDER — INSULIN GLARGINE 100 UNIT/ML SOLOSTAR PEN
25.0000 [IU] | PEN_INJECTOR | Freq: Every day | SUBCUTANEOUS | 3 refills | Status: AC
Start: 2023-01-06 — End: ?

## 2023-01-06 MED ORDER — LANCETS MISC
1.0000 | Freq: Three times a day (TID) | 0 refills | Status: AC
Start: 2023-01-06 — End: ?

## 2023-01-06 MED ORDER — PEN NEEDLES 31G X 5 MM MISC
1.0000 | Freq: Three times a day (TID) | 0 refills | Status: AC
Start: 2023-01-06 — End: ?

## 2023-01-06 MED ORDER — GVOKE HYPOPEN 2-PACK 1 MG/0.2ML ~~LOC~~ SOAJ
1.0000 mg | SUBCUTANEOUS | 2 refills | Status: AC | PRN
Start: 2023-01-06 — End: ?

## 2023-01-06 MED ORDER — BLOOD GLUCOSE MONITORING SUPPL DEVI
1.0000 | Freq: Three times a day (TID) | 0 refills | Status: AC
Start: 2023-01-06 — End: ?

## 2023-01-06 MED ORDER — POTASSIUM CHLORIDE CRYS ER 20 MEQ PO TBCR
40.0000 meq | EXTENDED_RELEASE_TABLET | Freq: Once | ORAL | Status: AC
  Administered 2023-01-06: 40 meq via ORAL
  Filled 2023-01-06: qty 2

## 2023-01-06 MED ORDER — LANCET DEVICE MISC
1.0000 | Freq: Three times a day (TID) | 0 refills | Status: AC
Start: 2023-01-06 — End: ?

## 2023-01-06 MED ORDER — BLOOD GLUCOSE TEST VI STRP: 1.0000 | ORAL_STRIP | Freq: Three times a day (TID) | 0 refills | Status: AC

## 2023-01-06 MED ORDER — INSULIN ASPART 100 UNIT/ML FLEXPEN
5.0000 [IU] | PEN_INJECTOR | Freq: Three times a day (TID) | SUBCUTANEOUS | 3 refills | Status: AC
Start: 2023-01-06 — End: ?

## 2023-01-06 MED ORDER — HYDROCODONE-ACETAMINOPHEN 5-325 MG PO TABS
1.0000 | ORAL_TABLET | Freq: Four times a day (QID) | ORAL | 0 refills | Status: DC | PRN
Start: 2023-01-06 — End: 2023-05-06

## 2023-01-06 MED ORDER — AMOXICILLIN-POT CLAVULANATE 875-125 MG PO TABS: 1.0000 | ORAL_TABLET | Freq: Two times a day (BID) | ORAL | 0 refills | Status: AC

## 2023-01-06 NOTE — Discharge Instructions (Signed)
Pt to f/u with Dr Everlene Farrier in 10-12 days Keep log of sugars at home for Dr Donald Prose to review F/u PCP in 1-2 weeks

## 2023-01-06 NOTE — Consult Note (Signed)
PHARMACY CONSULT NOTE - ELECTROLYTES  Pharmacy Consult for Electrolyte Monitoring and Replacement   Recent Labs: Potassium (mmol/L)  Date Value  01/06/2023 3.8   Magnesium (mg/dL)  Date Value  11/91/4782 2.0   Calcium (mg/dL)  Date Value  95/62/1308 8.3 (L)   Albumin (g/dL)  Date Value  65/78/4696 2.6 (L)   Phosphorus (mg/dL)  Date Value  29/52/8413 3.5   Sodium (mmol/L)  Date Value  01/06/2023 140  09/10/2020 141   Corrected Ca: 8.7 mg/dL  Assessment  Justin Adkins is a 57 y.o. male presenting with abdominal pain. PMH significant for T2DM, UC, HLD, TUD, PVD, HTN. Pharmacy has been consulted to monitor and replace electrolytes.  Diet: carb modified. Thin MIVF: LR d/cd  Goal of Therapy: Electrolytes WNL  Plan:  KCL 40 mEq x 1 F/u with AM labs.   Thank you for allowing pharmacy to be a part of this patient's care.  Ronnald Ramp, PharmD Clinical Pharmacist 01/06/2023 7:56 AM

## 2023-01-06 NOTE — Inpatient Diabetes Management (Signed)
Inpatient Diabetes Program Recommendations  AACE/ADA: New Consensus Statement on Inpatient Glycemic Control (2015)  Target Ranges:  Prepandial:   less than 140 mg/dL      Peak postprandial:   less than 180 mg/dL (1-2 hours)      Critically ill patients:  140 - 180 mg/dL   Lab Results  Component Value Date   GLUCAP 163 (H) 01/06/2023   HGBA1C 9.8 (H) 01/03/2023   Inpatient Diabetes Program Recommendations:   Reviewed with patient change in insulin regimen from 70/30 to Lantus + Novolog and explained differences in basal and meal coverage. Patient verbalized understanding.  MD ordered application of Dexcom G7 CGM at discharge for patient. Education done regarding application and changing CGM sensor (alternate every 10 days on back of arms), 1 hour warm-up, use of glucometer when alert displays, how to scan CGM for glucose reading and information for PCP.  Sensor applied by patient to (L) Arm at 1000am. Explained that glucose readings will not be available until 1 hour after application. Reviewed use of CGM including how to scan, changing Sensor, Vitamin C warning, arrows with glucose readings, and Dexcom G7 app.   Patient's phone did not connect well per app. To start sensor. Patient is trying when he is home on wifi and then to Dr. Elberta Fortis office if not working.  Thank you, Billy Fischer. Chezney Huether, RN, MSN, CDE  Diabetes Coordinator Inpatient Glycemic Control Team Team Pager [phone] (8am-5pm) 01/06/2023 10:16 AM

## 2023-01-06 NOTE — Discharge Summary (Signed)
Physician Discharge Summary   Patient: Justin Adkins MRN: [id number] DOB: 1966-06-19  Admit date:     01/02/2023  Discharge date: 01/06/23  Discharge Physician: Enedina Finner   PCP: [facility]   Recommendations at discharge:    F/u Dr Everlene Farrier for right uq abscess F/u Dr Gershon Crane in 1-2 weeks Keep log of your sugars at home  Discharge Diagnoses: Principal Problem:   DKA, type 2 (HCC) Active Problems:   Abnormal abdominal CT scan   Ulcerative colitis (HCC)   GERD without esophagitis   Dyslipidemia   Hyponatremia   Pneumoperitoneum   Abdominal fluid collection   Right upper quadrant abdominal abscess (HCC)  Justin Adkins is a 57 y.o. male with medical history significant for, type diabetes mellitus, hypertension, dyslipidemia, peripheral vascular disease, ulcerative colitis and GERD who underwent recent laparoscopic cholecystectomy on 4/9 by Dr. Everlene Farrier and was seen today in follow-up.  He has been having residual abdominal pain and given concern about increased intraperitoneal air and abdominal pelvic CT scan was ordered.    Abdominal pelvic CT scan revealed the following: 1. Moderate amount of scattered free intraperitoneal gas primarily along the liver edge and in the upper abdomen. This is somewhat more gas than I would typically expect on postoperative day 8 status post CO2 laparoscopy (conventionally, open surgical free air can last up to 10 days postoperatively but usually CO2 following laparoscopy is absorbed more quickly). This raises the possibility of a perforated viscus, but I do not see a specific site of perforation, and no spillage of the oral contrast into the abdominal cavity is observed. 2. Diffuse colon wall thickening along with some mild wall thickening of the terminal ileum. Appearance favors colitis. Some degree of colitis was also shown on the prior CT scan from 07/16/2022. I do not see any pneumatosis, portal venous gas, or abnormal  narrowing/filling defect in the superior mesenteric artery or its branches to indicate a specific vascular/ischemic cause. The IMA and celiac trunk likewise appear patent. 3. 110 cc fluid collection tracking below the right hepatic lobe with enhancing margins, cannot exclude abscess or biloma. 4. Small type 1 hiatal hernia. 5. Subsegmental atelectasis in the posterior basal segment right lower lobe. 6. Mild aortoiliac atherosclerosis.    DKA, type 2 (HCC) - received IV insulin drip per [facility] DKA protocol. - hydrated with IV lactated ringer. - AG closed--sugars stable --change to Semglee 24 units qd and SSI for now--changed to Lantus + novolog tid --pt follows with [facility] endocrinology   Abnormal abdominal CT scan Right Upper quadrant abscess post cholecystectomy -  concerning for right upper abdominal abscess versus biloma. - Dr. Aleen Campi consulted - HIDA scan negative for bile leak. --IR to place drain right UQ - cont  IV Zosyn - Pain management will be provided. --no fever -- WBC 13.7--11.2 --lactic acid normal --s/p Right UQ drain placement by IR on 01/03/23 --f/u Fluid culture--gram positive cocci--consulted ID on call Dr Renold Don for help with abxs-- recommends continue current antibiotic and change to Augmentin for total of two weeks  -- patient remains afebrile. --pt will f/u Dr Everlene Farrier and he will take care of the drain as out pt. Pt agreeable Dr Everlene Farrier aware of pt going home. Vitals ok   Hyponatremia - pseudohyponatremia from DKA. -Resolved now since sugars are better   Hypokalemia -- resolved   Dyslipidemia - cont statins   GERD without esophagitis - PPI    Ulcerative colitis (HCC) -  continue mesalamine.  PVD --per Dr Marijean Heath note on f/u --05/2019 s/p right 05/2019 s/p PTA R popliteal/AT, mech thrombectomy to R popliteal/tibioperoneal trunk--> plavix & eliquis 2.5 bid since; 09/2019 LE u/s: near nl study. --holding plavix and xarelto due to scheduled IR  procedure --d/w dr Tyrone Sage to resume blood thinners       DVT prophylaxis: Plavix and Xarelto Advanced Care Planning:  Code Status: full code.  Procedures:HIDA scan, right UQ drain placement Family communication :none today Consults :Gen surgery, IR CODE STATUS: FULL    Pain control - [place] Controlled Substance Reporting System database was reviewed. and patient was instructed, not to drive, operate heavy machinery, perform activities at heights, swimming or participation in water activities or provide baby-sitting services while on Pain, Sleep and Anxiety Medications; until their outpatient Physician has advised to do so again. Also recommended to not to take more than prescribed Pain, Sleep and Anxiety Medications.   Diet recommendation:  Discharge Diet Orders (From admission, onward)     Start     Ordered   01/06/23 0000  Diet - low sodium heart healthy        01/06/23 0933   01/06/23 0000  Diet Carb Modified        01/06/23 0933            DISCHARGE MEDICATION: Allergies as of 01/06/2023       Reactions   Naproxen Anaphylaxis   Liraglutide Nausea Only   (Victoza)        Medication List     STOP taking these medications    NovoLIN N 100 UNIT/ML injection Generic drug: insulin NPH Human   ondansetron 4 MG disintegrating tablet Commonly known as: ZOFRAN-ODT   oxyCODONE 5 MG immediate release tablet Commonly known as: Roxicodone       TAKE these medications    amoxicillin-clavulanate 875-125 MG tablet Commonly known as: AUGMENTIN Take 1 tablet by mouth every 12 (twelve) hours for 10 days.   atorvastatin 20 MG tablet Commonly known as: LIPITOR Take 1 tablet (20 mg total) by mouth daily. What changed: when to take this   Blood Glucose Monitoring Suppl Devi 1 each by Does not apply route 3 (three) times daily. May dispense any manufacturer covered by patient's insurance.   BLOOD GLUCOSE TEST STRIPS Strp 1 each by Does not apply route 3  (three) times daily. Use as directed to check blood sugar. May dispense any manufacturer covered by patient's insurance and fits patient's device.   clopidogrel 75 MG tablet Commonly known as: PLAVIX Take 1 tablet (75 mg total) by mouth daily. What changed: when to take this   docusate sodium 100 MG capsule Commonly known as: COLACE Take 100 mg by mouth 2 (two) times daily as needed for mild constipation.   ergocalciferol 1.25 MG (50000 UT) capsule Commonly known as: VITAMIN D2 Take 50,000 Units by mouth every Saturday.   Farxiga 10 MG Tabs tablet Generic drug: dapagliflozin propanediol Take 10 mg by mouth daily.   Gvoke HypoPen 2-Pack 1 MG/0.2ML Soaj Generic drug: Glucagon Inject 1 mg into the skin as needed for up to 2 doses (Severe low blood sugar).   HYDROcodone-acetaminophen 5-325 MG tablet Commonly known as: NORCO/VICODIN Take 1 tablet by mouth every 6 (six) hours as needed for moderate pain. What changed: how much to take   insulin aspart 100 UNIT/ML FlexPen Commonly known as: NOVOLOG Inject 5 Units into the skin 3 (three) times daily with meals. Only take if eating a meal AND  Blood Glucose (BG) is 80 or higher.   insulin glargine 100 UNIT/ML Solostar Pen Commonly known as: LANTUS Inject 25 Units into the skin daily. May substitute as needed per insurance.   ketoconazole 2 % shampoo Commonly known as: NIZORAL Apply 1 Application topically 3 (three) times a week.   Lancet Device Misc 1 each by Does not apply route 3 (three) times daily. May dispense any manufacturer covered by patient's insurance.   Lancets Misc 1 each by Does not apply route 3 (three) times daily. Use as directed to check blood sugar. May dispense any manufacturer covered by patient's insurance and fits patient's device.   mesalamine 1.2 g EC tablet Commonly known as: LIALDA Take 4.8 g by mouth daily with breakfast.   Ozempic (1 MG/DOSE) 2 MG/1.5ML Sopn Generic drug: Semaglutide (1  MG/DOSE) Inject 1 mg into the skin once a week. On Saturdays What changed: Another medication with the same name was removed. Continue taking this medication, and follow the directions you see here.   pantoprazole 40 MG tablet Commonly known as: Protonix Take 1 tablet (40 mg total) by mouth daily. What changed:  when to take this reasons to take this   Pen Needles 31G X 5 MM Misc 1 each by Does not apply route 3 (three) times daily. May dispense any manufacturer covered by patient's insurance.   traMADol 50 MG tablet Commonly known as: Ultram Take 1 tablet (50 mg total) by mouth every 6 (six) hours as needed.   Xarelto 2.5 MG Tabs tablet Generic drug: rivaroxaban Take 1 tablet (2.5 mg total) by mouth 2 (two) times daily.               Discharge Care Instructions  (From admission, onward)           Start     Ordered   01/06/23 0000  Discharge wound care:       Comments: Right UQ drain dressing per RN   01/06/23 0933            Follow-up Information     Pabon, Merri Ray, MD. Go on 01/14/2023.   Specialty: General Surgery Why: @ 2:30p Contact information: [address] [place] [address] [phone]         [facility]. Schedule an appointment as soon as possible for a visit in 1 week(s).   Contact information: [address] [place] [address] [phone]         Sherlon Handing, MD. Schedule an appointment as soon as possible for a visit in 2 week(s).   Specialties: Internal Medicine, Endocrinology Why: DM follow up Contact information: [address] [place] [address] [phone]                Discharge Exam: Filed Weights   01/03/23 0015  Weight: 62.7 kg   GENERAL:  57 y.o.-year-old patient with no acute distress.  LUNGS: Normal breath sounds bilaterally, no wheezing CARDIOVASCULAR: S1, S2 normal. No murmur   ABDOMEN: Soft, nontender, nondistended. Bowel sounds present.  Right UQ Drain+ EXTREMITIES: No  edema b/l.    NEUROLOGIC: nonfocal  patient is alert and awake  Condition at discharge: fair  The results of significant diagnostics from this hospitalization (including imaging, microbiology, ancillary and laboratory) are listed below for reference.   Imaging Studies: CT GUIDED PERITONEAL/RETROPERITONEAL FLUID DRAIN BY PERC CATH  Result Date: 01/04/2023 INDICATION: 57 year old male with postoperative right upper quadrant fluid collection concerning for abscess  after laparoscopic cholecystectomy. EXAM: CT PERC DRAIN PERITONEAL ABCESS COMPARISON:  None Available. MEDICATIONS: The patient is currently admitted to the hospital and receiving intravenous antibiotics. The antibiotics were administered within an appropriate time frame prior to the initiation of the procedure. ANESTHESIA/SEDATION: Moderate (conscious) sedation was employed during this procedure. A total of Versed 1 mg and Fentanyl 50 mcg was administered intravenously. Moderate Sedation Time: 17 minutes. The patient's level of consciousness and vital signs were monitored continuously by radiology nursing throughout the procedure under my direct supervision. CONTRAST:  None COMPLICATIONS: None immediate. PROCEDURE: RADIATION DOSE REDUCTION: This exam was performed according to the departmental dose-optimization program which includes automated exposure control, adjustment of the mA and/or kV according to patient size and/or use of iterative reconstruction technique. Informed written consent was obtained from the patient after a discussion of the risks, benefits and alternatives to treatment. The patient was placed left lateral decubitus on the CT gantry and a pre procedural CT was performed re-demonstrating the known abscess/fluid collection within the gallbladder fossa. The procedure was planned. A timeout was performed prior to the initiation of the procedure. The right mid axillary region was prepped and draped  in the usual sterile fashion. The overlying soft tissues were anesthetized with 1% lidocaine with epinephrine. Appropriate trajectory was planned with the use of a 22 gauge spinal needle. An 18 gauge trocar needle was advanced into the abscess/fluid collection and a short Amplatz super stiff wire was coiled within the collection. Appropriate positioning was confirmed with a limited CT scan. The tract was serially dilated allowing placement of a 10 [place] all-purpose drainage catheter. Appropriate positioning was confirmed with a limited postprocedural CT scan. Proximally 30 ml of serosanguineous fluid was aspirated. The tube was connected to a bulb suction and sutured in place. A dressing was placed. The patient tolerated the procedure well without immediate post procedural complication. IMPRESSION: Successful CT guided placement of a 10 French all purpose drain catheter into the subhepatic fluid collection with aspiration of 30 mL of serosanguineous fluid. Samples were sent to the laboratory as requested by the ordering clinical team. Marliss Coots, MD [facility] Electronically Signed   By: Marliss Coots M.D.   On: 01/04/2023 07:41   NM HEPATOBILIARY LEAK (POST-SURGICAL)  Result Date: 01/03/2023 CLINICAL DATA:  Postop from cholecystectomy. Abdominal pain and abnormal abdominal fluid collection on recent CT. Evaluate for postop bile leak. EXAM: NUCLEAR MEDICINE HEPATOBILIARY IMAGING TECHNIQUE: Sequential images of the abdomen were obtained out to 60 minutes following intravenous administration of radiopharmaceutical. RADIOPHARMACEUTICALS:  7.2 mCi Tc-50m  Choletec IV COMPARISON:  None Available. FINDINGS: Prompt uptake and biliary excretion of activity by the liver is seen. No gallbladder activity is visualized, consistent with prior cholecystectomy. Biliary activity passes into small bowel, consistent with patent common bile duct. No extravasation of  biliary activity is seen on this study. IMPRESSION: Prior cholecystectomy.  No evidence of postop bile leak. Electronically Signed   By: Danae Orleans M.D.   On: 01/03/2023 13:28   CT Abdomen Pelvis W Contrast  Addendum Date: 01/02/2023   ADDENDUM REPORT: 01/02/2023 17:20 ADDENDUM: The original report was by Dr. Gaylyn Rong. The following addendum is by Dr. Gaylyn Rong: Radiology personnel were unable to reach Dr. Everlene Farrier by telephone, possibly related to being after hours. We were directed to Dr. Aleen Campi who is on-call, but was in surgery. I discussed the case briefly with Dr. Aleen Campi from the operating room, and he directed [place] to have  the patient proceed to the emergency room. Accordingly, I called Mr. Mathew at 5:05 p.m. and after discussing some of the findings on the CT scan he agreed to proceed to the [facility] emergency room for further assessment. I telephoned the emergency department and spoke to attending physician Dr. Derrill Kay. We discussed the case in brief so that he would be oriented to the patient. We also left a voicemail on Dr. Hurman Horn phone indicating this series of events along with the patient's contact information. Electronically Signed   By: Gaylyn Rong M.D.   On: 01/02/2023 17:20   Result Date: 01/02/2023 CLINICAL DATA:  Postoperative abdominal pain. Cholecystectomy eight days ago. Weight loss. Reduced appetite. EXAM: CT ABDOMEN AND PELVIS WITH CONTRAST TECHNIQUE: Multidetector CT imaging of the abdomen and pelvis was performed using the standard protocol following bolus administration of intravenous contrast. RADIATION DOSE REDUCTION: This exam was performed according to the departmental dose-optimization program which includes automated exposure control, adjustment of the mA and/or kV according to patient size and/or use of iterative reconstruction technique. CONTRAST:  80mL OMNIPAQUE IOHEXOL 300 MG/ML  SOLN COMPARISON:  07/16/2022 and ultrasound  12/04/2022 FINDINGS: Lower chest: Subsegmental atelectasis in the posterior basal segment right lower lobe. Hepatobiliary: Trace fluid along the gallbladder fossa as can commonly be encountered at this stage status post cholecystectomy. However, there is additional fluid tracking below the right hepatic lobe with enhancing margins measuring up to about 11.3 by 4.5 by 4.0 cm (volume = 110 cm^3), cannot exclude abscess or biloma. There is no gas in this collection. Mild focal steatosis in the liver adjacent to the falciform ligament. No biliary dilatation. Pancreas: Unremarkable Spleen: Unremarkable Adrenals/Urinary Tract: Unremarkable Stomach/Bowel: Small type 1 hiatal hernia. Diffuse colon wall thickening along with some mild wall thickening of the terminal ileum. The rectal wall does not appear thickened. Vascular/Lymphatic: Primarily soft plaque in the abdominal aorta and iliac vessels without substantial narrowing or specific abnormality in the proximal celiac trunk or SMA. I do not see any portal venous gas. Reproductive: Unremarkable Other: There is a moderate amount of scattered free intraperitoneal gas primarily along the liver edge and in the upper abdomen. This is somewhat more gas than I would typically expect on postoperative day 8 status post CO2 laparoscopy. This raises the possibility of a perforated viscus, but I do not see a specific site of perforation, and no spillage of the oral contrast into the abdominal cavity is observed, in particular I see no definite spilled contrast in the vicinity of the descending duodenum closest to the residual fluid along the gallbladder fossa. Musculoskeletal: Unremarkable IMPRESSION: 1. Moderate amount of scattered free intraperitoneal gas primarily along the liver edge and in the upper abdomen. This is somewhat more gas than I would typically expect on postoperative day 8 status post CO2 laparoscopy (conventionally, open surgical free air can last up to 10 days  postoperatively but usually CO2 following laparoscopy is absorbed more quickly). This raises the possibility of a perforated viscus, but I do not see a specific site of perforation, and no spillage of the oral contrast into the abdominal cavity is observed. 2. Diffuse colon wall thickening along with some mild wall thickening of the terminal ileum. Appearance favors colitis. Some degree of colitis was also shown on the prior CT scan from 07/16/2022. I do not see any pneumatosis, portal venous gas, or abnormal narrowing/filling defect in the superior mesenteric artery or its branches to indicate a specific vascular/ischemic cause. The IMA and  celiac trunk likewise appear patent. 3. 110 cc fluid collection tracking below the right hepatic lobe with enhancing margins, cannot exclude abscess or biloma. 4. Small type 1 hiatal hernia. 5. Subsegmental atelectasis in the posterior basal segment right lower lobe. 6. Mild aortoiliac atherosclerosis. Aortic Atherosclerosis (ICD10-I70.0). Radiology assistant personnel have been notified to put me in telephone contact with the referring physician or the referring physician's clinical representative in order to discuss these findings. Once this communication is established I will issue an addendum to this report for documentation purposes. Electronically Signed: By: Gaylyn Rong M.D. On: 01/02/2023 16:34    Microbiology: Results for orders placed or performed during the hospital encounter of 01/02/23  MRSA Next Gen by PCR, Nasal     Status: None   Collection Time: 01/03/23  1:07 AM   Specimen: Nasal Mucosa; Nasal Swab  Result Value Ref Range Status   MRSA by PCR Next Gen NOT DETECTED NOT DETECTED Final    Comment: (NOTE) The GeneXpert MRSA Assay (FDA approved for NASAL specimens only), is one component of a comprehensive MRSA colonization surveillance program. It is not intended to diagnose MRSA infection nor to guide or monitor treatment for MRSA  infections. Test performance is not FDA approved in patients less than 17 years old. Performed at [facility], [address] [place], [place] [address]   Aerobic/Anaerobic Culture w Gram Stain (surgical/deep wound)     Status: None (Preliminary result)   Collection Time: 01/03/23  4:35 PM   Specimen: Abdomen; Abscess  Result Value Ref Range Status   Specimen Description ABDOMEN ABSCESS  Final   Special Requests SYRINGE  Final   Gram Stain   Final    MODERATE WBC PRESENT,BOTH PMN AND MONONUCLEAR FEW GRAM POSITIVE COCCI Performed at [facility], [address], [place], [place] [address]    Culture FEW STREPTOCOCCUS SANGUINIS  Final   Report Status PENDING  Incomplete   Organism ID, Bacteria STREPTOCOCCUS SANGUINIS  Final      Susceptibility   Streptococcus sanguinis - MIC*    PENICILLIN 0.12 SENSITIVE Sensitive     CEFTRIAXONE <=0.12 SENSITIVE Sensitive     ERYTHROMYCIN >=8 RESISTANT Resistant     LEVOFLOXACIN 0.5 SENSITIVE Sensitive     VANCOMYCIN <=0.12 SENSITIVE Sensitive     * FEW STREPTOCOCCUS SANGUINIS    Labs: CBC: Recent Labs  Lab 01/02/23 1857 01/03/23 0522 01/05/23 0542  WBC 13.7* 11.2* 6.0  NEUTROABS 12.1*  --   --   HGB 16.5 14.0 13.2  HCT 49.5 40.2 38.2*  MCV 90.3 87.0 88.0  PLT 423* 297 235   Basic Metabolic Panel: Recent Labs  Lab 01/02/23 1857 01/03/23 0039 01/03/23 0522 01/03/23 0920 01/04/23 0424 01/05/23 0542 01/06/23 0446  NA 129*   < > 135 136 137 140 140  K 4.1   < > 3.2* 3.2* 3.2* 2.8* 3.8  CL 94*   < > 105 103 101 103 104  CO2 11*   < > 23 25 26 27 27   GLUCOSE 366*   < > 185* 162* 209* 166* 189*  BUN 23*   < > 15 13 10 9 8   CREATININE 0.94   < > 0.49* 0.56* 0.53* 0.48* 0.46*  CALCIUM 9.4   < > 8.6* 8.3* 8.4* 8.3* 8.3*  MG 1.8  --   --   --   --   --  2.0  PHOS 3.5  --   --   --   --   --   --    < > =  values in this interval not displayed.   Liver Function Tests: Recent Labs  Lab 01/02/23 1857  01/05/23 0542  AST 11* 13*  ALT 12 13  ALKPHOS 129* 93  BILITOT 2.1* 0.8  PROT 8.1 5.8*  ALBUMIN 3.6 2.6*   CBG: Recent Labs  Lab 01/05/23 0809 01/05/23 1145 01/05/23 1627 01/05/23 2033 01/06/23 0838  GLUCAP 184* 291* 263* 165* 163*    Discharge time spent: greater than 30 minutes.  Signed: Enedina Finner, MD [facility] 01/06/2023

## 2023-01-06 NOTE — Telephone Encounter (Signed)
Patient Advocate Encounter  Prior Authorization for [employer] has been approved.    PA# [id number] Insurance [employer] Electronic PA Form Effective dates: 01/05/2023 through 01/05/2024  Patients co-pay is $30.00.     Roland Earl, CPhT Pharmacy Patient Advocate Specialist [facility] Patient Advocate Team Direct Number: [phone]  Fax: [phone]

## 2023-01-09 LAB — AEROBIC/ANAEROBIC CULTURE W GRAM STAIN (SURGICAL/DEEP WOUND)

## 2023-01-14 ENCOUNTER — Encounter: Payer: BC Managed Care – PPO | Admitting: Surgery

## 2023-01-15 ENCOUNTER — Inpatient Hospital Stay: Payer: BC Managed Care – PPO | Admitting: Internal Medicine

## 2023-01-16 LAB — BLOOD GAS, VENOUS
Bicarbonate: 24.3 mmol/L (ref 20.0–28.0)
O2 Saturation: 42.3 %
pCO2, Ven: 43 mmHg — ABNORMAL LOW (ref 44–60)
pH, Ven: 7.36 (ref 7.25–7.43)

## 2023-01-26 ENCOUNTER — Encounter: Payer: Self-pay | Admitting: Surgery

## 2023-01-26 ENCOUNTER — Ambulatory Visit (INDEPENDENT_AMBULATORY_CARE_PROVIDER_SITE_OTHER): Payer: BC Managed Care – PPO | Admitting: Surgery

## 2023-01-26 VITALS — BP 105/69 | HR 90 | Temp 98.0°F | Ht 68.0 in | Wt 156.0 lb

## 2023-01-26 DIAGNOSIS — K8010 Calculus of gallbladder with chronic cholecystitis without obstruction: Secondary | ICD-10-CM

## 2023-01-26 DIAGNOSIS — Z09 Encounter for follow-up examination after completed treatment for conditions other than malignant neoplasm: Secondary | ICD-10-CM

## 2023-01-26 NOTE — Patient Instructions (Addendum)
Follow up here in 3 months for a recheck and to look at your hernia.   GENERAL POST-OPERATIVE PATIENT INSTRUCTIONS   WOUND CARE INSTRUCTIONS:  Keep a dry clean dressing on the wound if there is drainage. The initial bandage may be removed after 24 hours.  Once the wound has quit draining you may leave it open to air.  If clothing rubs against the wound or causes irritation and the wound is not draining you may cover it with a dry dressing during the daytime.  Try to keep the wound dry and avoid ointments on the wound unless directed to do so.  If the wound becomes bright red and painful or starts to drain infected material that is not clear, please contact your physician immediately.  If the wound is mildly pink and has a thick firm ridge underneath it, this is normal, and is referred to as a healing ridge.  This will resolve over the next 4-6 weeks.  BATHING: You may shower if you have been informed of this by your surgeon. However, Please do not submerge in a tub, hot tub, or pool until incisions are completely sealed or have been told by your surgeon that you may do so.  DIET:  You may eat any foods that you can tolerate.  It is a good idea to eat a high fiber diet and take in plenty of fluids to prevent constipation.  If you do become constipated you may want to take a mild laxative or take ducolax tablets on a daily basis until your bowel habits are regular.  Constipation can be very uncomfortable, along with straining, after recent surgery.  ACTIVITY:  You are encouraged to cough and deep breath or use your incentive spirometer if you were given one, every 15-30 minutes when awake.  This will help prevent respiratory complications and low grade fevers post-operatively if you had a general anesthetic.  You may want to hug a pillow when coughing and sneezing to add additional support to the surgical area, if you had abdominal or chest surgery, which will decrease pain during these times.  You are  encouraged to walk and engage in light activity for the next two weeks.  You should not lift more than 20 pounds for 6 weeks total after surgery as it could put you at increased risk for complications.  Twenty pounds is roughly equivalent to a plastic bag of groceries. At that time- Listen to your body when lifting, if you have pain when lifting, stop and then try again in a few days. Soreness after doing exercises or activities of daily living is normal as you get back in to your normal routine.  MEDICATIONS:  Try to take narcotic medications and anti-inflammatory medications, such as tylenol, ibuprofen, naprosyn, etc., with food.  This will minimize stomach upset from the medication.  Should you develop nausea and vomiting from the pain medication, or develop a rash, please discontinue the medication and contact your physician.  You should not drive, make important decisions, or operate machinery when taking narcotic pain medication.  SUNBLOCK Use sun block to incision area over the next year if this area will be exposed to sun. This helps decrease scarring and will allow you avoid a permanent darkened area over your incision.  QUESTIONS:  Please feel free to call our office if you have any questions, and we will be glad to assist you. [phone]

## 2023-01-27 NOTE — Progress Notes (Signed)
Justin Adkins is 1 month out from robotic cholecystectomy for severe chronic cholecystitis.  He was readmitted for DKA And some collection within the gallbladder fossa.  Am not certain that this was an actual infection or complication as he did not have evidence of bile leak  he feels much better.  Tolerating diet.  No nausea no vomiting no fever.  Culture from drain was gram-positive's with streptococci. Minimal output from drain   PE NAD Abd: soft, nt incisions c/d/I drain seros fluid, moved   A/P Doing well HE does have RIH , we will wait and see in 3 months No need for surgical intervention

## 2023-03-12 ENCOUNTER — Telehealth: Payer: Self-pay | Admitting: Surgery

## 2023-03-12 NOTE — Telephone Encounter (Signed)
Patient notified that his work note is ready for pick up.

## 2023-03-12 NOTE — Telephone Encounter (Signed)
Patient had robotic chole done on 12/25/22 with Dr. Everlene Farrier.  Patient states he has been back to work, having no issues. But, his employer is needing a work note stating that he can return to work full duty with no restrictions effective immediately.  Patient will come by to pick up note.

## 2023-03-16 ENCOUNTER — Encounter (INDEPENDENT_AMBULATORY_CARE_PROVIDER_SITE_OTHER): Payer: BC Managed Care – PPO

## 2023-03-16 ENCOUNTER — Ambulatory Visit (INDEPENDENT_AMBULATORY_CARE_PROVIDER_SITE_OTHER): Payer: BC Managed Care – PPO | Admitting: Vascular Surgery

## 2023-04-13 ENCOUNTER — Ambulatory Visit
Admission: EM | Admit: 2023-04-13 | Discharge: 2023-04-13 | Disposition: A | Discharge status: Home / Self Care | Payer: BC Managed Care – PPO | Attending: Physician Assistant | Admitting: Physician Assistant

## 2023-04-13 DIAGNOSIS — M5412 Radiculopathy, cervical region: Secondary | ICD-10-CM

## 2023-04-13 MED ORDER — GABAPENTIN 300 MG PO CAPS: ORAL_CAPSULE | ORAL | 0 refills | Status: AC

## 2023-04-13 NOTE — Discharge Instructions (Addendum)
-  You have a pinched nerve. - You need to have an MRI of your neck done.  You should follow-up with orthopedics at one of the offices below. - You may have Tylenol for pain relief.  I sent gabapentin as well. - Holding off on any NSAIDs since you are on a blood thinner and have history of ulcerative colitis.  Holding off on any corticosteroids since you have a history of difficult to control diabetes. - Change positions as needed.  May use ice, heat, muscle rubs, lidocaine patches. - If you have any increased weakness in the affected extremity you should go to the ER.   You have a condition requiring you to follow up with Orthopedics so please call one of the following office for appointment:   [facility] Address: [address] [place], [place] [address] Phone: [phone]  [facility] [address], [place], [place] [address] Phone: [phone]  [facility] [address] [place], [place] [address] Phone: [phone]

## 2023-04-13 NOTE — ED Provider Notes (Signed)
[facility]    CSN: [id number] Arrival date & time: 04/13/23  1454      History   Chief Complaint Chief Complaint  Patient presents with   Shoulder Pain   Arm Pain    HPI Justin Adkins is a 57 y.o. male presenting for 3 month history of left shoulder and arm pain with numbness of the left fifth finger.  Patient says that he was in the hospital for gallbladder problem and had to have surgery.  States ever since then he has had this pain.  Reports that he also had back pain but that has resolved.  Patient denies any weakness and has not dropped anything.  He is not reporting any pain of his neck or neck stiffness or range of motion issues.  Reports increased pain with sitting.  Pain is relieved slightly when he stands.  He has tried Motrin and Tylenol over-the-counter without relief and says he knows he is not supposed to be taking Motrin anyway given history of UC and use of anticoagulants. No chest pain, palpitations, dizziness, weakness, SOB. Patient denies any history of chronic neck problems, degenerative arthritis, etc.  HPI  Past Medical History:  Diagnosis Date   Atherosclerosis of native arteries of extremity with intermittent claudication (HCC)    ED (erectile dysfunction)    Elevated liver enzymes    GERD (gastroesophageal reflux disease)    History of echocardiogram    a. 07/2020 Echo: EF 60-65%, no rwma, mild LVH. Nl RV fxn. Mildly dil LA.   History of tobacco abuse    Hyperlipidemia    Hypertension    Peripheral vascular disease (HCC)    a. 05/2019 s/p PTA R popliteal/AT, mech thrombectomy to R popliteal/tibioperoneal trunk--> plavix & eliquis 2.5 bid since; 09/2019 LE u/s: near nl study.   Popliteal artery occlusion, right (HCC)    Type 2 diabetes mellitus (HCC)    Ulcerative colitis [facility])     Patient Active Problem List   Diagnosis Date Noted   Right upper quadrant abdominal abscess (HCC) 01/05/2023   Dyslipidemia 01/03/2023   Hyponatremia  01/03/2023   Pneumoperitoneum 01/03/2023   Abdominal fluid collection 01/03/2023   DKA, type 2 (HCC) 01/02/2023   Abnormal abdominal CT scan 01/02/2023   Ulcerative colitis (HCC) 01/02/2023   GERD without esophagitis 01/02/2023   Atherosclerosis of native arteries of extremity with intermittent claudication (HCC) 06/09/2021   Essential hypertension 06/09/2021   Acute hepatitis 07/24/2020   PVD (peripheral vascular disease) (HCC) 05/31/2019   Popliteal artery occlusion, right (HCC) 05/20/2019   Atherosclerosis of native arteries of extremity with rest pain (HCC) 05/19/2019   Hyperlipidemia 05/19/2019   Hyperlipidemia associated with type 2 diabetes mellitus (HCC) 05/19/2019   Type 2 diabetes mellitus with peripheral neuropathy (HCC) 01/05/2018   Other male erectile dysfunction 01/05/2018   Total or mature cataract 03/05/2017   Tobacco abuse 05/03/2013    Past Surgical History:  Procedure Laterality Date   COLONOSCOPY     COLONOSCOPY N/A 09/24/2020   Procedure: COLONOSCOPY;  Surgeon: Regis Bill, MD;  Location: [facility];  Service: Endoscopy;  Laterality: N/A;   COLONOSCOPY WITH PROPOFOL N/A 11/07/2022   Procedure: COLONOSCOPY WITH PROPOFOL;  Surgeon: Regis Bill, MD;  Location: [facility];  Service: Endoscopy;  Laterality: N/A;   ESOPHAGOGASTRODUODENOSCOPY (EGD) WITH PROPOFOL N/A 02/28/2020   Procedure: ESOPHAGOGASTRODUODENOSCOPY (EGD) WITH PROPOFOL;  Surgeon: Regis Bill, MD;  Location: [facility] END[facility];  Service: Endoscopy;  Laterality: N/A;   FOOT SURGERY  Right    HERNIA REPAIR     inguinal hernia   LOWER EXTREMITY ANGIOGRAPHY Right 05/25/2019   Procedure: LOWER EXTREMITY ANGIOGRAPHY;  Surgeon: Renford Dills, MD;  Location: [facility];  Service: Cardiovascular;  Laterality: Right;   LOWER EXTREMITY ANGIOGRAPHY Right 07/02/2021   Procedure: LOWER EXTREMITY ANGIOGRAPHY;  Surgeon: Renford Dills, MD;  Location: [facility];   Service: Cardiovascular;  Laterality: Right;   TONSILLECTOMY     VASCULAR SURGERY         Home Medications    Prior to Admission medications   Medication Sig Start Date End Date Taking? Authorizing Provider  atorvastatin (LIPITOR) 20 MG tablet Take 1 tablet (20 mg total) by mouth daily. Patient taking differently: Take 20 mg by mouth every morning. 03/11/21 06/28/26 Yes Agbor-Etang, Arlys John, MD  Blood Glucose Monitoring Suppl DEVI 1 each by Does not apply route 3 (three) times daily. May dispense any manufacturer covered by patient's insurance. 01/06/23  Yes Enedina Finner, MD  clopidogrel (PLAVIX) 75 MG tablet Take 1 tablet (75 mg total) by mouth daily. Patient taking differently: Take 75 mg by mouth every morning. 09/03/22  Yes Georgiana Spinner, NP  docusate sodium (COLACE) 100 MG capsule Take 100 mg by mouth 2 (two) times daily as needed for mild constipation.   Yes [provider]  ergocalciferol (VITAMIN D2) 1.25 MG (50000 UT) capsule Take 50,000 Units by mouth every Saturday.   Yes [provider]  FARXIGA 10 MG TABS tablet Take 10 mg by mouth daily. 07/03/20  Yes [provider]  gabapentin (NEURONTIN) 300 MG capsule Take 1 cap po a night x 2 days and then may increase to q12 hr thereafter. 04/13/23  Yes Shirlee Latch, PA-C  Glucagon (GVOKE HYPOPEN 2-PACK) 1 MG/0.2ML SOAJ Inject 1 mg into the skin as needed for up to 2 doses (Severe low blood sugar). 01/06/23  Yes Enedina Finner, MD  Glucose Blood (BLOOD GLUCOSE TEST STRIPS) STRP 1 each by Does not apply route 3 (three) times daily. Use as directed to check blood sugar. May dispense any manufacturer covered by patient's insurance and fits patient's device. 01/06/23  Yes Enedina Finner, MD  HYDROcodone-acetaminophen (NORCO/VICODIN) 5-325 MG tablet Take 1 tablet by mouth every 6 (six) hours as needed for moderate pain. 01/06/23  Yes Enedina Finner, MD  insulin aspart (NOVOLOG) 100 UNIT/ML FlexPen Inject 5 Units into the skin 3  (three) times daily with meals. Only take if eating a meal AND Blood Glucose (BG) is 80 or higher. 01/06/23  Yes Enedina Finner, MD  insulin glargine (LANTUS) 100 UNIT/ML Solostar Pen Inject 25 Units into the skin daily. May substitute as needed per insurance. 01/06/23  Yes Enedina Finner, MD  Insulin Pen Needle (PEN NEEDLES) 31G X 5 MM MISC 1 each by Does not apply route 3 (three) times daily. May dispense any manufacturer covered by patient's insurance. 01/06/23  Yes Enedina Finner, MD  ketoconazole (NIZORAL) 2 % shampoo Apply 1 Application topically 3 (three) times a week. 01/19/22  Yes [provider]  Lancet Device MISC 1 each by Does not apply route 3 (three) times daily. May dispense any manufacturer covered by patient's insurance. 01/06/23  Yes Enedina Finner, MD  Lancets MISC 1 each by Does not apply route 3 (three) times daily. Use as directed to check blood sugar. May dispense any manufacturer covered by patient's insurance and fits patient's device. 01/06/23  Yes Enedina Finner, MD  mesalamine (LIALDA) 1.2  g EC tablet Take 4.8 g by mouth daily with breakfast. 10/01/20  Yes [provider]  pantoprazole (PROTONIX) 40 MG tablet Take 1 tablet (40 mg total) by mouth daily. Patient taking differently: Take 40 mg by mouth as needed. 12/04/22 12/04/23 Yes Jene Every, MD  rivaroxaban (XARELTO) 2.5 MG TABS tablet Take 1 tablet (2.5 mg total) by mouth 2 (two) times daily. 09/03/22  Yes Georgiana Spinner, NP  Semaglutide, 1 MG/DOSE, (OZEMPIC, 1 MG/DOSE,) 2 MG/1.5ML SOPN Inject 1 mg into the skin once a week. On Saturdays   Yes [provider]  traMADol (ULTRAM) 50 MG tablet Take 1 tablet (50 mg total) by mouth every 6 (six) hours as needed. 12/04/22 12/04/23 Yes Jene Every, MD    Family History Family History  Problem Relation Age of Onset   Cancer Father    Heart attack Brother        MI @ 11    Social History Social History   Tobacco Use   Smoking status: Former    Current  packs/day: 0.00    Average packs/day: 1 pack/day for 35.0 years (35.0 ttl pk-yrs)    Types: Cigarettes    Start date: 72    Quit date: 2021    Years since quitting: 3.6    Passive exposure: Past   Smokeless tobacco: Never  Vaping Use   Vaping status: Never Used  Substance Use Topics   Alcohol use: Yes    Comment: rarely   Drug use: No     Allergies   Naproxen and Liraglutide   Review of Systems Review of Systems  Respiratory:  Negative for shortness of breath.   Cardiovascular:  Negative for chest pain and palpitations.  Musculoskeletal:  Positive for arthralgias. Negative for neck pain and neck stiffness.  Neurological:  Positive for numbness. Negative for weakness.     Physical Exam Triage Vital Signs ED Triage Vitals  Encounter Vitals Group     BP --      Systolic BP Percentile --      Diastolic BP Percentile --      Pulse Rate 04/13/23 1632 64     Resp 04/13/23 1632 16     Temp 04/13/23 1632 97.9 F (36.6 C)     Temp Source 04/13/23 1632 Oral     SpO2 04/13/23 1632 98 %     Weight 04/13/23 1632 155 lb (70.3 kg)     Height 04/13/23 1632 5\' 8"  (1.727 m)     Head Circumference --      Peak Flow --      Pain Score 04/13/23 1635 7     Pain Loc --      Pain Education --      Exclude from Growth Chart --    No data found.  Updated Vital Signs BP 111/66 (BP Location: Left Arm)   Pulse 64   Temp 97.9 F (36.6 C) (Oral)   Resp 16   Ht 5\' 8"  (1.727 m)   Wt 155 lb (70.3 kg)   SpO2 98%   BMI 23.57 kg/m     Physical Exam Vitals and nursing note reviewed.  Constitutional:      General: He is not in acute distress.    Appearance: Normal appearance. He is well-developed. He is not ill-appearing.  HENT:     Head: Normocephalic and atraumatic.  Eyes:     General: No scleral icterus.    Conjunctiva/sclera: Conjunctivae normal.  Cardiovascular:  Rate and Rhythm: Normal rate and regular rhythm.     Heart sounds: Normal heart sounds.  Pulmonary:      Effort: Pulmonary effort is normal. No respiratory distress.     Breath sounds: Normal breath sounds.  Musculoskeletal:     Cervical back: Normal range of motion and neck supple. No tenderness or bony tenderness. No pain with movement. Normal range of motion.     Comments: No tenderness of left arm and full ROM  Skin:    General: Skin is warm and dry.     Capillary Refill: Capillary refill takes less than 2 seconds.  Neurological:     General: No focal deficit present.     Mental Status: He is alert. Mental status is at baseline.     Motor: No weakness.     Gait: Gait normal.  Psychiatric:        Mood and Affect: Mood normal.        Behavior: Behavior normal.      UC Treatments / Results  Labs (all labs ordered are listed, but only abnormal results are displayed) Labs Reviewed - No data to display  EKG   Radiology No results found.  Procedures Procedures (including critical care time)  Medications Ordered in UC Medications - No data to display  Initial Impression / Assessment and Plan / UC Course  I have reviewed the triage vital signs and the nursing notes.  Pertinent labs & imaging results that were available during my care of the patient were reviewed by me and considered in my medical decision making (see chart for details).   57 year old male presents for 24-month history of left arm pain and numbness of the left fifth finger.  Pain started after he was hospitalized and had gallbladder removed.  Tried OTC meds without relief.  On exam he has no tenderness of his neck or left arm and full range of motion of neck, shoulder, elbow.  Offered x-ray of neck but explained limitations of x-ray.  Likely needs an MRI given that symptoms have been persistent for a few months.  Suspect cervical radiculopathy.  Explained that he should follow-up with orthopedics for further evaluation of this to have an MRI and further interventions.  Patient is agreeable and states he will  follow-up with Ortho.  Information given for orthopedics.  Will avoid NSAIDs and corticosteroids given history of ulcerative colitis and difficult to control diabetes which actually led him to DKA in May.  Starting patient on gabapentin.  Discussed supportive care.  Reviewed following up with Ortho soon as possible.  Discussed ED precautions.   Final Clinical Impressions(s) / UC Diagnoses   Final diagnoses:  Cervical radiculopathy     Discharge Instructions      -You have a pinched nerve. - You need to have an MRI of your neck done.  You should follow-up with orthopedics at one of the offices below. - You may have Tylenol for pain relief.  I sent gabapentin as well. - Holding off on any NSAIDs since you are on a blood thinner and have history of ulcerative colitis.  Holding off on any corticosteroids since you have a history of difficult to control diabetes. - Change positions as needed.  May use ice, heat, muscle rubs, lidocaine patches. - If you have any increased weakness in the affected extremity you should go to the ER.   You have a condition requiring you to follow up with Orthopedics so please call one of the following  office for appointment:   [facility] Address: [address] [place], [place] [address] Phone: [phone]  [facility] [address] [place], [place], [place] [address] Phone: [phone]  [facility] [address] [place], [place] [address] Phone: [phone]      ED Prescriptions     Medication Sig Dispense Auth. Provider   gabapentin (NEURONTIN) 300 MG capsule Take 1 cap po a night x 2 days and then may increase to q12 hr thereafter. 30 capsule Shirlee Latch, PA-C      PDMP not reviewed this encounter.   Shirlee Latch, PA-C 04/13/23 1711

## 2023-04-13 NOTE — ED Triage Notes (Signed)
Pt c/o L shoulder & arm pain x1 mon. States had cholecystectomy back in may & pain started. Also c/o L pinky numbness since the surgery. Has tried motrin & tylenol w/o relief.

## 2023-04-27 ENCOUNTER — Encounter (INDEPENDENT_AMBULATORY_CARE_PROVIDER_SITE_OTHER): Payer: BC Managed Care – PPO

## 2023-04-27 ENCOUNTER — Ambulatory Visit (INDEPENDENT_AMBULATORY_CARE_PROVIDER_SITE_OTHER): Payer: BC Managed Care – PPO | Admitting: Vascular Surgery

## 2023-04-29 ENCOUNTER — Ambulatory Visit: Payer: BC Managed Care – PPO | Admitting: Surgery

## 2023-05-06 ENCOUNTER — Ambulatory Visit (INDEPENDENT_AMBULATORY_CARE_PROVIDER_SITE_OTHER): Payer: BC Managed Care – PPO | Admitting: Surgery

## 2023-05-06 ENCOUNTER — Encounter: Payer: Self-pay | Admitting: Surgery

## 2023-05-06 VITALS — BP 116/68 | HR 79 | Temp 98.3°F | Ht 68.0 in | Wt 166.2 lb

## 2023-05-06 DIAGNOSIS — K4091 Unilateral inguinal hernia, without obstruction or gangrene, recurrent: Secondary | ICD-10-CM

## 2023-05-06 NOTE — Progress Notes (Signed)
Outpatient Surgical Follow Up  05/06/2023  Justin Adkins is an 57 y.o. male.   Chief Complaint  Patient presents with   Follow-up    Ventral hernia    HPI: Justin Adkins is a 57 year old male well-known to me once out after cholecystectomy complicated breath and infection.  He responded appropriately to antibiotic.  He was hospitalized due to significant DKA and dehydration.  He now has started on an insulin pump.  Prior hemoglobin A1c was greater than 9 now with insulin seems to be significantly improved.  He is doing better.  No fevers no chills no recent hospitalization.  He reports that he did have a right open inguinal hernia repair in the distant past.  No records available.  He endorses some intermittent discomfort.  No evidence of incarceration or strangulation. From GI perspective he continues to improve. Have a CT scan a few months ago showing evidence of a recurrent right inguinal hernia with fat.  Past Medical History:  Diagnosis Date   Atherosclerosis of native arteries of extremity with intermittent claudication [facility])    ED (erectile dysfunction)    Elevated liver enzymes    GERD (gastroesophageal reflux disease)    History of echocardiogram    a. 07/2020 Echo: EF 60-65%, no rwma, mild LVH. Nl RV fxn. Mildly dil LA.   History of tobacco abuse    Hyperlipidemia    Hypertension    Peripheral vascular disease (HCC)    a. 05/2019 s/p PTA R popliteal/AT, mech thrombectomy to R popliteal/tibioperoneal trunk--> plavix & eliquis 2.5 bid since; 09/2019 LE u/s: near nl study.   Popliteal artery occlusion, right (HCC)    Type 2 diabetes mellitus (HCC)    Ulcerative colitis [facility])     Past Surgical History:  Procedure Laterality Date   COLONOSCOPY     COLONOSCOPY N/A 09/24/2020   Procedure: COLONOSCOPY;  Surgeon: Regis Bill, MD;  Location: [facility] ENDOSCOPY;  Service: Endoscopy;  Laterality: N/A;   COLONOSCOPY WITH PROPOFOL N/A 11/07/2022   Procedure: COLONOSCOPY WITH PROPOFOL;   Surgeon: Regis Bill, MD;  Location: [facility] ENDOSCOPY;  Service: Endoscopy;  Laterality: N/A;   ESOPHAGOGASTRODUODENOSCOPY (EGD) WITH PROPOFOL N/A 02/28/2020   Procedure: ESOPHAGOGASTRODUODENOSCOPY (EGD) WITH PROPOFOL;  Surgeon: Regis Bill, MD;  Location: [facility] ENDOSCOPY;  Service: Endoscopy;  Laterality: N/A;   FOOT SURGERY Right    HERNIA REPAIR     inguinal hernia   LOWER EXTREMITY ANGIOGRAPHY Right 05/25/2019   Procedure: LOWER EXTREMITY ANGIOGRAPHY;  Surgeon: Renford Dills, MD;  Location: [facility] INVASIVE CV LAB;  Service: Cardiovascular;  Laterality: Right;   LOWER EXTREMITY ANGIOGRAPHY Right 07/02/2021   Procedure: LOWER EXTREMITY ANGIOGRAPHY;  Surgeon: Renford Dills, MD;  Location: [facility] INVASIVE CV LAB;  Service: Cardiovascular;  Laterality: Right;   TONSILLECTOMY     VASCULAR SURGERY      Family History  Problem Relation Age of Onset   Cancer Father    Heart attack Brother        MI @ 34    Social History:  reports that he quit smoking about 3 years ago. His smoking use included cigarettes. He started smoking about 38 years ago. He has a 35 pack-year smoking history. He has been exposed to tobacco smoke. He has never used smokeless tobacco. He reports current alcohol use. He reports that he does not use drugs.  Allergies:  Allergies  Allergen Reactions   Naproxen Anaphylaxis   Liraglutide Nausea Only    (Victoza)  Medications reviewed.    ROS Full ROS performed and is otherwise negative other than what is stated in HPI   BP 116/68 (BP Location: Left Arm, Patient Position: Sitting, Cuff Size: Small)   Pulse 79   Temp 98.3 F (36.8 C) (Oral)   Ht 5\' 8"  (1.727 m)   Wt 166 lb 3.2 oz (75.4 kg)   SpO2 95%   BMI 25.27 kg/m   Physical Exam Vitals and nursing note reviewed. Exam conducted with a chaperone present.  Constitutional:      General: He is not in acute distress.    Appearance: Normal appearance. He is not ill-appearing.   Pulmonary:     Effort: Pulmonary effort is normal. No respiratory distress.     Breath sounds: No stridor.  Abdominal:     General: Abdomen is flat. There is no distension.     Palpations: Abdomen is soft. There is no mass.     Tenderness: There is no abdominal tenderness. There is no guarding or rebound.     Hernia: A hernia is present.     Comments: Patient is healing well without infection.. Evidence of partially reducible right recurrent inguinal hernia.  It is tender to palpation.  Skin:    General: Skin is warm and dry.     Capillary Refill: Capillary refill takes less than 2 seconds.  Neurological:     General: No focal deficit present.     Mental Status: He is alert and oriented to person, place, and time.  Psychiatric:        Mood and Affect: Mood normal.        Behavior: Behavior normal.        Thought Content: Thought content normal.        Judgment: Judgment normal.       Assessment/Plan: 57 yo recurrent RIH .  Discussed with the patient in detail about his disease process.  At this time patient has minimal symptoms.  Discussed with him that recurrent inguinal hernia is at different disease as de novo inguinal hernia.  Typically we do recommend repair for recurrent inguinal hernias.  With that being said we are not in a rush and he is not in a rush.  I want a make sure that he is optimized from an endocrine perspective from my glycemic control.  He will see endocrinology in a few weeks and we will obtain another hemoglobin A1c.  Last time his hemoglobin A1c was very high and suboptimal.  At this time no need for surgical intervention.  Discussed with him in detail about the new technology and the ability for [place] to perform a robotic repair on a recurrent hernia.  He is in agreement and we will see him back in a couple months.  Please note I spent 40 minutes in this encounter including personally reviewing imaging studies, coordinating his care, placing orders and performing  documentation  Sterling Big, MD [facility] Surgeon

## 2023-05-06 NOTE — Patient Instructions (Addendum)
If you have any concerns or questions, please feel free to call our office. See follow up appointment.   Ventral Hernia  A ventral hernia is a bulge of tissue from inside the abdomen that pushes through a weak area of the muscles that form the front wall of the abdomen. The tissues inside the abdomen are inside a sac (peritoneum). These tissues include the small intestine, large intestine, and the fatty tissue that covers the intestines (omentum). Sometimes, the bulge that forms a hernia contains intestines. Other hernias contain only fat. Ventral hernias do not go away without surgical treatment. There are several types of ventral hernias. You may have: A hernia at an incision site from previous abdominal surgery (incisional hernia). A hernia just above the belly button (epigastric hernia), or at the belly button (umbilical hernia). These types of hernias can develop from heavy lifting or straining. A hernia that comes and goes (reducible hernia). It may be visible only when you lift or strain. This type of hernia can be pushed back into the abdomen (reduced). A hernia that traps abdominal tissue inside the hernia (incarcerated hernia). This type of hernia does not reduce. A hernia that cuts off blood flow to the tissues inside the hernia (strangulated hernia). The tissues can start to die if this happens. This is a very painful bulge that cannot be reduced. A strangulated hernia is a medical emergency. What are the causes? This condition is caused by abdominal tissue putting pressure on an area of weakness in the abdominal muscles. What increases the risk? The following factors may make you more likely to develop this condition: Being age 50 or older. Being overweight or obese. Having had previous abdominal surgery, especially if there was an infection after surgery. Having had an injury to the abdominal wall. Frequently lifting or pushing heavy objects. Having had several pregnancies. Having  a buildup of fluid inside the abdomen (ascites). Straining to have a bowel movement or to urinate. Having frequent coughing episodes. What are the signs or symptoms? The only symptom of a ventral hernia may be a painless bulge in the abdomen. A reducible hernia may be visible only when you strain, cough, or lift. Other symptoms may include: Dull pain. A feeling of pressure. Signs and symptoms of a strangulated hernia may include: Increasing pain. Nausea and vomiting. Pain when pressing on the hernia. The skin over the hernia turning red or purple. Constipation. Blood in the stool (feces). How is this diagnosed? This condition may be diagnosed based on: Your symptoms. Your medical history. A physical exam. You may be asked to cough or strain while standing. These actions increase the pressure inside your abdomen and force the hernia through the opening in your muscles. Your health care provider may try to reduce the hernia by gently pushing the hernia back in. Imaging studies, such as an ultrasound or CT scan. How is this treated? This condition is treated with surgery. If you have a strangulated hernia, surgery is done as soon as possible. If your hernia is small and not incarcerated, you may be asked to lose some weight before surgery. Follow these instructions at home: Follow instructions from your health care provider about eating or drinking restrictions. If you are overweight, your health care provider may recommend that you increase your activity level and eat a healthier diet. Do not lift anything that is heavier than 10 lb (4.5 kg), or the limit that you are told, until your health care provider says that it is  safe. Return to your normal activities as told by your health care provider. Ask your health care provider what activities are safe for you. You may need to avoid activities that increase pressure on your hernia. Take over-the-counter and prescription medicines only as told  by your health care provider. Keep all follow-up visits. This is important. Contact a health care provider if: Your hernia gets larger. Your hernia becomes painful. Get help right away if: Your hernia becomes increasingly painful. You have pain along with any of the following: Changes in skin color in the area of the hernia. Nausea. Vomiting. Fever. These symptoms may represent a serious problem that is an emergency. Do not wait to see if the symptoms will go away. Get medical help right away. Call your local emergency services (911 in the [place] Do not drive yourself to the hospital. Summary A ventral hernia is a bulge of tissue from inside the abdomen that pushes through a weak area of the muscles that form the front wall of the abdomen. This condition is treated with surgery, which may be urgent depending on your hernia. Do not lift anything that is heavier than 10 lb (4.5 kg), and follow activity instructions from your health care provider. This information is not intended to replace advice given to you by your health care provider. Make sure you discuss any questions you have with your health care provider. Document Revised: 03/23/2020 Document Reviewed: 03/23/2020 Elsevier Patient Education  2024 [employer].

## 2023-05-25 ENCOUNTER — Other Ambulatory Visit (INDEPENDENT_AMBULATORY_CARE_PROVIDER_SITE_OTHER): Payer: Self-pay | Admitting: Nurse Practitioner

## 2023-05-25 DIAGNOSIS — H93A9 Pulsatile tinnitus, unspecified ear: Secondary | ICD-10-CM

## 2023-05-25 DIAGNOSIS — Z9889 Other specified postprocedural states: Secondary | ICD-10-CM

## 2023-05-28 ENCOUNTER — Encounter (INDEPENDENT_AMBULATORY_CARE_PROVIDER_SITE_OTHER): Payer: BC Managed Care – PPO

## 2023-05-28 ENCOUNTER — Ambulatory Visit (INDEPENDENT_AMBULATORY_CARE_PROVIDER_SITE_OTHER): Payer: BC Managed Care – PPO | Admitting: Vascular Surgery

## 2023-06-06 ENCOUNTER — Other Ambulatory Visit (INDEPENDENT_AMBULATORY_CARE_PROVIDER_SITE_OTHER): Payer: Self-pay | Admitting: Nurse Practitioner

## 2023-06-15 ENCOUNTER — Ambulatory Visit (INDEPENDENT_AMBULATORY_CARE_PROVIDER_SITE_OTHER): Payer: BC Managed Care – PPO | Admitting: Vascular Surgery

## 2023-07-02 ENCOUNTER — Encounter (INDEPENDENT_AMBULATORY_CARE_PROVIDER_SITE_OTHER): Payer: Self-pay | Admitting: Vascular Surgery

## 2023-07-02 ENCOUNTER — Ambulatory Visit (INDEPENDENT_AMBULATORY_CARE_PROVIDER_SITE_OTHER): Payer: BC Managed Care – PPO

## 2023-07-02 ENCOUNTER — Ambulatory Visit (INDEPENDENT_AMBULATORY_CARE_PROVIDER_SITE_OTHER): Payer: BC Managed Care – PPO | Admitting: Vascular Surgery

## 2023-07-02 VITALS — BP 101/62 | HR 67 | Resp 16 | Wt 172.8 lb

## 2023-07-02 DIAGNOSIS — I6523 Occlusion and stenosis of bilateral carotid arteries: Secondary | ICD-10-CM

## 2023-07-02 DIAGNOSIS — I739 Peripheral vascular disease, unspecified: Secondary | ICD-10-CM

## 2023-07-02 DIAGNOSIS — Z9889 Other specified postprocedural states: Secondary | ICD-10-CM

## 2023-07-02 DIAGNOSIS — E1142 Type 2 diabetes mellitus with diabetic polyneuropathy: Secondary | ICD-10-CM

## 2023-07-02 DIAGNOSIS — H93A9 Pulsatile tinnitus, unspecified ear: Secondary | ICD-10-CM

## 2023-07-02 DIAGNOSIS — I10 Essential (primary) hypertension: Secondary | ICD-10-CM

## 2023-07-02 DIAGNOSIS — I70211 Atherosclerosis of native arteries of extremities with intermittent claudication, right leg: Secondary | ICD-10-CM | POA: Diagnosis not present

## 2023-07-02 DIAGNOSIS — I6529 Occlusion and stenosis of unspecified carotid artery: Secondary | ICD-10-CM | POA: Insufficient documentation

## 2023-07-02 DIAGNOSIS — E782 Mixed hyperlipidemia: Secondary | ICD-10-CM

## 2023-07-02 LAB — VAS US ABI WITH/WO TBI
Left ABI: 1.28
Right ABI: 1.14

## 2023-07-02 NOTE — Progress Notes (Signed)
MRN : [id number]  Justin Adkins is a 57 y.o. (1965/12/06) male who presents with chief complaint of check circulation.  History of Present Illness:   The patient returns to the office for followup and review of the noninvasive studies.    There have been no interval changes in lower extremity symptoms. No interval shortening of the patient's claudication distance or development of rest pain symptoms. No new ulcers or wounds have occurred since the last visit.   He is also followed for carotid stenosis.   The patient denies amaurosis fugax or recent TIA symptoms. There are no documented recent neurological changes noted. There is no history of DVT, PE or superficial thrombophlebitis. The patient denies recent episodes of angina or shortness of breath.    ABI Rt=1.20 and Lt=1.24  (previous ABI's Rt=0.93 and Lt=0.99).  Previous duplex ultrasound of the right lower extremity shows primarily biphasic/triphasic waveforms throughout.  There is a 50 to 74% stenosis noted in the popliteal artery.  Duplex ultrasound of the carotid arteries obtained today demonstrates RICA 40-59% and LICA 40-59%.  Current Meds  Medication Sig   atorvastatin (LIPITOR) 20 MG tablet Take 1 tablet (20 mg total) by mouth daily. (Patient taking differently: Take 20 mg by mouth every morning.)   Blood Glucose Monitoring Suppl DEVI 1 each by Does not apply route 3 (three) times daily. May dispense any manufacturer covered by patient's insurance.   clopidogrel (PLAVIX) 75 MG tablet TAKE 1 TABLET BY MOUTH EVERY DAY   Continuous Glucose Sensor (DEXCOM G7 SENSOR) MISC    Continuous Glucose Sensor (DEXCOM G7 SENSOR) MISC USE 1 EACH EVERY 10 (TEN) DAYS   docusate sodium (COLACE) 100 MG capsule Take 100 mg by mouth 2 (two) times daily as needed for mild constipation.   ergocalciferol (VITAMIN D2) 1.25 MG (50000 UT) capsule Take 50,000 Units by mouth every  Saturday.   FARXIGA 10 MG TABS tablet Take 10 mg by mouth daily.   gabapentin (NEURONTIN) 300 MG capsule Take 1 cap po a night x 2 days and then may increase to q12 hr thereafter.   Glucagon (GVOKE HYPOPEN 2-PACK) 1 MG/0.2ML SOAJ Inject 1 mg into the skin as needed for up to 2 doses (Severe low blood sugar).   Glucose Blood (BLOOD GLUCOSE TEST STRIPS) STRP 1 each by Does not apply route 3 (three) times daily. Use as directed to check blood sugar. May dispense any manufacturer covered by patient's insurance and fits patient's device.   insulin aspart (NOVOLOG) 100 UNIT/ML FlexPen Inject 5 Units into the skin 3 (three) times daily with meals. Only take if eating a meal AND Blood Glucose (BG) is 80 or higher.   Insulin Disposable Pump (OMNIPOD 5 G7 PODS, GEN 5,) MISC Inject into the skin.   insulin glargine (LANTUS) 100 UNIT/ML Solostar Pen Inject 25 Units into the skin daily. May substitute as needed per insurance.   Insulin Pen Needle (PEN NEEDLES) 31G X 5 MM MISC 1 each by Does not apply route 3 (three) times daily. May dispense any manufacturer covered by patient's insurance.   ketoconazole (NIZORAL)  2 % shampoo Apply 1 Application topically 3 (three) times a week.   Lancet Device MISC 1 each by Does not apply route 3 (three) times daily. May dispense any manufacturer covered by patient's insurance.   Lancets MISC 1 each by Does not apply route 3 (three) times daily. Use as directed to check blood sugar. May dispense any manufacturer covered by patient's insurance and fits patient's device.   mesalamine (LIALDA) 1.2 g EC tablet Take 4.8 g by mouth daily with breakfast.   pantoprazole (PROTONIX) 40 MG tablet Take 1 tablet (40 mg total) by mouth daily. (Patient taking differently: Take 40 mg by mouth as needed.)   rivaroxaban (XARELTO) 2.5 MG TABS tablet Take 1 tablet (2.5 mg total) by mouth 2 (two) times daily.   Semaglutide, 1 MG/DOSE, (OZEMPIC, 1 MG/DOSE,) 2 MG/1.5ML SOPN Inject 1 mg into the skin  once a week. On Saturdays   traMADol (ULTRAM) 50 MG tablet Take 1 tablet (50 mg total) by mouth every 6 (six) hours as needed.    Past Medical History:  Diagnosis Date   Atherosclerosis of native arteries of extremity with intermittent claudication [facility])    ED (erectile dysfunction)    Elevated liver enzymes    GERD (gastroesophageal reflux disease)    History of echocardiogram    a. 07/2020 Echo: EF 60-65%, no rwma, mild LVH. Nl RV fxn. Mildly dil LA.   History of tobacco abuse    Hyperlipidemia    Hypertension    Peripheral vascular disease (HCC)    a. 05/2019 s/p PTA R popliteal/AT, mech thrombectomy to R popliteal/tibioperoneal trunk--> plavix & eliquis 2.5 bid since; 09/2019 LE u/s: near nl study.   Popliteal artery occlusion, right (HCC)    Type 2 diabetes mellitus (HCC)    Ulcerative colitis [facility])     Past Surgical History:  Procedure Laterality Date   COLONOSCOPY     COLONOSCOPY N/A 09/24/2020   Procedure: COLONOSCOPY;  Surgeon: Regis Bill, MD;  Location: [facility] ENDOSCOPY;  Service: Endoscopy;  Laterality: N/A;   COLONOSCOPY WITH PROPOFOL N/A 11/07/2022   Procedure: COLONOSCOPY WITH PROPOFOL;  Surgeon: Regis Bill, MD;  Location: [facility] ENDOSCOPY;  Service: Endoscopy;  Laterality: N/A;   ESOPHAGOGASTRODUODENOSCOPY (EGD) WITH PROPOFOL N/A 02/28/2020   Procedure: ESOPHAGOGASTRODUODENOSCOPY (EGD) WITH PROPOFOL;  Surgeon: Regis Bill, MD;  Location: [facility] ENDOSCOPY;  Service: Endoscopy;  Laterality: N/A;   FOOT SURGERY Right    HERNIA REPAIR     inguinal hernia   LOWER EXTREMITY ANGIOGRAPHY Right 05/25/2019   Procedure: LOWER EXTREMITY ANGIOGRAPHY;  Surgeon: Renford Dills, MD;  Location: [facility] INVASIVE CV LAB;  Service: Cardiovascular;  Laterality: Right;   LOWER EXTREMITY ANGIOGRAPHY Right 07/02/2021   Procedure: LOWER EXTREMITY ANGIOGRAPHY;  Surgeon: Renford Dills, MD;  Location: [facility] INVASIVE CV LAB;  Service: Cardiovascular;  Laterality: Right;    TONSILLECTOMY     VASCULAR SURGERY      Social History Social History   Tobacco Use   Smoking status: Former    Current packs/day: 0.00    Average packs/day: 1 pack/day for 35.0 years (35.0 ttl pk-yrs)    Types: Cigarettes    Start date: 43    Quit date: 2021    Years since quitting: 3.8    Passive exposure: Past   Smokeless tobacco: Never  Vaping Use   Vaping status: Never Used  Substance Use Topics   Alcohol use: Yes    Comment: rarely   Drug use: No  Family History Family History  Problem Relation Age of Onset   Cancer Father    Heart attack Brother        MI @ 63    Allergies  Allergen Reactions   Naproxen Anaphylaxis   Liraglutide Nausea Only    (Victoza)     REVIEW OF SYSTEMS (Negative unless checked)  Constitutional: [] Weight loss  [] Fever  [] Chills Cardiac: [] Chest pain   [] Chest pressure   [] Palpitations   [] Shortness of breath when laying flat   [] Shortness of breath with exertion. Vascular:  [x] Pain in legs with walking   [] Pain in legs at rest  [] History of DVT   [] Phlebitis   [] Swelling in legs   [] Varicose veins   [] Non-healing ulcers Pulmonary:   [] Uses home oxygen   [] Productive cough   [] Hemoptysis   [] Wheeze  [] COPD   [] Asthma Neurologic:  [] Dizziness   [] Seizures   [] History of stroke   [] History of TIA  [] Aphasia   [] Vissual changes   [] Weakness or numbness in arm   [] Weakness or numbness in leg Musculoskeletal:   [] Joint swelling   [] Joint pain   [] Low back pain Hematologic:  [] Easy bruising  [] Easy bleeding   [] Hypercoagulable state   [] Anemic Gastrointestinal:  [] Diarrhea   [] Vomiting  [x] Gastroesophageal reflux/heartburn   [] Difficulty swallowing. Genitourinary:  [] Chronic kidney disease   [] Difficult urination  [] Frequent urination   [] Blood in urine Skin:  [] Rashes   [] Ulcers  Psychological:  [] History of anxiety   []  History of major depression.  Physical Examination  Vitals:   07/02/23 0850  BP: 101/62  Pulse: 67  Resp:  16  Weight: 172 lb 12.8 oz (78.4 kg)   Body mass index is 26.27 kg/m. Gen: WD/WN, NAD Head: [place]/AT, No temporalis wasting.  Ear/Nose/Throat: Hearing grossly intact, nares w/o erythema or drainage Eyes: PER, EOMI, sclera nonicteric.  Neck: Supple, no masses.  No bruit or JVD.  Pulmonary:  Good air movement, no audible wheezing, no use of accessory muscles.  Cardiac: RRR, normal S1, S2, no Murmurs. Vascular: No trophic changes, no open wounds no carotid bruits Vessel Right Left  Radial Palpable Palpable  PT Not Palpable Not Palpable  DP Not Palpable Not Palpable  Gastrointestinal: soft, non-distended. No guarding/no peritoneal signs.  Musculoskeletal: M/S 5/5 throughout.  No visible deformity.  Neurologic: CN 2-12 intact. Pain and light touch intact in extremities.  Symmetrical.  Speech is fluent. Motor exam as listed above. Psychiatric: Judgment intact, Mood & affect appropriate for pt's clinical situation. Dermatologic: No rashes or ulcers noted.  No changes consistent with cellulitis.   CBC Lab Results  Component Value Date   WBC 6.0 01/05/2023   HGB 13.2 01/05/2023   HCT 38.2 (L) 01/05/2023   MCV 88.0 01/05/2023   PLT 235 01/05/2023    BMET    Component Value Date/Time   NA 140 01/06/2023 0446   NA 141 09/10/2020 1124   K 3.8 01/06/2023 0446   CL 104 01/06/2023 0446   CO2 27 01/06/2023 0446   GLUCOSE 189 (H) 01/06/2023 0446   BUN 8 01/06/2023 0446   BUN 13 09/10/2020 1124   CREATININE 0.46 (L) 01/06/2023 0446   CALCIUM 8.3 (L) 01/06/2023 0446   GFRNONAA >60 01/06/2023 0446   GFRAA 102 09/10/2020 1124   CrCl cannot be calculated (Patient's most recent lab result is older than the maximum 21 days allowed.).  COAG Lab Results  Component Value Date   INR 1.0 07/16/2022    Radiology No  results found.   Assessment/Plan 1. Atherosclerosis of native artery of right lower extremity with intermittent claudication (HCC)  Recommend:  The patient has evidence of  atherosclerosis of the lower extremities with claudication.  The patient does not voice lifestyle limiting changes at this point in time.  His arterial reconstruction remains patent.  Noninvasive studies do not suggest clinically significant change.  No invasive studies, angiography or surgery at this time The patient should continue walking and begin a more formal exercise program.  The patient should continue antiplatelet therapy and aggressive treatment of the lipid abnormalities  No changes in the patient's medications at this time  Continued surveillance is indicated as atherosclerosis is likely to progress with time.    The patient will continue follow up with noninvasive studies as ordered.  - VAS [place] ABI WITH/WO TBI; Future  2. Bilateral carotid artery stenosis Recommend:  Given the patient's asymptomatic subcritical stenosis no further invasive testing or surgery at this time.  Duplex ultrasound shows 40-59% stenosis bilaterally.  Continue antiplatelet therapy as prescribed Continue management of CAD, HTN and Hyperlipidemia Healthy heart diet,  encouraged exercise at least 4 times per week  Follow up in 12 months with duplex ultrasound and physical exam  - VAS US CAROTID; Future  3. Essential hypertension Continue antihypertensive medications as already ordered, these medications have been reviewed and there are no changes at this time.  4. Type 2 diabetes mellitus with peripheral neuropathy (HCC) Continue hypoglycemic medications as already ordered, these medications have been reviewed and there are no changes at this time.  Hgb A1C to be monitored as already arranged by primary service  5. Mixed hyperlipidemia Continue statin as ordered and reviewed, no changes at this time    Levora Dredge, MD  07/02/2023 8:54 AM

## 2023-07-06 ENCOUNTER — Ambulatory Visit: Payer: BC Managed Care – PPO | Admitting: Surgery

## 2023-07-06 ENCOUNTER — Encounter: Payer: Self-pay | Admitting: Surgery

## 2023-07-06 VITALS — BP 103/56 | HR 74 | Temp 98.0°F | Ht 68.0 in | Wt 173.0 lb

## 2023-07-06 DIAGNOSIS — K4091 Unilateral inguinal hernia, without obstruction or gangrene, recurrent: Secondary | ICD-10-CM

## 2023-07-06 NOTE — Patient Instructions (Signed)
See follow up appointment.   Ventral Hernia  A ventral hernia is a bulge of tissue from inside the abdomen that pushes through a weak area of the muscles that form the front wall of the abdomen. The tissues inside the abdomen are inside a sac (peritoneum). These tissues include the small intestine, large intestine, and the fatty tissue that covers the intestines (omentum). Sometimes, the bulge that forms a hernia contains intestines. Other hernias contain only fat. Ventral hernias do not go away without surgical treatment. There are several types of ventral hernias. You may have: A hernia at an incision site from previous abdominal surgery (incisional hernia). A hernia just above the belly button (epigastric hernia), or at the belly button (umbilical hernia). These types of hernias can develop from heavy lifting or straining. A hernia that comes and goes (reducible hernia). It may be visible only when you lift or strain. This type of hernia can be pushed back into the abdomen (reduced). A hernia that traps abdominal tissue inside the hernia (incarcerated hernia). This type of hernia does not reduce. A hernia that cuts off blood flow to the tissues inside the hernia (strangulated hernia). The tissues can start to die if this happens. This is a very painful bulge that cannot be reduced. A strangulated hernia is a medical emergency. What are the causes? This condition is caused by abdominal tissue putting pressure on an area of weakness in the abdominal muscles. What increases the risk? The following factors may make you more likely to develop this condition: Being age 61 or older. Being overweight or obese. Having had previous abdominal surgery, especially if there was an infection after surgery. Having had an injury to the abdominal wall. Frequently lifting or pushing heavy objects. Having had several pregnancies. Having a buildup of fluid inside the abdomen (ascites). Straining to have a bowel  movement or to urinate. Having frequent coughing episodes. What are the signs or symptoms? The only symptom of a ventral hernia may be a painless bulge in the abdomen. A reducible hernia may be visible only when you strain, cough, or lift. Other symptoms may include: Dull pain. A feeling of pressure. Signs and symptoms of a strangulated hernia may include: Increasing pain. Nausea and vomiting. Pain when pressing on the hernia. The skin over the hernia turning red or purple. Constipation. Blood in the stool (feces). How is this diagnosed? This condition may be diagnosed based on: Your symptoms. Your medical history. A physical exam. You may be asked to cough or strain while standing. These actions increase the pressure inside your abdomen and force the hernia through the opening in your muscles. Your health care provider may try to reduce the hernia by gently pushing the hernia back in. Imaging studies, such as an ultrasound or CT scan. How is this treated? This condition is treated with surgery. If you have a strangulated hernia, surgery is done as soon as possible. If your hernia is small and not incarcerated, you may be asked to lose some weight before surgery. Follow these instructions at home: Follow instructions from your health care provider about eating or drinking restrictions. If you are overweight, your health care provider may recommend that you increase your activity level and eat a healthier diet. Do not lift anything that is heavier than 10 lb (4.5 kg), or the limit that you are told, until your health care provider says that it is safe. Return to your normal activities as told by your health care provider. Ask  your health care provider what activities are safe for you. You may need to avoid activities that increase pressure on your hernia. Take over-the-counter and prescription medicines only as told by your health care provider. Keep all follow-up visits. This is  important. Contact a health care provider if: Your hernia gets larger. Your hernia becomes painful. Get help right away if: Your hernia becomes increasingly painful. You have pain along with any of the following: Changes in skin color in the area of the hernia. Nausea. Vomiting. Fever. These symptoms may represent a serious problem that is an emergency. Do not wait to see if the symptoms will go away. Get medical help right away. Call your local emergency services (911 in the [place] Do not drive yourself to the hospital. Summary A ventral hernia is a bulge of tissue from inside the abdomen that pushes through a weak area of the muscles that form the front wall of the abdomen. This condition is treated with surgery, which may be urgent depending on your hernia. Do not lift anything that is heavier than 10 lb (4.5 kg), and follow activity instructions from your health care provider. This information is not intended to replace advice given to you by your health care provider. Make sure you discuss any questions you have with your health care provider. Document Revised: 03/23/2020 Document Reviewed: 03/23/2020 Elsevier Patient Education  2024 [employer].

## 2023-07-07 NOTE — Progress Notes (Signed)
Outpatient Surgical Follow Up   Justin Adkins is an 57 y.o. male.   Chief Complaint  Patient presents with   Follow-up    HPI: Justin Adkins is a 57 year old male well-known to me 6 months out from robotic cholecystectomy.  It was complicated by DKA and he required readmission due to uncontrolled diabetes and dehydration.  He is doing much better.  He continues to smoke.  No fevers no chills he endorses some mild intermittent discomfort on the right inguinal area.  We do have an known history of recurrent right inguinal hernia.  He did have a repair several years ago at outside facility.  I do not have operative reports. I Did review his recent CT showing evidence of a right inguinal hernia. Past Medical History:  Diagnosis Date   Atherosclerosis of native arteries of extremity with intermittent claudication [facility])    ED (erectile dysfunction)    Elevated liver enzymes    GERD (gastroesophageal reflux disease)    History of echocardiogram    a. 07/2020 Echo: EF 60-65%, no rwma, mild LVH. Nl RV fxn. Mildly dil LA.   History of tobacco abuse    Hyperlipidemia    Hypertension    Peripheral vascular disease (HCC)    a. 05/2019 s/p PTA R popliteal/AT, mech thrombectomy to R popliteal/tibioperoneal trunk--> plavix & eliquis 2.5 bid since; 09/2019 LE u/s: near nl study.   Popliteal artery occlusion, right (HCC)    Type 2 diabetes mellitus (HCC)    Ulcerative colitis [facility])     Past Surgical History:  Procedure Laterality Date   COLONOSCOPY     COLONOSCOPY N/A 09/24/2020   Procedure: COLONOSCOPY;  Surgeon: Regis Bill, MD;  Location: [facility] ENDOSCOPY;  Service: Endoscopy;  Laterality: N/A;   COLONOSCOPY WITH PROPOFOL N/A 11/07/2022   Procedure: COLONOSCOPY WITH PROPOFOL;  Surgeon: Regis Bill, MD;  Location: [facility] ENDOSCOPY;  Service: Endoscopy;  Laterality: N/A;   ESOPHAGOGASTRODUODENOSCOPY (EGD) WITH PROPOFOL N/A 02/28/2020   Procedure: ESOPHAGOGASTRODUODENOSCOPY (EGD) WITH  PROPOFOL;  Surgeon: Regis Bill, MD;  Location: [facility] ENDOSCOPY;  Service: Endoscopy;  Laterality: N/A;   FOOT SURGERY Right    HERNIA REPAIR     inguinal hernia   LOWER EXTREMITY ANGIOGRAPHY Right 05/25/2019   Procedure: LOWER EXTREMITY ANGIOGRAPHY;  Surgeon: Renford Dills, MD;  Location: [facility] INVASIVE CV LAB;  Service: Cardiovascular;  Laterality: Right;   LOWER EXTREMITY ANGIOGRAPHY Right 07/02/2021   Procedure: LOWER EXTREMITY ANGIOGRAPHY;  Surgeon: Renford Dills, MD;  Location: [facility] INVASIVE CV LAB;  Service: Cardiovascular;  Laterality: Right;   TONSILLECTOMY     VASCULAR SURGERY      Family History  Problem Relation Age of Onset   Cancer Father    Heart attack Brother        MI @ 21    Social History:  reports that he quit smoking about 3 years ago. His smoking use included cigarettes. He started smoking about 38 years ago. He has a 35 pack-year smoking history. He has been exposed to tobacco smoke. He has never used smokeless tobacco. He reports current alcohol use. He reports that he does not use drugs.  Allergies:  Allergies  Allergen Reactions   Naproxen Anaphylaxis   Liraglutide Nausea Only    (Victoza)    Medications reviewed.    ROS Full ROS performed and is otherwise negative other than what is stated in HPI   BP (!) 103/56   Pulse 74   Temp 98 F (36.7  C)   Ht 5\' 8"  (1.727 m)   Wt 173 lb (78.5 kg)   SpO2 96%   BMI 26.30 kg/m   Physical Exam  Vitals and nursing note reviewed. Exam conducted with a chaperone present.  Constitutional:      General: He is not in acute distress.    Appearance: Normal appearance. He is normal weight. He is not ill-appearing.  Eyes:     General: No scleral icterus.       Right eye: No discharge.        Left eye: No discharge.  Cardiovascular:     Rate and Rhythm: Normal rate and regular rhythm.     Heart sounds: No murmur heard.    No friction rub.  Pulmonary:     Effort: Pulmonary effort is  normal. No respiratory distress.     Breath sounds: Normal breath sounds. No stridor. No wheezing or rhonchi.  Abdominal:     General: Abdomen is flat. There is no distension.     Palpations: Abdomen is soft. There is no mass.     Tenderness: There is no abdominal tenderness. There is no guarding or rebound. Prio scars from chole are healing well    Hernia: A hernia is present.     Comments: HE Does have a recurrent right inguinal hernia that is reducible on the right side  Musculoskeletal:        General: Normal range of motion.     Cervical back: Normal range of motion and neck supple. No rigidity or tenderness.  Lymphadenopathy:     Cervical: No cervical adenopathy.  Skin:    General: Skin is warm and dry.     Capillary Refill: Capillary refill takes less than 2 seconds.  Neurological:     General: No focal deficit present.     Mental Status: He is alert and oriented to person, place, and time.  Psychiatric:        Mood and Affect: Mood normal.        Behavior: Behavior normal.        Thought Content: Thought content normal.        Judgment: Judgment normal.     Assessment/Plan: 57 year old male with cholecystectomy 4 months ago now recovered he still has a recurrent right inguinal hernia.  He is given very little symptoms.  He wishes to wait at least a couple months before definitive intervention is performed.  I warned him about potential telltale signs.  She understands to call us if his symptoms worsen.  Is not a spent 30 minutes in this encounter including personally reviewing imaging studies, coordinating his care, placing orders and performing documentation  Sterling Big, MD [facility] General Surgeon

## 2023-08-25 ENCOUNTER — Other Ambulatory Visit: Payer: Self-pay | Admitting: Physician Assistant

## 2023-08-25 DIAGNOSIS — R200 Anesthesia of skin: Secondary | ICD-10-CM

## 2023-08-25 DIAGNOSIS — M542 Cervicalgia: Secondary | ICD-10-CM

## 2023-08-25 DIAGNOSIS — M5412 Radiculopathy, cervical region: Secondary | ICD-10-CM

## 2023-08-25 DIAGNOSIS — M79602 Pain in left arm: Secondary | ICD-10-CM

## 2023-08-28 ENCOUNTER — Encounter: Payer: Self-pay | Admitting: Physician Assistant

## 2023-09-03 ENCOUNTER — Ambulatory Visit
Admission: RE | Admit: 2023-09-03 | Discharge: 2023-09-03 | Disposition: A | Discharge status: Home / Self Care | Payer: 59 | Source: Ambulatory Visit | Attending: Physician Assistant | Admitting: Physician Assistant

## 2023-09-03 DIAGNOSIS — M79602 Pain in left arm: Secondary | ICD-10-CM

## 2023-09-03 DIAGNOSIS — M542 Cervicalgia: Secondary | ICD-10-CM

## 2023-09-03 DIAGNOSIS — M5412 Radiculopathy, cervical region: Secondary | ICD-10-CM

## 2023-09-03 DIAGNOSIS — R200 Anesthesia of skin: Secondary | ICD-10-CM

## 2023-09-09 ENCOUNTER — Ambulatory Visit: Payer: BC Managed Care – PPO | Admitting: Surgery

## 2023-09-14 ENCOUNTER — Ambulatory Visit (INDEPENDENT_AMBULATORY_CARE_PROVIDER_SITE_OTHER): Payer: 59 | Admitting: Surgery

## 2023-09-14 ENCOUNTER — Encounter: Payer: Self-pay | Admitting: Surgery

## 2023-09-14 VITALS — BP 111/65 | HR 71 | Temp 98.2°F | Ht 68.0 in | Wt 180.0 lb

## 2023-09-14 DIAGNOSIS — K4091 Unilateral inguinal hernia, without obstruction or gangrene, recurrent: Secondary | ICD-10-CM | POA: Diagnosis not present

## 2023-09-14 NOTE — Patient Instructions (Signed)
Follow-up with our office as needed.  Please call and ask to speak with a nurse if you develop questions or concerns.   Hernia, Adult     A hernia happens when an organ or tissue inside your body pushes out through a weak spot in the muscles of your belly. This makes a bulge. The bulge may be: In a scar from surgery. This type of bulge is called an incisional hernia. Near your belly button. This type is called an umbilical hernia. In your groin, which is the area where your leg meets your lower belly. This type is called an inguinal hernia. If you're male, this type could also be in your scrotum. In your upper thigh. This type is called a femoral hernia. Inside your belly. This type is called a hiatal hernia. It happens when your stomach slides above your diaphragm, which is the muscle between your belly and your chest. What are the causes? You may get a hernia if: You lift heavy things. You cough over a long period of time. You have trouble pooping, also called constipation. Trouble pooping can lead to straining. There's a cut from surgery in your belly. You have a problem that's present at birth. There's fluid around your belly. You're male and have a testicle that hasn't moved down into your scrotum. You may be at greater risk for hernia if: You smoke. You're very overweight. What are the signs or symptoms? The main symptom is a bulge, but the bulge may not always be seen. It may grow bigger or be easier to see when you cough or strain, such as when you lift something heavy. If you can push the bulge back into your belly, it may not cause pain. If you can't push it back into your belly, it may lose its blood supply. This can cause: Pain. Fever. Nausea and vomiting. Swelling. Trouble pooping. How is this diagnosed? A hernia may be diagnosed based on your symptoms, medical history, and an exam. Your health care provider may ask you to cough or move in a way that makes the bulge  easier to see. You may also have tests done. These may include: X-rays. Ultrasound. CT scan. How is this treated? A hernia that's small and painless may not need to be treated. A hernia that's large or painful may be treated with surgery. Surgery involves pushing the bulge back into place and fixing the weak area of the muscle or belly. Follow these instructions at home: Activity Try not to strain. You may have to avoid lifting. Ask how much weight you can safely lift. If you lift something heavy, use your leg muscles. Do not use your back muscles to lift. Prevent trouble pooping You may need to take these actions to prevent or treat trouble pooping: Drink enough fluid to keep your pee (urine) pale yellow. Take medicines to help you poop. Eat foods high in fiber. These include beans, whole grains, and fresh fruits and vegetables. General instructions When you cough, try to cough gently. Try to push the bulge back in by very gently pressing on it when you're lying down. Do not try to force it back in if it won't push in easily. If you're overweight, work with your provider to lose weight safely. Do not smoke, vape, or use products with nicotine or tobacco in them. If you need help quitting, talk with your provider. If you're going to have surgery, watch your hernia for changes in shape, size, or color. Tell your  provider about any changes. Contact a health care provider if: You get new pain, swelling, or redness near your hernia. You have trouble pooping. Your poop is harder or larger than normal. You have a fever or chills. You have nausea or vomiting. Your hernia can't be pushed in. Get help right away if: You have belly pain that gets worse. Your hernia: Changes in shape or size. Changes color. Feels hard or hurts when you touch it. These symptoms may be an emergency. Call 911 right away. Do not wait to see if the symptoms will go away. Do not drive yourself to the  hospital. This information is not intended to replace advice given to you by your health care provider. Make sure you discuss any questions you have with your health care provider. Document Revised: 05/07/2023 Document Reviewed: 10/28/2022 Elsevier Patient Education  2024 [employer].

## 2023-09-15 ENCOUNTER — Other Ambulatory Visit (INDEPENDENT_AMBULATORY_CARE_PROVIDER_SITE_OTHER): Payer: Self-pay | Admitting: Nurse Practitioner

## 2023-09-18 ENCOUNTER — Encounter: Payer: Self-pay | Admitting: Surgery

## 2023-09-18 NOTE — Progress Notes (Signed)
Outpatient Surgical Follow Up  09/18/2023  Justin Adkins is an 59 y.o. male.   Chief Complaint  Patient presents with   Follow-up    HPI: Michaelis a very pleasant 58 year old male well-known to me had rob  cholecystectomy  last year complicated  DKA.  He responded appropriately to antibiotic.  He was hospitalized due to significant DKA and dehydration.   HbA1C 6.5 down from 9 His diabetes has improved significantly.   He is doing better.  No fevers no chills no recent hospitalization.  He reports that he did have a right open inguinal hernia repair in the distant past.  No records available.  He endorses minimal discomfort.  No evidence of incarceration or strangulation. From GI perspective he continues to improve. Have a CT scan a few months ago ( pers reviewed)  showing evidence of a recurrent right inguinal hernia with fat. Recently he is having neck pain and some numbness on his upper arm including left arm pain and left elbow.  The pain is intermittent it is neuropathic. He seen multiple providers for his cervical spinal issues  Past Medical History:  Diagnosis Date   Atherosclerosis of native arteries of extremity with intermittent claudication [facility])    ED (erectile dysfunction)    Elevated liver enzymes    GERD (gastroesophageal reflux disease)    History of echocardiogram    a. 07/2020 Echo: EF 60-65%, no rwma, mild LVH. Nl RV fxn. Mildly dil LA.   History of tobacco abuse    Hyperlipidemia    Hypertension    Peripheral vascular disease (HCC)    a. 05/2019 s/p PTA R popliteal/AT, mech thrombectomy to R popliteal/tibioperoneal trunk--> plavix & eliquis 2.5 bid since; 09/2019 LE u/s: near nl study.   Popliteal artery occlusion, right (HCC)    Type 2 diabetes mellitus (HCC)    Ulcerative colitis [facility])     Past Surgical History:  Procedure Laterality Date   COLONOSCOPY     COLONOSCOPY N/A 09/24/2020   Procedure: COLONOSCOPY;  Surgeon: Regis Bill, MD;  Location:  [facility] ENDOSCOPY;  Service: Endoscopy;  Laterality: N/A;   COLONOSCOPY WITH PROPOFOL N/A 11/07/2022   Procedure: COLONOSCOPY WITH PROPOFOL;  Surgeon: Regis Bill, MD;  Location: [facility] ENDOSCOPY;  Service: Endoscopy;  Laterality: N/A;   ESOPHAGOGASTRODUODENOSCOPY (EGD) WITH PROPOFOL N/A 02/28/2020   Procedure: ESOPHAGOGASTRODUODENOSCOPY (EGD) WITH PROPOFOL;  Surgeon: Regis Bill, MD;  Location: [facility] ENDOSCOPY;  Service: Endoscopy;  Laterality: N/A;   FOOT SURGERY Right    HERNIA REPAIR     inguinal hernia   LOWER EXTREMITY ANGIOGRAPHY Right 05/25/2019   Procedure: LOWER EXTREMITY ANGIOGRAPHY;  Surgeon: Renford Dills, MD;  Location: [facility] INVASIVE CV LAB;  Service: Cardiovascular;  Laterality: Right;   LOWER EXTREMITY ANGIOGRAPHY Right 07/02/2021   Procedure: LOWER EXTREMITY ANGIOGRAPHY;  Surgeon: Renford Dills, MD;  Location: [facility] INVASIVE CV LAB;  Service: Cardiovascular;  Laterality: Right;   TONSILLECTOMY     VASCULAR SURGERY      Family History  Problem Relation Age of Onset   Cancer Father    Heart attack Brother        MI @ 41    Social History:  reports that he quit smoking about 4 years ago. His smoking use included cigarettes. He started smoking about 39 years ago. He has a 35 pack-year smoking history. He has been exposed to tobacco smoke. He has never used smokeless tobacco. He reports current alcohol use. He reports that he does  not use drugs.  Allergies:  Allergies  Allergen Reactions   Naproxen Anaphylaxis   Liraglutide Nausea Only    (Victoza)    Medications reviewed.    ROS Full ROS performed and is otherwise negative other than what is stated in HPI   BP 111/65   Pulse 71   Temp 98.2 F (36.8 C)   Ht 5\' 8"  (1.727 m)   Wt 180 lb (81.6 kg)   SpO2 98%   BMI 27.37 kg/m   Physical Exam  Vitals and nursing note reviewed. Exam conducted with a chaperone present.  Constitutional:      General: He is not in acute distress.     Appearance: Normal appearance. He is normal weight. He is not ill-appearing.  Eyes:     General: No scleral icterus.       Right eye: No discharge.        Left eye: No discharge.  Cardiovascular:     Rate and Rhythm: Normal rate and regular rhythm.     Heart sounds: No murmur heard.    No friction rub.  Pulmonary:     Effort: Pulmonary effort is normal. No respiratory distress.     Breath sounds: Normal breath sounds. No stridor. No wheezing or rhonchi.  Abdominal:     General: Abdomen is flat. There is no distension.     Palpations: Abdomen is soft. There is no mass.     Tenderness: There is no abdominal tenderness. There is no guarding or rebound. Prio scars from chole are healing well    Hernia: A hernia is present.     Comments: HE Does have a recurrent right inguinal hernia that is reducible on the right side  Musculoskeletal:        General: Normal range of motion.     Cervical back: Normal range of motion and neck supple. No rigidity or tenderness.  Lymphadenopathy:     Cervical: No cervical adenopathy.  Skin:    General: Skin is warm and dry.     Capillary Refill: Capillary refill takes less than 2 seconds.  Neurological:     General: No focal deficit present.     Mental Status: He is alert and oriented to person, place, and time.  Psychiatric:        Mood and Affect: Mood normal.        Behavior: Behavior normal.        Thought Content: Thought content normal.        Judgment: Judgment normal.      Assessment/Plan: 58 year old male with Rob. cholecystectomy last year now recovered he still has a recurrent right inguinal hernia.  He is given very little symptoms.  HE is not ready for another intervention.. I think he wants to focus on his cervical issues at this time I warned him about potential telltale signs.  he understands to call us if his symptoms worsen.   Please note  I spent 30 minutes in this encounter including personally reviewing imaging studies,  coordinating his care, placing orders and performing documentation    Sterling Big, MD [facility] General Surgeon

## 2024-01-05 ENCOUNTER — Encounter (INDEPENDENT_AMBULATORY_CARE_PROVIDER_SITE_OTHER): Payer: Self-pay

## 2024-07-01 ENCOUNTER — Other Ambulatory Visit (INDEPENDENT_AMBULATORY_CARE_PROVIDER_SITE_OTHER): Payer: Self-pay | Admitting: Vascular Surgery

## 2024-07-01 DIAGNOSIS — Z9889 Other specified postprocedural states: Secondary | ICD-10-CM

## 2024-07-01 DIAGNOSIS — I6523 Occlusion and stenosis of bilateral carotid arteries: Secondary | ICD-10-CM

## 2024-07-04 ENCOUNTER — Other Ambulatory Visit (INDEPENDENT_AMBULATORY_CARE_PROVIDER_SITE_OTHER): Payer: Self-pay | Admitting: Vascular Surgery

## 2024-07-04 ENCOUNTER — Ambulatory Visit (INDEPENDENT_AMBULATORY_CARE_PROVIDER_SITE_OTHER): Payer: BC Managed Care – PPO

## 2024-07-04 ENCOUNTER — Encounter (INDEPENDENT_AMBULATORY_CARE_PROVIDER_SITE_OTHER): Payer: Self-pay | Admitting: Vascular Surgery

## 2024-07-04 ENCOUNTER — Ambulatory Visit (INDEPENDENT_AMBULATORY_CARE_PROVIDER_SITE_OTHER): Payer: BC Managed Care – PPO | Admitting: Vascular Surgery

## 2024-07-04 ENCOUNTER — Telehealth (INDEPENDENT_AMBULATORY_CARE_PROVIDER_SITE_OTHER): Payer: Self-pay

## 2024-07-04 VITALS — BP 149/81 | HR 65 | Resp 16 | Ht 68.0 in | Wt 195.8 lb

## 2024-07-04 DIAGNOSIS — I10 Essential (primary) hypertension: Secondary | ICD-10-CM | POA: Diagnosis not present

## 2024-07-04 DIAGNOSIS — E782 Mixed hyperlipidemia: Secondary | ICD-10-CM

## 2024-07-04 DIAGNOSIS — E1142 Type 2 diabetes mellitus with diabetic polyneuropathy: Secondary | ICD-10-CM

## 2024-07-04 DIAGNOSIS — I739 Peripheral vascular disease, unspecified: Secondary | ICD-10-CM | POA: Diagnosis not present

## 2024-07-04 DIAGNOSIS — Z9889 Other specified postprocedural states: Secondary | ICD-10-CM | POA: Diagnosis not present

## 2024-07-04 DIAGNOSIS — I70211 Atherosclerosis of native arteries of extremities with intermittent claudication, right leg: Secondary | ICD-10-CM | POA: Diagnosis not present

## 2024-07-04 DIAGNOSIS — I6523 Occlusion and stenosis of bilateral carotid arteries: Secondary | ICD-10-CM

## 2024-07-04 MED ORDER — CLOPIDOGREL BISULFATE 75 MG PO TABS: 75.0000 mg | ORAL_TABLET | Freq: Every day | ORAL | 2 refills | Status: AC

## 2024-07-04 NOTE — Telephone Encounter (Signed)
 Spoke with the patient and he is scheduled with Dr. Jama for a right leg angio on 07/13/24 with a 6:45 am arrival time to the [facility]. Pre-procedure instructions were discussed and will be sent to Mychart and mailed.

## 2024-07-04 NOTE — H&P (View-Only) (Signed)
 MRN : [id number]  Justin Adkins is a 58 y.o. (March 21, 1966) male who presents with chief complaint of check circulation.  History of Present Illness:   The patient returns to the office for followup and review of the noninvasive studies.    There has been an interval changes in lower extremity symptoms. He notes shortening of the patient's claudication distance but no development of rest pain symptoms. He notes his right foot feels numb and tight/ aches.  No new ulcers or wounds have occurred since the last visit.   He is also followed for carotid stenosis.   The patient denies amaurosis fugax or recent TIA symptoms. There are no documented recent neurological changes noted. There is no history of DVT, PE or superficial thrombophlebitis. The patient denies recent episodes of angina or shortness of breath.    ABI Rt=0.91 and Lt=1.07  (previous ABI's Rt=1.14 and Lt=1.28).   Duplex ultrasound of the right lower extremity shows primarily biphasic/triphasic waveforms throughout.  There is now a 75-99% stenosis in the mid SFA (PSV 402 cm/sec) and the previous 50 to 74% stenosis noted in the popliteal artery.   Duplex ultrasound of the carotid arteries obtained today demonstrates RICA 40-59% and LICA 40-59%. No change compared to previous study  Current Meds  Medication Sig   atorvastatin  (LIPITOR) 20 MG tablet Take 1 tablet (20 mg total) by mouth daily. (Patient taking differently: Take 20 mg by mouth every morning.)   Blood Glucose Monitoring Suppl DEVI 1 each by Does not apply route 3 (three) times daily. May dispense any manufacturer covered by patient's insurance.   Continuous Glucose Sensor (DEXCOM G7 SENSOR) MISC    Continuous Glucose Sensor (DEXCOM G7 SENSOR) MISC USE 1 EACH EVERY 10 (TEN) DAYS   docusate sodium  (COLACE) 100 MG capsule Take 100 mg by mouth 2 (two) times daily as needed for mild constipation.    ergocalciferol  (VITAMIN D2) 1.25 MG (50000 UT) capsule Take 50,000 Units by mouth every Saturday.   FARXIGA 10 MG TABS tablet Take 10 mg by mouth daily.   gabapentin  (NEURONTIN ) 300 MG capsule Take 1 cap po a night x 2 days and then may increase to q12 hr thereafter.   Glucagon  (GVOKE HYPOPEN  2-PACK) 1 MG/0.2ML SOAJ Inject 1 mg into the skin as needed for up to 2 doses (Severe low blood sugar).   Glucose Blood (BLOOD GLUCOSE TEST STRIPS) STRP 1 each by Does not apply route 3 (three) times daily. Use as directed to check blood sugar. May dispense any manufacturer covered by patient's insurance and fits patient's device.   Insulin  Disposable Pump (OMNIPOD 5 G7 PODS, GEN 5,) MISC Inject into the skin.   Insulin  Pen Needle (PEN NEEDLES) 31G X 5 MM MISC 1 each by Does not apply route 3 (three) times daily. May dispense any manufacturer covered by patient's insurance.   ketoconazole (NIZORAL) 2 % shampoo Apply 1 Application topically 3 (three) times a week.   Lancet Device MISC 1 each by Does not apply route 3 (three) times daily. May dispense any manufacturer covered by patient's insurance.  Lancets MISC 1 each by Does not apply route 3 (three) times daily. Use as directed to check blood sugar. May dispense any manufacturer covered by patient's insurance and fits patient's device.   pantoprazole  (PROTONIX ) 40 MG tablet Take 1 tablet (40 mg total) by mouth daily.   rivaroxaban  (XARELTO ) 2.5 MG TABS tablet Take 1 tablet (2.5 mg total) by mouth 2 (two) times daily.   Semaglutide, 1 MG/DOSE, (OZEMPIC, 1 MG/DOSE,) 2 MG/1.5ML SOPN Inject 1 mg into the skin once a week. On Saturdays    Past Medical History:  Diagnosis Date   Atherosclerosis of native arteries of extremity with intermittent claudication    ED (erectile dysfunction)    Elevated liver enzymes    GERD (gastroesophageal reflux disease)    History of echocardiogram    a. 07/2020 Echo: EF 60-65%, no rwma, mild LVH. Nl RV fxn. Mildly dil LA.    History of tobacco abuse    Hyperlipidemia    Hypertension    Peripheral vascular disease    a. 05/2019 s/p PTA R popliteal/AT, mech thrombectomy to R popliteal/tibioperoneal trunk--> plavix  & eliquis  2.5 bid since; 09/2019 LE u/s: near nl study.   Popliteal artery occlusion, right    Type 2 diabetes mellitus (HCC)    Ulcerative colitis [facility])     Past Surgical History:  Procedure Laterality Date   COLONOSCOPY     COLONOSCOPY N/A 09/24/2020   Procedure: COLONOSCOPY;  Surgeon: Maryruth Ole DASEN, MD;  Location: [facility] ENDOSCOPY;  Service: Endoscopy;  Laterality: N/A;   COLONOSCOPY WITH PROPOFOL  N/A 11/07/2022   Procedure: COLONOSCOPY WITH PROPOFOL ;  Surgeon: Maryruth Ole DASEN, MD;  Location: [facility] ENDOSCOPY;  Service: Endoscopy;  Laterality: N/A;   ESOPHAGOGASTRODUODENOSCOPY (EGD) WITH PROPOFOL  N/A 02/28/2020   Procedure: ESOPHAGOGASTRODUODENOSCOPY (EGD) WITH PROPOFOL ;  Surgeon: Maryruth Ole DASEN, MD;  Location: [facility] ENDOSCOPY;  Service: Endoscopy;  Laterality: N/A;   FOOT SURGERY Right    HERNIA REPAIR     inguinal hernia   LOWER EXTREMITY ANGIOGRAPHY Right 05/25/2019   Procedure: LOWER EXTREMITY ANGIOGRAPHY;  Surgeon: Jama Cordella MATSU, MD;  Location: [facility];  Service: Cardiovascular;  Laterality: Right;   LOWER EXTREMITY ANGIOGRAPHY Right 07/02/2021   Procedure: LOWER EXTREMITY ANGIOGRAPHY;  Surgeon: Jama Cordella MATSU, MD;  Location: [facility];  Service: Cardiovascular;  Laterality: Right;   TONSILLECTOMY     VASCULAR SURGERY      Social History Social History   Tobacco Use   Smoking status: Former    Current packs/day: 0.00    Average packs/day: 1 pack/day for 35.0 years (35.0 ttl pk-yrs)    Types: Cigarettes    Start date: 24    Quit date: 2021    Years since quitting: 4.8    Passive exposure: Past   Smokeless tobacco: Never  Vaping Use   Vaping status: Never Used  Substance Use Topics   Alcohol use: Yes    Comment: rarely   Drug use:  No    Family History Family History  Problem Relation Age of Onset   Cancer Father    Heart attack Brother        MI @ 60    Allergies  Allergen Reactions   Naproxen Anaphylaxis   Liraglutide Nausea Only    (Victoza)     REVIEW OF SYSTEMS (Negative unless checked)  Constitutional: [] Weight loss  [] Fever  [] Chills Cardiac: [] Chest pain   [] Chest pressure   [] Palpitations   [] Shortness of breath when laying flat   []   Shortness of breath with exertion. Vascular:  [x] Pain in legs with walking   [] Pain in legs at rest  [] History of DVT   [] Phlebitis   [] Swelling in legs   [] Varicose veins   [] Non-healing ulcers Pulmonary:   [] Uses home oxygen   [] Productive cough   [] Hemoptysis   [] Wheeze  [] COPD   [] Asthma Neurologic:  [] Dizziness   [] Seizures   [] History of stroke   [] History of TIA  [] Aphasia   [] Vissual changes   [] Weakness or numbness in arm   [] Weakness or numbness in leg Musculoskeletal:   [] Joint swelling   [] Joint pain   [] Low back pain Hematologic:  [] Easy bruising  [] Easy bleeding   [] Hypercoagulable state   [] Anemic Gastrointestinal:  [] Diarrhea   [] Vomiting  [] Gastroesophageal reflux/heartburn   [] Difficulty swallowing. Genitourinary:  [] Chronic kidney disease   [] Difficult urination  [] Frequent urination   [] Blood in urine Skin:  [] Rashes   [] Ulcers  Psychological:  [] History of anxiety   []  History of major depression.  Physical Examination  Vitals:   07/04/24 0844  BP: (!) 149/81  Pulse: 65  Resp: 16  Weight: 195 lb 12.8 oz (88.8 kg)  Height: 5' 8 (1.727 m)   Body mass index is 29.77 kg/m. Gen: WD/WN, NAD Head: [place]/AT, No temporalis wasting.  Ear/Nose/Throat: Hearing grossly intact, nares w/o erythema or drainage Eyes: PER, EOMI, sclera nonicteric.  Neck: Supple, no masses.  No bruit or JVD.  Pulmonary:  Good air movement, no audible wheezing, no use of accessory muscles.  Cardiac: RRR, normal S1, S2, no Murmurs. Vascular:  mild trophic changes, no open  wounds Vessel Right Left  Radial Palpable Palpable  PT Not Palpable Not Palpable  DP Not Palpable Not Palpable  Gastrointestinal: soft, non-distended. No guarding/no peritoneal signs.  Musculoskeletal: M/S 5/5 throughout.  No visible deformity.  Neurologic: CN 2-12 intact. Pain and light touch intact in extremities.  Symmetrical.  Speech is fluent. Motor exam as listed above. Psychiatric: Judgment intact, Mood & affect appropriate for pt's clinical situation. Dermatologic: No rashes or ulcers noted.  No changes consistent with cellulitis.   CBC Lab Results  Component Value Date   WBC 6.0 01/05/2023   HGB 13.2 01/05/2023   HCT 38.2 (L) 01/05/2023   MCV 88.0 01/05/2023   PLT 235 01/05/2023    BMET    Component Value Date/Time   NA 140 01/06/2023 0446   NA 141 09/10/2020 1124   K 3.8 01/06/2023 0446   CL 104 01/06/2023 0446   CO2 27 01/06/2023 0446   GLUCOSE 189 (H) 01/06/2023 0446   BUN 8 01/06/2023 0446   BUN 13 09/10/2020 1124   CREATININE 0.46 (L) 01/06/2023 0446   CALCIUM  8.3 (L) 01/06/2023 0446   GFRNONAA >60 01/06/2023 0446   GFRAA 102 09/10/2020 1124   CrCl cannot be calculated (Patient's most recent lab result is older than the maximum 21 days allowed.).  COAG Lab Results  Component Value Date   INR 1.0 07/16/2022    Radiology No results found.   Assessment/Plan 1. Atherosclerosis of native artery of right lower extremity with intermittent claudication (Primary) Recommend:  The patient has experienced increased claudication symptoms and is now describing lifestyle limiting claudication and appears to be having mild rest pain symptoms (given the numbness and aching of the right foot.  Also the severity of the SFA lesion places him at high risk for occlusion and subsequent limb ischemia and possible limb loss.  Given the severity of the patient's severe  right lower extremity symptoms the patient should undergo angiography with the hope for intervention.   Risk and benefits were reviewed the patient.  Indications for the procedure were reviewed.  All questions were answered, the patient agrees to proceed with right lower extremity angiography and possible intervention.   The patient should continue walking and begin a more formal exercise program.  The patient should continue antiplatelet therapy and aggressive treatment of the lipid abnormalities  The patient will follow up with me after the angiogram.   I70.229    Atherosclerotic occlusive disease with rest pain I70.21      Atherosclerotic occlusive disease with claudication  CPT codes: 62773   stent placement femoral-popliteal artery 36247   introduction catheter below diaphragm third order  2. Bilateral carotid artery stenosis Recommend:  Given the patient's asymptomatic subcritical stenosis no further invasive testing or surgery at this time.  Duplex ultrasound of the carotid arteries obtained today demonstrates RICA 40-59% and LICA 40-59%. No change compared to previous study  Continue antiplatelet therapy as prescribed Continue management of CAD, HTN and Hyperlipidemia Healthy heart diet,  encouraged exercise at least 4 times per week  Follow up in 12 months with duplex ultrasound and physical exam   3. Essential hypertension Continue antihypertensive medications as already ordered, these medications have been reviewed and there are no changes at this time.  4. Type 2 diabetes mellitus with peripheral neuropathy (HCC) Continue hypoglycemic medications as already ordered, these medications have been reviewed and there are no changes at this time.  Hgb A1C to be monitored as already arranged by primary service  5. Mixed hyperlipidemia Continue statin as ordered and reviewed, no changes at this time    [name], MD  07/04/2024 8:52 AM

## 2024-07-04 NOTE — Progress Notes (Signed)
 MRN : [id number]  Justin Adkins is a 58 y.o. (March 21, 1966) male who presents with chief complaint of check circulation.  History of Present Illness:   The patient returns to the office for followup and review of the noninvasive studies.    There has been an interval changes in lower extremity symptoms. He notes shortening of the patient's claudication distance but no development of rest pain symptoms. He notes his right foot feels numb and tight/ aches.  No new ulcers or wounds have occurred since the last visit.   He is also followed for carotid stenosis.   The patient denies amaurosis fugax or recent TIA symptoms. There are no documented recent neurological changes noted. There is no history of DVT, PE or superficial thrombophlebitis. The patient denies recent episodes of angina or shortness of breath.    ABI Rt=0.91 and Lt=1.07  (previous ABI's Rt=1.14 and Lt=1.28).   Duplex ultrasound of the right lower extremity shows primarily biphasic/triphasic waveforms throughout.  There is now a 75-99% stenosis in the mid SFA (PSV 402 cm/sec) and the previous 50 to 74% stenosis noted in the popliteal artery.   Duplex ultrasound of the carotid arteries obtained today demonstrates RICA 40-59% and LICA 40-59%. No change compared to previous study  Current Meds  Medication Sig   atorvastatin  (LIPITOR) 20 MG tablet Take 1 tablet (20 mg total) by mouth daily. (Patient taking differently: Take 20 mg by mouth every morning.)   Blood Glucose Monitoring Suppl DEVI 1 each by Does not apply route 3 (three) times daily. May dispense any manufacturer covered by patient's insurance.   Continuous Glucose Sensor (DEXCOM G7 SENSOR) MISC    Continuous Glucose Sensor (DEXCOM G7 SENSOR) MISC USE 1 EACH EVERY 10 (TEN) DAYS   docusate sodium  (COLACE) 100 MG capsule Take 100 mg by mouth 2 (two) times daily as needed for mild constipation.    ergocalciferol  (VITAMIN D2) 1.25 MG (50000 UT) capsule Take 50,000 Units by mouth every Saturday.   FARXIGA 10 MG TABS tablet Take 10 mg by mouth daily.   gabapentin  (NEURONTIN ) 300 MG capsule Take 1 cap po a night x 2 days and then may increase to q12 hr thereafter.   Glucagon  (GVOKE HYPOPEN  2-PACK) 1 MG/0.2ML SOAJ Inject 1 mg into the skin as needed for up to 2 doses (Severe low blood sugar).   Glucose Blood (BLOOD GLUCOSE TEST STRIPS) STRP 1 each by Does not apply route 3 (three) times daily. Use as directed to check blood sugar. May dispense any manufacturer covered by patient's insurance and fits patient's device.   Insulin  Disposable Pump (OMNIPOD 5 G7 PODS, GEN 5,) MISC Inject into the skin.   Insulin  Pen Needle (PEN NEEDLES) 31G X 5 MM MISC 1 each by Does not apply route 3 (three) times daily. May dispense any manufacturer covered by patient's insurance.   ketoconazole (NIZORAL) 2 % shampoo Apply 1 Application topically 3 (three) times a week.   Lancet Device MISC 1 each by Does not apply route 3 (three) times daily. May dispense any manufacturer covered by patient's insurance.  Lancets MISC 1 each by Does not apply route 3 (three) times daily. Use as directed to check blood sugar. May dispense any manufacturer covered by patient's insurance and fits patient's device.   pantoprazole  (PROTONIX ) 40 MG tablet Take 1 tablet (40 mg total) by mouth daily.   rivaroxaban  (XARELTO ) 2.5 MG TABS tablet Take 1 tablet (2.5 mg total) by mouth 2 (two) times daily.   Semaglutide, 1 MG/DOSE, (OZEMPIC, 1 MG/DOSE,) 2 MG/1.5ML SOPN Inject 1 mg into the skin once a week. On Saturdays    Past Medical History:  Diagnosis Date   Atherosclerosis of native arteries of extremity with intermittent claudication    ED (erectile dysfunction)    Elevated liver enzymes    GERD (gastroesophageal reflux disease)    History of echocardiogram    a. 07/2020 Echo: EF 60-65%, no rwma, mild LVH. Nl RV fxn. Mildly dil LA.    History of tobacco abuse    Hyperlipidemia    Hypertension    Peripheral vascular disease    a. 05/2019 s/p PTA R popliteal/AT, mech thrombectomy to R popliteal/tibioperoneal trunk--> plavix  & eliquis  2.5 bid since; 09/2019 LE u/s: near nl study.   Popliteal artery occlusion, right    Type 2 diabetes mellitus (HCC)    Ulcerative colitis [facility])     Past Surgical History:  Procedure Laterality Date   COLONOSCOPY     COLONOSCOPY N/A 09/24/2020   Procedure: COLONOSCOPY;  Surgeon: Maryruth Ole DASEN, MD;  Location: [facility];  Service: Endoscopy;  Laterality: N/A;   COLONOSCOPY WITH PROPOFOL  N/A 11/07/2022   Procedure: COLONOSCOPY WITH PROPOFOL ;  Surgeon: Maryruth Ole DASEN, MD;  Location: [facility];  Service: Endoscopy;  Laterality: N/A;   ESOPHAGOGASTRODUODENOSCOPY (EGD) WITH PROPOFOL  N/A 02/28/2020   Procedure: ESOPHAGOGASTRODUODENOSCOPY (EGD) WITH PROPOFOL ;  Surgeon: Maryruth Ole DASEN, MD;  Location: [facility];  Service: Endoscopy;  Laterality: N/A;   FOOT SURGERY Right    HERNIA REPAIR     inguinal hernia   LOWER EXTREMITY ANGIOGRAPHY Right 05/25/2019   Procedure: LOWER EXTREMITY ANGIOGRAPHY;  Surgeon: Jama Cordella MATSU, MD;  Location: [facility];  Service: Cardiovascular;  Laterality: Right;   LOWER EXTREMITY ANGIOGRAPHY Right 07/02/2021   Procedure: LOWER EXTREMITY ANGIOGRAPHY;  Surgeon: Jama Cordella MATSU, MD;  Location: [facility];  Service: Cardiovascular;  Laterality: Right;   TONSILLECTOMY     VASCULAR SURGERY      Social History Social History   Tobacco Use   Smoking status: Former    Current packs/day: 0.00    Average packs/day: 1 pack/day for 35.0 years (35.0 ttl pk-yrs)    Types: Cigarettes    Start date: 24    Quit date: 2021    Years since quitting: 4.8    Passive exposure: Past   Smokeless tobacco: Never  Vaping Use   Vaping status: Never Used  Substance Use Topics   Alcohol use: Yes    Comment: rarely   Drug use:  No    Family History Family History  Problem Relation Age of Onset   Cancer Father    Heart attack Brother        MI @ 60    Allergies  Allergen Reactions   Naproxen Anaphylaxis   Liraglutide Nausea Only    (Victoza)     REVIEW OF SYSTEMS (Negative unless checked)  Constitutional: [] Weight loss  [] Fever  [] Chills Cardiac: [] Chest pain   [] Chest pressure   [] Palpitations   [] Shortness of breath when laying flat   []   Shortness of breath with exertion. Vascular:  [x] Pain in legs with walking   [] Pain in legs at rest  [] History of DVT   [] Phlebitis   [] Swelling in legs   [] Varicose veins   [] Non-healing ulcers Pulmonary:   [] Uses home oxygen   [] Productive cough   [] Hemoptysis   [] Wheeze  [] COPD   [] Asthma Neurologic:  [] Dizziness   [] Seizures   [] History of stroke   [] History of TIA  [] Aphasia   [] Vissual changes   [] Weakness or numbness in arm   [] Weakness or numbness in leg Musculoskeletal:   [] Joint swelling   [] Joint pain   [] Low back pain Hematologic:  [] Easy bruising  [] Easy bleeding   [] Hypercoagulable state   [] Anemic Gastrointestinal:  [] Diarrhea   [] Vomiting  [] Gastroesophageal reflux/heartburn   [] Difficulty swallowing. Genitourinary:  [] Chronic kidney disease   [] Difficult urination  [] Frequent urination   [] Blood in urine Skin:  [] Rashes   [] Ulcers  Psychological:  [] History of anxiety   []  History of major depression.  Physical Examination  Vitals:   07/04/24 0844  BP: (!) 149/81  Pulse: 65  Resp: 16  Weight: 195 lb 12.8 oz (88.8 kg)  Height: 5' 8 (1.727 m)   Body mass index is 29.77 kg/m. Gen: WD/WN, NAD Head: [place]/AT, No temporalis wasting.  Ear/Nose/Throat: Hearing grossly intact, nares w/o erythema or drainage Eyes: PER, EOMI, sclera nonicteric.  Neck: Supple, no masses.  No bruit or JVD.  Pulmonary:  Good air movement, no audible wheezing, no use of accessory muscles.  Cardiac: RRR, normal S1, S2, no Murmurs. Vascular:  mild trophic changes, no open  wounds Vessel Right Left  Radial Palpable Palpable  PT Not Palpable Not Palpable  DP Not Palpable Not Palpable  Gastrointestinal: soft, non-distended. No guarding/no peritoneal signs.  Musculoskeletal: M/S 5/5 throughout.  No visible deformity.  Neurologic: CN 2-12 intact. Pain and light touch intact in extremities.  Symmetrical.  Speech is fluent. Motor exam as listed above. Psychiatric: Judgment intact, Mood & affect appropriate for pt's clinical situation. Dermatologic: No rashes or ulcers noted.  No changes consistent with cellulitis.   CBC Lab Results  Component Value Date   WBC 6.0 01/05/2023   HGB 13.2 01/05/2023   HCT 38.2 (L) 01/05/2023   MCV 88.0 01/05/2023   PLT 235 01/05/2023    BMET    Component Value Date/Time   NA 140 01/06/2023 0446   NA 141 09/10/2020 1124   K 3.8 01/06/2023 0446   CL 104 01/06/2023 0446   CO2 27 01/06/2023 0446   GLUCOSE 189 (H) 01/06/2023 0446   BUN 8 01/06/2023 0446   BUN 13 09/10/2020 1124   CREATININE 0.46 (L) 01/06/2023 0446   CALCIUM  8.3 (L) 01/06/2023 0446   GFRNONAA >60 01/06/2023 0446   GFRAA 102 09/10/2020 1124   CrCl cannot be calculated (Patient's most recent lab result is older than the maximum 21 days allowed.).  COAG Lab Results  Component Value Date   INR 1.0 07/16/2022    Radiology No results found.   Assessment/Plan 1. Atherosclerosis of native artery of right lower extremity with intermittent claudication (Primary) Recommend:  The patient has experienced increased claudication symptoms and is now describing lifestyle limiting claudication and appears to be having mild rest pain symptoms (given the numbness and aching of the right foot.  Also the severity of the SFA lesion places him at high risk for occlusion and subsequent limb ischemia and possible limb loss.  Given the severity of the patient's severe  right lower extremity symptoms the patient should undergo angiography with the hope for intervention.   Risk and benefits were reviewed the patient.  Indications for the procedure were reviewed.  All questions were answered, the patient agrees to proceed with right lower extremity angiography and possible intervention.   The patient should continue walking and begin a more formal exercise program.  The patient should continue antiplatelet therapy and aggressive treatment of the lipid abnormalities  The patient will follow up with me after the angiogram.   I70.229    Atherosclerotic occlusive disease with rest pain I70.21      Atherosclerotic occlusive disease with claudication  CPT codes: 62773   stent placement femoral-popliteal artery 36247   introduction catheter below diaphragm third order  2. Bilateral carotid artery stenosis Recommend:  Given the patient's asymptomatic subcritical stenosis no further invasive testing or surgery at this time.  Duplex ultrasound of the carotid arteries obtained today demonstrates RICA 40-59% and LICA 40-59%. No change compared to previous study  Continue antiplatelet therapy as prescribed Continue management of CAD, HTN and Hyperlipidemia Healthy heart diet,  encouraged exercise at least 4 times per week  Follow up in 12 months with duplex ultrasound and physical exam   3. Essential hypertension Continue antihypertensive medications as already ordered, these medications have been reviewed and there are no changes at this time.  4. Type 2 diabetes mellitus with peripheral neuropathy (HCC) Continue hypoglycemic medications as already ordered, these medications have been reviewed and there are no changes at this time.  Hgb A1C to be monitored as already arranged by primary service  5. Mixed hyperlipidemia Continue statin as ordered and reviewed, no changes at this time    [name], MD  07/04/2024 8:52 AM

## 2024-07-06 LAB — VAS US ABI WITH/WO TBI
Left ABI: 1.07
Right ABI: 0.91

## 2024-07-13 ENCOUNTER — Other Ambulatory Visit: Payer: Self-pay

## 2024-07-13 ENCOUNTER — Encounter
Admission: RE | Disposition: A | Discharge status: Home / Self Care | Payer: Self-pay | Source: Home / Self Care | Attending: Vascular Surgery

## 2024-07-13 ENCOUNTER — Ambulatory Visit
Admission: RE | Admit: 2024-07-13 | Discharge: 2024-07-13 | Disposition: A | Discharge status: Home / Self Care | Attending: Vascular Surgery | Admitting: Vascular Surgery

## 2024-07-13 ENCOUNTER — Encounter: Payer: Self-pay | Admitting: Vascular Surgery

## 2024-07-13 DIAGNOSIS — E782 Mixed hyperlipidemia: Secondary | ICD-10-CM | POA: Diagnosis not present

## 2024-07-13 DIAGNOSIS — E1142 Type 2 diabetes mellitus with diabetic polyneuropathy: Secondary | ICD-10-CM | POA: Insufficient documentation

## 2024-07-13 DIAGNOSIS — I10 Essential (primary) hypertension: Secondary | ICD-10-CM | POA: Diagnosis not present

## 2024-07-13 DIAGNOSIS — I6523 Occlusion and stenosis of bilateral carotid arteries: Secondary | ICD-10-CM | POA: Insufficient documentation

## 2024-07-13 DIAGNOSIS — I70211 Atherosclerosis of native arteries of extremities with intermittent claudication, right leg: Secondary | ICD-10-CM | POA: Insufficient documentation

## 2024-07-13 DIAGNOSIS — Z87891 Personal history of nicotine dependence: Secondary | ICD-10-CM | POA: Insufficient documentation

## 2024-07-13 DIAGNOSIS — Z7985 Long-term (current) use of injectable non-insulin antidiabetic drugs: Secondary | ICD-10-CM | POA: Insufficient documentation

## 2024-07-13 DIAGNOSIS — I70219 Atherosclerosis of native arteries of extremities with intermittent claudication, unspecified extremity: Secondary | ICD-10-CM | POA: Diagnosis present

## 2024-07-13 DIAGNOSIS — I70229 Atherosclerosis of native arteries of extremities with rest pain, unspecified extremity: Secondary | ICD-10-CM

## 2024-07-13 HISTORY — PX: LOWER EXTREMITY INTERVENTION: CATH118252

## 2024-07-13 HISTORY — PX: LOWER EXTREMITY ANGIOGRAPHY: CATH118251

## 2024-07-13 LAB — BUN: BUN: 16 mg/dL (ref 6–20)

## 2024-07-13 LAB — CREATININE, SERUM
Creatinine, Ser: 0.9 mg/dL (ref 0.61–1.24)
GFR, Estimated: >60 mL/min (ref >60)

## 2024-07-13 LAB — GLUCOSE, CAPILLARY
Glucose-Capillary: 132 mg/dL — ABNORMAL HIGH (ref 70–99)
Glucose-Capillary: 152 mg/dL — ABNORMAL HIGH (ref 70–99)

## 2024-07-13 SURGERY — LOWER EXTREMITY INTERVENTION
Anesthesia: Moderate Sedation | Site: Leg Lower | Laterality: Right

## 2024-07-13 MED ORDER — SODIUM CHLORIDE 0.9% FLUSH: 3.0000 mL | INTRAVENOUS | Status: DC | PRN

## 2024-07-13 MED ORDER — SODIUM CHLORIDE 0.9% FLUSH: 3.0000 mL | Freq: Two times a day (BID) | INTRAVENOUS | Status: DC

## 2024-07-13 MED ORDER — DIPHENHYDRAMINE HCL 50 MG/ML IJ SOLN: 50.0000 mg | Freq: Once | INTRAMUSCULAR | Status: DC | PRN

## 2024-07-13 MED ORDER — MIDAZOLAM HCL 2 MG/2ML IJ SOLN
INTRAMUSCULAR | Status: AC
  Filled 2024-07-13: qty 2

## 2024-07-13 MED ORDER — CEFAZOLIN SODIUM-DEXTROSE 2-4 GM/100ML-% IV SOLN
INTRAVENOUS | Status: AC
Start: 2024-07-13 — End: 2024-07-13
  Filled 2024-07-13: qty 100

## 2024-07-13 MED ORDER — ACETAMINOPHEN 325 MG PO TABS
650.0000 mg | ORAL_TABLET | ORAL | Status: DC | PRN
Start: 2024-07-13 — End: 2024-07-13

## 2024-07-13 MED ORDER — FENTANYL CITRATE (PF) 100 MCG/2ML IJ SOLN
INTRAMUSCULAR | Status: AC
  Filled 2024-07-13: qty 2

## 2024-07-13 MED ORDER — CEFAZOLIN SODIUM-DEXTROSE 2-4 GM/100ML-% IV SOLN
2.0000 g | INTRAVENOUS | Status: AC
  Administered 2024-07-13: 2 g via INTRAVENOUS

## 2024-07-13 MED ORDER — SODIUM CHLORIDE 0.9 % IV SOLN: 250.0000 mL | INTRAVENOUS | Status: DC | PRN

## 2024-07-13 MED ORDER — MIDAZOLAM HCL (PF) 2 MG/2ML IJ SOLN
INTRAMUSCULAR | Status: DC | PRN
  Administered 2024-07-13: 1 mg via INTRAVENOUS
  Administered 2024-07-13: 2 mg via INTRAVENOUS

## 2024-07-13 MED ORDER — FENTANYL CITRATE (PF) 100 MCG/2ML IJ SOLN
INTRAMUSCULAR | Status: DC | PRN
  Administered 2024-07-13: 50 ug via INTRAVENOUS
  Administered 2024-07-13: 25 ug via INTRAVENOUS

## 2024-07-13 MED ORDER — HEPARIN (PORCINE) IN NACL 2000-0.9 UNIT/L-% IV SOLN
INTRAVENOUS | Status: DC | PRN
  Administered 2024-07-13: 1000 mL

## 2024-07-13 MED ORDER — HYDROMORPHONE HCL 1 MG/ML IJ SOLN: 1.0000 mg | Freq: Once | INTRAMUSCULAR | Status: DC | PRN

## 2024-07-13 MED ORDER — HEPARIN SODIUM (PORCINE) 1000 UNIT/ML IJ SOLN
INTRAMUSCULAR | Status: AC
Start: 2024-07-13 — End: 2024-07-13
  Filled 2024-07-13: qty 10

## 2024-07-13 MED ORDER — OXYCODONE HCL 5 MG PO TABS: 5.0000 mg | ORAL_TABLET | ORAL | Status: DC | PRN

## 2024-07-13 MED ORDER — HEPARIN SODIUM (PORCINE) 1000 UNIT/ML IJ SOLN
INTRAMUSCULAR | Status: DC | PRN
  Administered 2024-07-13: 6000 [IU] via INTRAVENOUS

## 2024-07-13 MED ORDER — IODIXANOL 320 MG/ML IV SOLN
INTRAVENOUS | Status: DC | PRN
  Administered 2024-07-13: 45 mL

## 2024-07-13 MED ORDER — SODIUM CHLORIDE 0.9 % IV SOLN: INTRAVENOUS | Status: DC

## 2024-07-13 MED ORDER — LIDOCAINE HCL (PF) 1 % IJ SOLN
INTRAMUSCULAR | Status: DC | PRN
  Administered 2024-07-13: 10 mL

## 2024-07-13 MED ORDER — ONDANSETRON HCL 4 MG/2ML IJ SOLN: 4.0000 mg | Freq: Four times a day (QID) | INTRAMUSCULAR | Status: DC | PRN

## 2024-07-13 MED ORDER — METHYLPREDNISOLONE SODIUM SUCC 125 MG IJ SOLR: 125.0000 mg | Freq: Once | INTRAMUSCULAR | Status: DC | PRN

## 2024-07-13 MED ORDER — LABETALOL HCL 5 MG/ML IV SOLN: 10.0000 mg | INTRAVENOUS | Status: DC | PRN

## 2024-07-13 MED ORDER — ATORVASTATIN CALCIUM 20 MG PO TABS: 20.0000 mg | ORAL_TABLET | ORAL | 5 refills | Status: AC

## 2024-07-13 MED ORDER — MIDAZOLAM HCL 2 MG/ML PO SYRP: 8.0000 mg | ORAL_SOLUTION | Freq: Once | ORAL | Status: DC | PRN

## 2024-07-13 MED ORDER — HYDRALAZINE HCL 20 MG/ML IJ SOLN: 5.0000 mg | INTRAMUSCULAR | Status: DC | PRN

## 2024-07-13 MED ORDER — FAMOTIDINE 20 MG PO TABS: 40.0000 mg | ORAL_TABLET | Freq: Once | ORAL | Status: DC | PRN

## 2024-07-13 MED ORDER — MORPHINE SULFATE (PF) 2 MG/ML IV SOLN: 2.0000 mg | INTRAVENOUS | Status: DC | PRN

## 2024-07-13 SURGICAL SUPPLY — 20 items
BALLOON LUTONIX DCB 5X40X130 (BALLOONS) IMPLANT
BALLOON LUTONIX DCB 5X60X130 (BALLOONS) IMPLANT
CATH ANGIO 5F PIGTAIL 65CM (CATHETERS) IMPLANT
CATH BEACON 5 .038 100 VERT TP (CATHETERS) IMPLANT
COVER PROBE ULTRASOUND 5X96 (MISCELLANEOUS) IMPLANT
DEVICE PRESTO INFLATION (MISCELLANEOUS) IMPLANT
DEVICE STARCLOSE SE CLOSURE (Vascular Products) IMPLANT
GLIDEWIRE ADV .035X260CM (WIRE) IMPLANT
GOWN STRL REUS W/ TWL LRG LVL3 (GOWN DISPOSABLE) ×1 IMPLANT
NDL ENTRY 21GA 7CM ECHOTIP (NEEDLE) IMPLANT
PACK ANGIOGRAPHY (CUSTOM PROCEDURE TRAY) ×1 IMPLANT
SET INTRO CAPELLA COAXIAL (SET/KITS/TRAYS/PACK) IMPLANT
SHEATH BRITE TIP 5FRX11 (SHEATH) IMPLANT
SHEATH RAABE 6FR (SHEATH) IMPLANT
STENT LIFESTENT 5F 6X30X135 (Permanent Stent) IMPLANT
STENT LIFESTENT 5F 6X60X135 (Permanent Stent) IMPLANT
SYR MEDRAD MARK 7 150ML (SYRINGE) IMPLANT
TUBING CONTRAST HIGH PRESS 72 (TUBING) IMPLANT
WIRE J 3MM .035X145CM (WIRE) IMPLANT
WIRE SUPRACORE 300CM (WIRE) IMPLANT

## 2024-07-13 NOTE — Op Note (Signed)
 [facility]  Percutaneous Study/Intervention Procedural Note   Date of Surgery: 07/13/2024  Surgeon:  Cordella JUDITHANN Shawl, MD.  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  Introduction catheter into right lower extremity 3rd order catheter placement              2.    Contrast injection right lower extremity for distal runoff             3.  Percutaneous transluminal angioplasty and stent placement right superficial femoral artery and popliteal in 2 separate locations             4.  Star close closure left common femoral arteriotomy  Anesthesia: Conscious sedation was administered under my direct supervision by the interventional radiology RN. IV Versed  plus fentanyl  were utilized. Continuous ECG, pulse oximetry and blood pressure was monitored throughout the entire procedure.  Conscious sedation was for a total of 34 minutes.  Sheath: 6 French Rabie left common femoral retrograde  Contrast: 45 cc  Fluoroscopy Time: 5.2 minutes  Indications:  Justin Adkins presents with worsening claudication symptoms.  He is now experiencing difficulties at work and with his daily activities.  Angiography with hope for intervention has been recommended.  The risks and benefits are reviewed all questions answered patient agrees to proceed.  Procedure:  Justin Adkins is a 58 y.o. y.o. male who was identified and appropriate procedural time out was performed.  The patient was then placed supine on the table and prepped and draped in the usual sterile fashion.    Ultrasound was placed in the sterile sleeve and the left groin was evaluated the left common femoral artery was echolucent and pulsatile indicating patency.  Image was recorded for the permanent record and under real-time visualization a microneedle was inserted into the  common femoral artery microwire followed by a micro-sheath.  A J-wire was then advanced through the micro-sheath and a  5 French sheath was then inserted over a J-wire. J-wire was then advanced and a 5 French pigtail catheter was positioned at the level of T12. AP projection of the aorta was then obtained. Pigtail catheter was repositioned to above the bifurcation and a LAO view of the pelvis was obtained.  Subsequently a pigtail catheter with the stiff angle Glidewire was used to cross the aortic bifurcation the catheter wire were advanced down into the right distal external iliac artery. Oblique view of the femoral bifurcation was then obtained and subsequently the wire was reintroduced and the pigtail catheter negotiated into the SFA representing third order catheter placement. Distal runoff was then performed.  6000 units of heparin  was then given and allowed to circulate and a 6 French Rabie sheath was advanced up and over the bifurcation and positioned in the femoral artery  KMP  catheter and supra core wire were then negotiated down into the distal popliteal.  A 6 mm x 30 mm life stent was then deployed across the proximal lesion in the SFA and postdilated with a 5 mm x 40 mm Lutonix drug-eluting balloon.  Inflation was to 12 atmospheres for 1 minute. Follow-up imaging demonstrated patency with less than 10% residual stenosis.  Attention was then turned to the lesion in the mid popliteal artery essentially at the level of the joint space.  A 6 mm x 60 mm life stent  was deployed across this lesion and then postdilated with a 5 mm x 60 mm Lutonix drug-eluting balloon inflated to 8 atm for 1 minute.  Follow-up imaging demonstrated less than 10% residual stenosis.  Distal runoff was then assessed.  After review of these images the sheath is pulled into the left external iliac oblique of the common femoral is obtained and a Star close device deployed. There no immediate complications.   Findings:  The  abdominal aorta is opacified with a bolus injection contrast. Renal arteries are single and widely patent. The aorta itself has diffuse disease but no hemodynamically significant lesions. The common and external iliac arteries are widely patent bilaterally.  The right common femoral is widely patent as is the profunda femoris.  The SFA does indeed have a significant stenosis in its proximal one third this lesion is focal and greater than 80%..  The above-knee popliteal is free of hemodynamically significant stenosis but in the midportion of the popliteal at the level of the joint space there is a second 80% stenosis.  This is slightly longer than the more proximal lesion.  Distal to this lesion the below-knee popliteal is widely patent and free of hemodynamically significant stenosis.  The trifurcation is patent.  However there is diffuse disease in the peroneal and posterior tibial although the posterior tibial is patent down to the foot.  The anterior tibial is the dominant runoff to the foot there is a moderate focal lesion in its midportion but otherwise it is widely patent.    Following angioplasty and stent placement in the proximal SFA in the mid popliteal there is now wide patency with less than 10% residual stenosis.      Summary: Successful recanalization right lower extremity for limb salvage                        Disposition: Patient was taken to the recovery room in stable condition having tolerated the procedure well.  Justin Adkins, Cordella MATSU 07/13/2024,8:58 AM

## 2024-07-13 NOTE — Interval H&P Note (Signed)
 History and Physical Interval Note:  07/13/2024 8:06 AM  Justin Adkins  has presented today for surgery, with the diagnosis of RLE Angio   ASO w rest pain and claudication.  The various methods of treatment have been discussed with the patient and family. After consideration of risks, benefits and other options for treatment, the patient has consented to  Procedure(s): LOWER EXTREMITY INTERVENTION (Right) Lower Extremity Angiography (Right) as a surgical intervention.  The patient's history has been reviewed, patient examined, no change in status, stable for surgery.  I have reviewed the patient's chart and labs.  Questions were answered to the patient's satisfaction.     [name]

## 2024-08-08 ENCOUNTER — Other Ambulatory Visit (INDEPENDENT_AMBULATORY_CARE_PROVIDER_SITE_OTHER): Payer: Self-pay | Admitting: Vascular Surgery

## 2024-08-08 DIAGNOSIS — Z9889 Other specified postprocedural states: Secondary | ICD-10-CM

## 2024-08-15 ENCOUNTER — Ambulatory Visit (INDEPENDENT_AMBULATORY_CARE_PROVIDER_SITE_OTHER): Admitting: Vascular Surgery

## 2024-08-15 ENCOUNTER — Encounter (INDEPENDENT_AMBULATORY_CARE_PROVIDER_SITE_OTHER)

## 2024-09-04 NOTE — Progress Notes (Unsigned)
 "                                                                      MRN : [id number]  Justin Adkins is a 59 y.o. (12/27/1965) male who presents with chief complaint of check circulation.  History of Present Illness:   The patient returns to the office for followup and review status post angiogram with intervention on 07/13/2024.   Procedure:  Percutaneous transluminal angioplasty and stent placement right superficial femoral artery and popliteal in 2 separate locations   The patient notes improvement in the lower extremity symptoms. No interval shortening of the patient's claudication distance or rest pain symptoms. No new ulcers or wounds have occurred since the last visit.  There have been no significant changes to the patient's overall health care.  No documented history of amaurosis fugax or recent TIA symptoms. There are no recent neurological changes noted. No documented history of DVT, PE or superficial thrombophlebitis. The patient denies recent episodes of angina or shortness of breath.   ABI's Rt=*** and Lt=***  (previous ABI's Rt=*** and Lt=***) Duplex US  of the *** lower extremity arterial system shows ***  Duplex ultrasound of the carotid arteries dated 07/04/2024 demonstrates 40-59% bilateral ICA stenosis.  Active Medications[1]  Past Medical History:  Diagnosis Date   Atherosclerosis of native arteries of extremity with intermittent claudication    ED (erectile dysfunction)    Elevated liver enzymes    GERD (gastroesophageal reflux disease)    History of echocardiogram    a. 07/2020 Echo: EF 60-65%, no rwma, mild LVH. Nl RV fxn. Mildly dil LA.   History of tobacco abuse    Hyperlipidemia    Hypertension    Peripheral vascular disease    a. 05/2019 s/p PTA R popliteal/AT, mech thrombectomy to R popliteal/tibioperoneal trunk--> plavix  & eliquis  2.5 bid since; 09/2019 LE u/s: near nl study.   Popliteal artery occlusion, right    Type 2 diabetes mellitus (HCC)     Ulcerative colitis [facility])     Past Surgical History:  Procedure Laterality Date   COLONOSCOPY     COLONOSCOPY N/A 09/24/2020   Procedure: COLONOSCOPY;  Surgeon: Maryruth Ole DASEN, MD;  Location: [facility] END[facility];  Service: Endoscopy;  Laterality: N/A;   COLONOSCOPY WITH PROPOFOL  N/A 11/07/2022   Procedure: COLONOSCOPY WITH PROPOFOL ;  Surgeon: Maryruth Ole DASEN, MD;  Location: [facility] ENDOS[facility];  Service: Endoscopy;  Laterality: N/A;   ESOPHAGOGASTRODUODENOSCOPY (EGD) WITH PROPOFOL  N/A 02/28/2020   Procedure: ESOPHAGOGASTRODUODENOSCOPY (EGD) WITH PROPOFOL ;  Surgeon: Maryruth Ole DASEN, MD;  Location: [facility] ENDOSCOPY;  Service: Endoscopy;  Laterality: N/A;   FOOT SURGERY Right    HERNIA REPAIR     inguinal hernia   LOWER EXTREMITY ANGIOGRAPHY Right 05/25/2019   Procedure: LOWER EXTREMITY ANGIOGRAPHY;  Surgeon: Jama Cordella MATSU, MD;  Location: [facility];  Service: Cardiovascular;  Laterality: Right;   LOWER EXTREMITY ANGIOGRAPHY Right 07/02/2021   Procedure: LOWER EXTREMITY ANGIOGRAPHY;  Surgeon: Jama Cordella MATSU, MD;  Location: [facility];  Service: Cardiovascular;  Laterality: Right;   LOWER EXTREMITY ANGIOGRAPHY Right 07/13/2024   Procedure: Lower Extremity Angiography;  Surgeon: Jama Cordella MATSU, MD;  Location: [facility];  Service: Cardiovascular;  Laterality: Right;  LOWER EXTREMITY INTERVENTION Right 07/13/2024   Procedure: LOWER EXTREMITY INTERVENTION;  Surgeon: Jama Cordella MATSU, MD;  Location: [facility];  Service: Cardiovascular;  Laterality: Right;   TONSILLECTOMY     VASCULAR SURGERY      Social History Social History[2]  Family History Family History  Problem Relation Age of Onset   Cancer Father    Heart attack Brother        MI @ 98    Allergies[3]   REVIEW OF SYSTEMS (Negative unless checked)  Constitutional: [] Weight loss  [] Fever  [] Chills Cardiac: [] Chest pain   [] Chest pressure   [] Palpitations    [] Shortness of breath when laying flat   [] Shortness of breath with exertion. Vascular:  [x] Pain in legs with walking   [] Pain in legs at rest  [] History of DVT   [] Phlebitis   [] Swelling in legs   [] Varicose veins   [] Non-healing ulcers Pulmonary:   [] Uses home oxygen   [] Productive cough   [] Hemoptysis   [] Wheeze  [] COPD   [] Asthma Neurologic:  [] Dizziness   [] Seizures   [] History of stroke   [] History of TIA  [] Aphasia   [] Vissual changes   [] Weakness or numbness in arm   [] Weakness or numbness in leg Musculoskeletal:   [] Joint swelling   [] Joint pain   [] Low back pain Hematologic:  [] Easy bruising  [] Easy bleeding   [] Hypercoagulable state   [] Anemic Gastrointestinal:  [] Diarrhea   [] Vomiting  [x] Gastroesophageal reflux/heartburn   [] Difficulty swallowing. Genitourinary:  [] Chronic kidney disease   [] Difficult urination  [] Frequent urination   [] Blood in urine Skin:  [] Rashes   [] Ulcers  Psychological:  [] History of anxiety   []  History of major depression.  Physical Examination  There were no vitals filed for this visit. There is no height or weight on file to calculate BMI. Gen: WD/WN, NAD Head: [place]/AT, No temporalis wasting.  Ear/Nose/Throat: Hearing grossly intact, nares w/o erythema or drainage Eyes: PER, EOMI, sclera nonicteric.  Neck: Supple, no masses.  No bruit or JVD.  Pulmonary:  Good air movement, no audible wheezing, no use of accessory muscles.  Cardiac: RRR, normal S1, S2, no Murmurs. Vascular:  mild trophic changes, no open wounds Vessel Right Left  Radial Palpable Palpable  PT Not Palpable Not Palpable  DP Not Palpable Not Palpable  Gastrointestinal: soft, non-distended. No guarding/no peritoneal signs.  Musculoskeletal: M/S 5/5 throughout.  No visible deformity.  Neurologic: CN 2-12 intact. Pain and light touch intact in extremities.  Symmetrical.  Speech is fluent. Motor exam as listed above. Psychiatric: Judgment intact, Mood & affect appropriate for pt's clinical  situation. Dermatologic: No rashes or ulcers noted.  No changes consistent with cellulitis.   CBC Lab Results  Component Value Date   WBC 6.0 01/05/2023   HGB 13.2 01/05/2023   HCT 38.2 (L) 01/05/2023   MCV 88.0 01/05/2023   PLT 235 01/05/2023    BMET    Component Value Date/Time   NA 140 01/06/2023 0446   NA 141 09/10/2020 1124   K 3.8 01/06/2023 0446   CL 104 01/06/2023 0446   CO2 27 01/06/2023 0446   GLUCOSE 189 (H) 01/06/2023 0446   BUN 16 07/13/2024 0736   BUN 13 09/10/2020 1124   CREATININE 0.90 07/13/2024 0736   CALCIUM  8.3 (L) 01/06/2023 0446   GFRNONAA >60 07/13/2024 0736   GFRAA 102 09/10/2020 1124   CrCl cannot be calculated (Patient's most recent lab result is older than the maximum 21 days allowed.).  COAG Lab Results  Component Value Date   INR 1.0 07/16/2022    Radiology No results found.   Assessment/Plan There are no diagnoses linked to this encounter.   [name], MD  09/04/2024 2:29 PM      [1]  No outpatient medications have been marked as taking for the 09/05/24 encounter (Appointment) with Shawl, Cordella MATSU, MD.  [2]  Social History Tobacco Use   Smoking status: Former    Current packs/day: 0.00    Average packs/day: 1 pack/day for 35.0 years (35.0 ttl pk-yrs)    Types: Cigarettes    Start date: 28    Quit date: 2021    Years since quitting: 5.0    Passive exposure: Past   Smokeless tobacco: Never  Vaping Use   Vaping status: Never Used  Substance Use Topics   Alcohol use: Yes    Comment: rarely   Drug use: No  [3]  Allergies Allergen Reactions   Naproxen Anaphylaxis   Liraglutide Nausea Only    (Victoza)   "

## 2024-09-05 ENCOUNTER — Ambulatory Visit (INDEPENDENT_AMBULATORY_CARE_PROVIDER_SITE_OTHER): Admitting: Vascular Surgery

## 2024-09-05 ENCOUNTER — Encounter (INDEPENDENT_AMBULATORY_CARE_PROVIDER_SITE_OTHER)

## 2024-09-05 DIAGNOSIS — E785 Hyperlipidemia, unspecified: Secondary | ICD-10-CM

## 2024-09-05 DIAGNOSIS — I70211 Atherosclerosis of native arteries of extremities with intermittent claudication, right leg: Secondary | ICD-10-CM

## 2024-09-05 DIAGNOSIS — E1142 Type 2 diabetes mellitus with diabetic polyneuropathy: Secondary | ICD-10-CM

## 2024-09-05 DIAGNOSIS — I6523 Occlusion and stenosis of bilateral carotid arteries: Secondary | ICD-10-CM

## 2024-09-05 DIAGNOSIS — I10 Essential (primary) hypertension: Secondary | ICD-10-CM

## 2024-09-16 NOTE — Progress Notes (Unsigned)
 "                                                                      MRN : [id number]  Justin Adkins is a 59 y.o. ([birth year]) male who presents with chief complaint of check circulation.  History of Present Illness:   The patient returns to the office for followup and review status post angiogram with intervention on 07/13/2024.   Procedure:  Percutaneous transluminal angioplasty and stent placement right superficial femoral artery and popliteal in 2 separate locations   The patient notes improvement in the lower extremity symptoms. No interval shortening of the patient's claudication distance or rest pain symptoms. No new ulcers or wounds have occurred since the last visit.  There have been no significant changes to the patient's overall health care.  No documented history of amaurosis fugax or recent TIA symptoms. There are no recent neurological changes noted. No documented history of DVT, PE or superficial thrombophlebitis. The patient denies recent episodes of angina or shortness of breath.   ABI's Rt=*** and Lt=***  (previous ABI's Rt=*** and Lt=***) Duplex US  of the *** lower extremity arterial system shows ***  Duplex ultrasound of the carotid arteries dated 07/04/2024 demonstrates 40-59% bilateral ICA stenosis.  Active Medications[1]  Past Medical History:  Diagnosis Date   Atherosclerosis of native arteries of extremity with intermittent claudication    ED (erectile dysfunction)    Elevated liver enzymes    GERD (gastroesophageal reflux disease)    History of echocardiogram    a. 07/2020 Echo: EF 60-65%, no rwma, mild LVH. Nl RV fxn. Mildly dil LA.   History of tobacco abuse    Hyperlipidemia    Hypertension    Peripheral vascular disease    a. 05/2019 s/p PTA R popliteal/AT, mech thrombectomy to R popliteal/tibioperoneal trunk--> plavix  & eliquis  2.5 bid since; 09/2019 LE u/s: near nl study.   Popliteal artery occlusion, right    Type 2 diabetes mellitus (HCC)     Ulcerative colitis [facility])     Past Surgical History:  Procedure Laterality Date   COLONOSCOPY     COLONOSCOPY N/A 09/24/2020   Procedure: COLONOSCOPY;  Surgeon: Maryruth Ole DASEN, MD;  Location: [facility];  Service: Endoscopy;  Laterality: N/A;   COLONOSCOPY WITH PROPOFOL  N/A 11/07/2022   Procedure: COLONOSCOPY WITH PROPOFOL ;  Surgeon: Maryruth Ole DASEN, MD;  Location: [facility];  Service: Endoscopy;  Laterality: N/A;   ESOPHAGOGASTRODUODENOSCOPY (EGD) WITH PROPOFOL  N/A 02/28/2020   Procedure: ESOPHAGOGASTRODUODENOSCOPY (EGD) WITH PROPOFOL ;  Surgeon: Maryruth Ole DASEN, MD;  Location: [facility];  Service: Endoscopy;  Laterality: N/A;   FOOT SURGERY Right    HERNIA REPAIR     inguinal hernia   LOWER EXTREMITY ANGIOGRAPHY Right 05/25/2019   Procedure: LOWER EXTREMITY ANGIOGRAPHY;  Surgeon: Jama Cordella MATSU, MD;  Location: [facility];  Service: Cardiovascular;  Laterality: Right;   LOWER EXTREMITY ANGIOGRAPHY Right 07/02/2021   Procedure: LOWER EXTREMITY ANGIOGRAPHY;  Surgeon: Jama Cordella MATSU, MD;  Location: [facility];  Service: Cardiovascular;  Laterality: Right;   LOWER EXTREMITY ANGIOGRAPHY Right 07/13/2024   Procedure: Lower Extremity Angiography;  Surgeon: Jama Cordella MATSU, MD;  Location: [facility];  Service: Cardiovascular;  Laterality: Right;  LOWER EXTREMITY INTERVENTION Right 07/13/2024   Procedure: LOWER EXTREMITY INTERVENTION;  Surgeon: Jama Cordella MATSU, MD;  Location: [facility];  Service: Cardiovascular;  Laterality: Right;   TONSILLECTOMY     VASCULAR SURGERY      Social History Social History[2]  Family History Family History  Problem Relation Age of Onset   Cancer Father    Heart attack Brother        MI @ 54    Allergies[3]   REVIEW OF SYSTEMS (Negative unless checked)  Constitutional: [] Weight loss  [] Fever  [] Chills Cardiac: [] Chest pain   [] Chest pressure   [] Palpitations    [] Shortness of breath when laying flat   [] Shortness of breath with exertion. Vascular:  [x] Pain in legs with walking   [] Pain in legs at rest  [] History of DVT   [] Phlebitis   [] Swelling in legs   [] Varicose veins   [] Non-healing ulcers Pulmonary:   [] Uses home oxygen   [] Productive cough   [] Hemoptysis   [] Wheeze  [] COPD   [] Asthma Neurologic:  [] Dizziness   [] Seizures   [] History of stroke   [] History of TIA  [] Aphasia   [] Vissual changes   [] Weakness or numbness in arm   [] Weakness or numbness in leg Musculoskeletal:   [] Joint swelling   [] Joint pain   [] Low back pain Hematologic:  [] Easy bruising  [] Easy bleeding   [] Hypercoagulable state   [] Anemic Gastrointestinal:  [] Diarrhea   [] Vomiting  [x] Gastroesophageal reflux/heartburn   [] Difficulty swallowing. Genitourinary:  [] Chronic kidney disease   [] Difficult urination  [] Frequent urination   [] Blood in urine Skin:  [] Rashes   [] Ulcers  Psychological:  [] History of anxiety   []  History of major depression.  Physical Examination  There were no vitals filed for this visit. There is no height or weight on file to calculate BMI. Gen: WD/WN, NAD Head: [place]/AT, No temporalis wasting.  Ear/Nose/Throat: Hearing grossly intact, nares w/o erythema or drainage Eyes: PER, EOMI, sclera nonicteric.  Neck: Supple, no masses.  No bruit or JVD.  Pulmonary:  Good air movement, no audible wheezing, no use of accessory muscles.  Cardiac: RRR, normal S1, S2, no Murmurs. Vascular:  mild trophic changes, no open wounds Vessel Right Left  Radial Palpable Palpable  PT Not Palpable Not Palpable  DP Not Palpable Not Palpable  Gastrointestinal: soft, non-distended. No guarding/no peritoneal signs.  Musculoskeletal: M/S 5/5 throughout.  No visible deformity.  Neurologic: CN 2-12 intact. Pain and light touch intact in extremities.  Symmetrical.  Speech is fluent. Motor exam as listed above. Psychiatric: Judgment intact, Mood & affect appropriate for pt's clinical  situation. Dermatologic: No rashes or ulcers noted.  No changes consistent with cellulitis.   CBC Lab Results  Component Value Date   WBC 6.0 01/05/2023   HGB 13.2 01/05/2023   HCT 38.2 (L) 01/05/2023   MCV 88.0 01/05/2023   PLT 235 01/05/2023    BMET    Component Value Date/Time   NA 140 01/06/2023 0446   NA 141 09/10/2020 1124   K 3.8 01/06/2023 0446   CL 104 01/06/2023 0446   CO2 27 01/06/2023 0446   GLUCOSE 189 (H) 01/06/2023 0446   BUN 16 07/13/2024 0736   BUN 13 09/10/2020 1124   CREATININE 0.90 07/13/2024 0736   CALCIUM  8.3 (L) 01/06/2023 0446   GFRNONAA >60 07/13/2024 0736   GFRAA 102 09/10/2020 1124   CrCl cannot be calculated (Patient's most recent lab result is older than the maximum 21 days allowed.).  COAG Lab Results  Component Value Date   INR 1.0 07/16/2022    Radiology No results found.   Assessment/Plan There are no diagnoses linked to this encounter.   [name], MD  09/16/2024 10:35 AM      [1]  No outpatient medications have been marked as taking for the 09/19/24 encounter (Appointment) with Shawl, Cordella MATSU, MD.  [2]  Social History Tobacco Use   Smoking status: Former    Current packs/day: 0.00    Average packs/day: 1 pack/day for 35.0 years (35.0 ttl pk-yrs)    Types: Cigarettes    Start date: 42    Quit date: 2021    Years since quitting: 5.0    Passive exposure: Past   Smokeless tobacco: Never  Vaping Use   Vaping status: Never Used  Substance Use Topics   Alcohol use: Yes    Comment: rarely   Drug use: No  [3]  Allergies Allergen Reactions   Naproxen Anaphylaxis   Liraglutide Nausea Only    (Victoza)   "

## 2024-09-19 ENCOUNTER — Ambulatory Visit (INDEPENDENT_AMBULATORY_CARE_PROVIDER_SITE_OTHER): Admitting: Vascular Surgery

## 2024-09-19 ENCOUNTER — Encounter (INDEPENDENT_AMBULATORY_CARE_PROVIDER_SITE_OTHER)

## 2024-09-19 DIAGNOSIS — E1169 Type 2 diabetes mellitus with other specified complication: Secondary | ICD-10-CM

## 2024-09-19 DIAGNOSIS — I10 Essential (primary) hypertension: Secondary | ICD-10-CM

## 2024-09-19 DIAGNOSIS — E1142 Type 2 diabetes mellitus with diabetic polyneuropathy: Secondary | ICD-10-CM

## 2024-09-19 DIAGNOSIS — I6523 Occlusion and stenosis of bilateral carotid arteries: Secondary | ICD-10-CM

## 2024-09-19 DIAGNOSIS — I70223 Atherosclerosis of native arteries of extremities with rest pain, bilateral legs: Secondary | ICD-10-CM

## 2024-10-20 ENCOUNTER — Encounter (INDEPENDENT_AMBULATORY_CARE_PROVIDER_SITE_OTHER)

## 2024-10-20 ENCOUNTER — Ambulatory Visit (INDEPENDENT_AMBULATORY_CARE_PROVIDER_SITE_OTHER): Admitting: Vascular Surgery
